# Patient Record
Sex: Female | Born: 1985
Health system: Southern US, Community
[De-identification: ages and names within clinical notes are randomized; demographics above are authoritative.]

## PROBLEM LIST (undated history)

## (undated) DIAGNOSIS — R12 Heartburn: Secondary | ICD-10-CM

## (undated) DIAGNOSIS — G43909 Migraine, unspecified, not intractable, without status migrainosus: Secondary | ICD-10-CM

## (undated) DIAGNOSIS — M549 Dorsalgia, unspecified: Secondary | ICD-10-CM

## (undated) DIAGNOSIS — K760 Fatty (change of) liver, not elsewhere classified: Secondary | ICD-10-CM

## (undated) DIAGNOSIS — T783XXA Angioneurotic edema, initial encounter: Secondary | ICD-10-CM

## (undated) DIAGNOSIS — L509 Urticaria, unspecified: Secondary | ICD-10-CM

## (undated) DIAGNOSIS — O139 Gestational [pregnancy-induced] hypertension without significant proteinuria, unspecified trimester: Secondary | ICD-10-CM

## (undated) DIAGNOSIS — S8290XA Unspecified fracture of unspecified lower leg, initial encounter for closed fracture: Secondary | ICD-10-CM

## (undated) DIAGNOSIS — R112 Nausea with vomiting, unspecified: Secondary | ICD-10-CM

## (undated) DIAGNOSIS — Z9889 Other specified postprocedural states: Secondary | ICD-10-CM

## (undated) HISTORY — DX: Morbid (severe) obesity due to excess calories: E66.01

## (undated) HISTORY — DX: Heartburn: R12

## (undated) HISTORY — DX: Fatty (change of) liver, not elsewhere classified: K76.0

## (undated) HISTORY — DX: Urticaria, unspecified: L50.9

## (undated) HISTORY — DX: Angioneurotic edema, initial encounter: T78.3XXA

## (undated) HISTORY — DX: Dorsalgia, unspecified: M54.9

## (undated) HISTORY — DX: Migraine, unspecified, not intractable, without status migrainosus: G43.909

## (undated) HISTORY — DX: Unspecified fracture of unspecified lower leg, initial encounter for closed fracture: S82.90XA

---
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
## 2006-10-09 ENCOUNTER — Emergency Department: Payer: Self-pay | Admitting: Emergency Medicine

## 2006-10-09 IMAGING — CR DG CHEST 2V
1 series · 2 of 2 positions shown · non-contrast
Comparison: none

REASON FOR EXAM: Cough, fever
COMMENTS:  LMP: 2 weeks

PROCEDURE:     DXR - DXR CHEST PA (OR AP) AND LATERAL  - [DATE]  [DATE]
RESULT:     The lungs are clear.  Cardiovascular structures are
unremarkable.

[Series 1: view not recorded · 0.17mm/px · 2 of 2 slices shown]
[im 1/2]
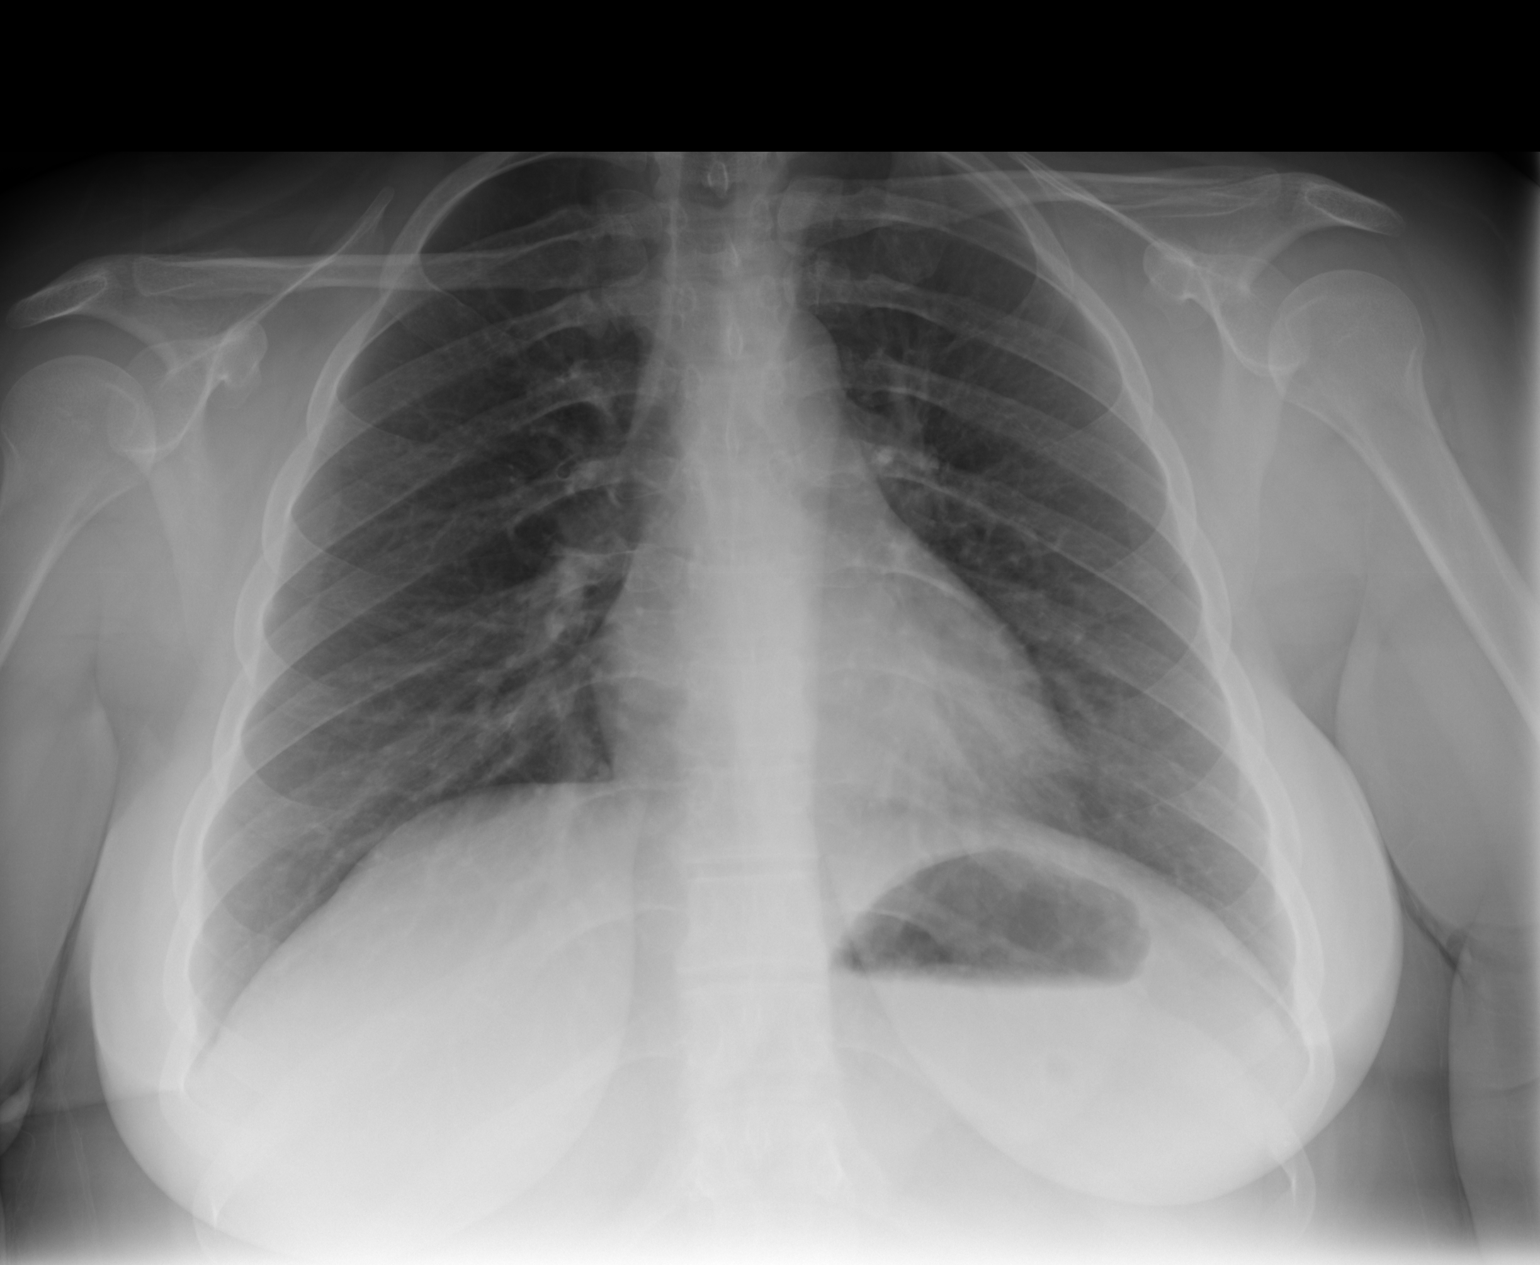
[im 2/2]
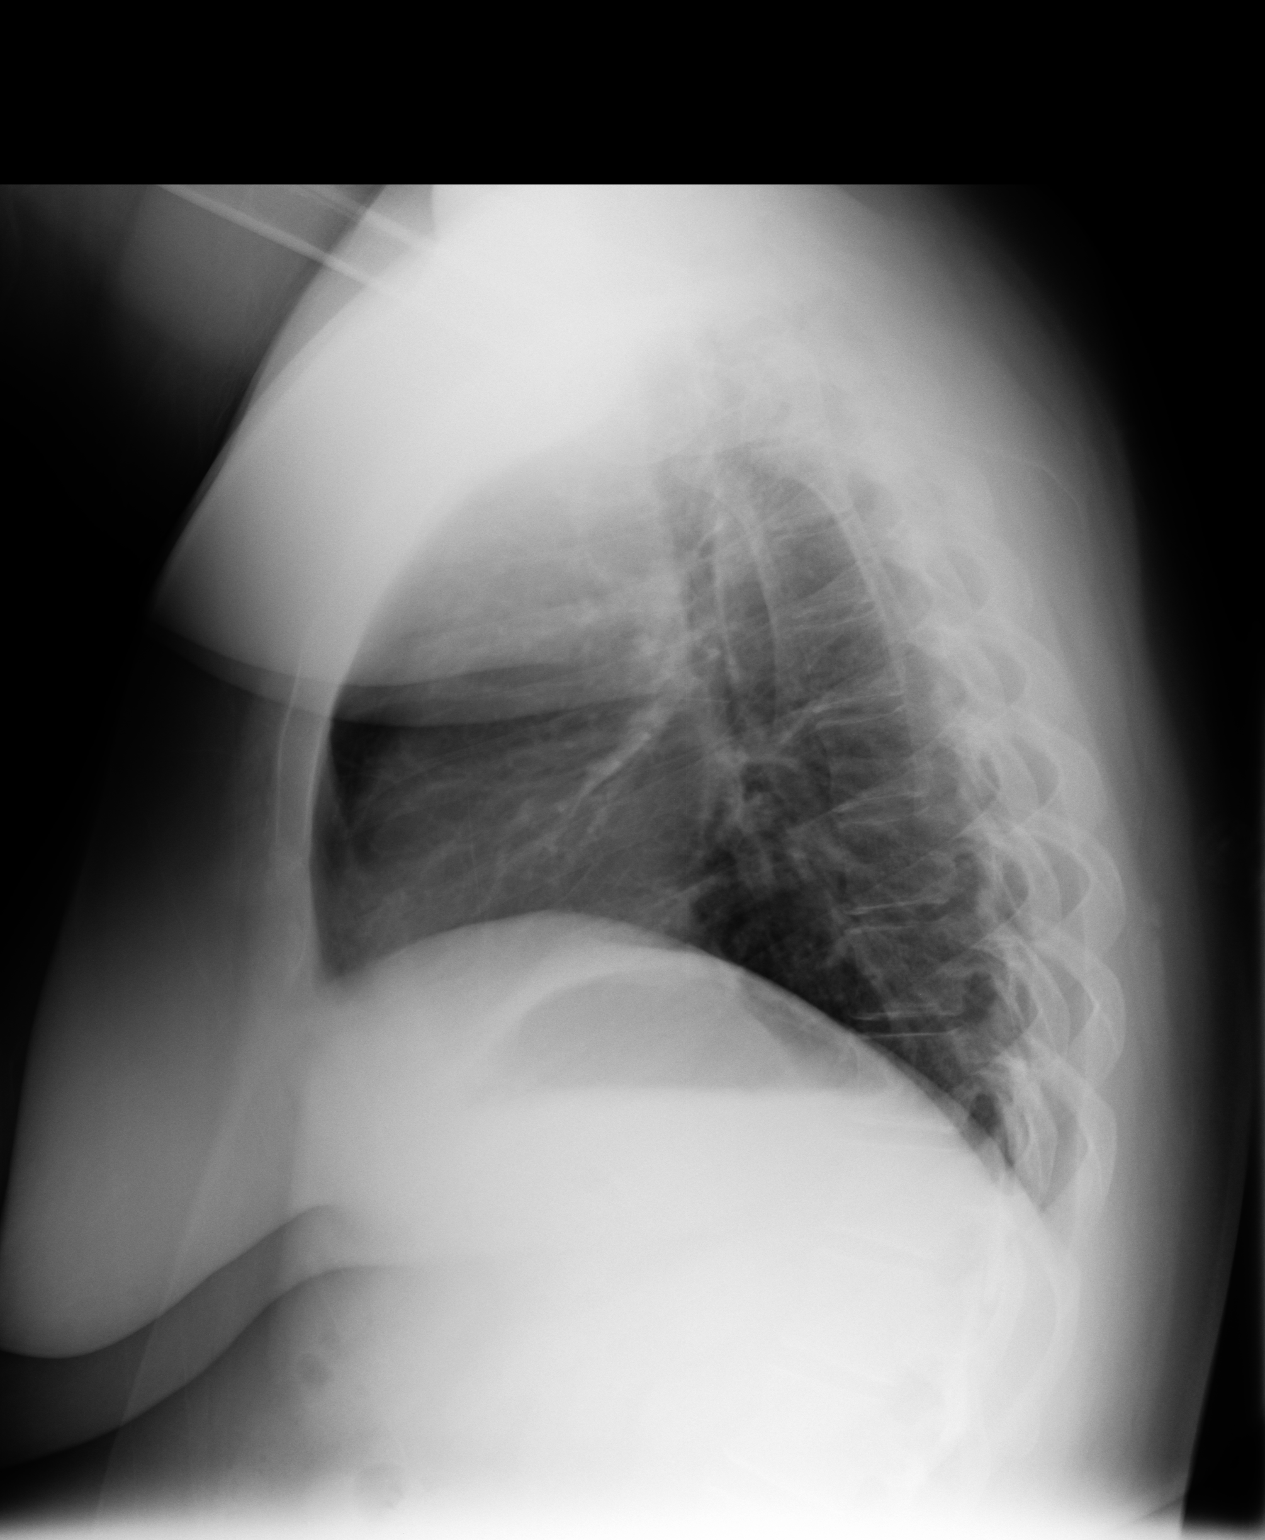

[2 of 2 positions shown; findings below may reference images not displayed]

IMPRESSION: No acute cardiopulmonary disease.

## 2007-09-01 ENCOUNTER — Encounter: Admission: RE | Admit: 2007-09-01 | Discharge: 2007-09-01 | Payer: Self-pay | Admitting: Obstetrics and Gynecology

## 2009-07-19 ENCOUNTER — Emergency Department (HOSPITAL_COMMUNITY): Admission: EM | Admit: 2009-07-19 | Discharge: 2009-07-19 | Payer: Self-pay | Admitting: Family Medicine

## 2010-07-21 HISTORY — PX: CHOLECYSTECTOMY: SHX55

## 2010-12-06 NOTE — Op Note (Signed)
[place]  Patient:    Alisha Wood, Alisha Wood Visit Number: [id number] MRN: [id number]          Service Type: PED Location: PEDS 6151 01 Attending Physician:  Carlos Levering Dictated by:   Hyman Bible Pendse, M.D. Proc. Date: 08/16/01 Admit Date:  08/15/2001   CC:         [facility]   Operative Report  PREOPERATIVE DIAGNOSIS:  Acute appendicitis.  POSTOPERATIVE DIAGNOSIS:  Acute appendicitis.  OPERATION PERFORMED: 1. Exploratory laparotomy. 2. Appendectomy.  SURGEON:  Prabhakar D. Levie Heritage, M.D.  ASSISTANT:  Nurse.  ANESTHESIA:  Nurse.  INDICATIONS FOR PROCEDURE:  The patient is a 25 year old girl who was seen with 24 hours history of progressively worse lower abdominal pains associated with nausea, anorexia, no upper respiratory infection, no dysuria, no diarrhea.  White blood cell count was 10,800; however, CT scan of the abdomen showed findings consistent with acute appendicitis.  Exploration was planned.  OPERATIVE FINDINGS:  Upon opening the right lower quadrant area, there was a moderate amount of serous fluid in the right lower quadrant area.  With difficulty, the appendix was exteriorized which was about 4 inches long with its distal one inch somewhat congested suggestive of early appendicitis and no other pathological changes were seen.  DESCRIPTION OF PROCEDURE:  Under satisfactory general endotracheal anesthesia, patient in supine position, the abdomen was thoroughly prepped and draped in the usual manner.  About a 3-inch long transverse incision was made in the right lower quadrant area.  The patient was quite obese, so there was about a 2 inch thickness of the subcutaneous tissues which was incised.  Muscles were incised in the McBurney fashion.  Peritoneal cavity entered with difficulty. The cecum and the appendix were identified.  The findings were as described above.  The appendix  mesentery was serially clamped, cut and ligated with 2-0 silk.  Appendectomy was done.  The stump was suture ligated with 3-0 silk and 2 Hemoclips were placed.  Stump was not buried in the cecal wall.  Hemostasis was satisfactory.  Cecum returned to the peritoneal cavity.  Irrigation was carried out.  Hemostasis being satisfactory and sponge and needle count being correct, peritoneum closed with 2-0 Vicryl running interlocking sutures. Wound was irrigated, individual muscles with 2-0 Vicryl interrupted sutures, subcutaneous tissues with 2-0 chromic interrupted sutures.  Skin closed with 2-0 Monocryl subcuticular sutures.  0.25% Marcaine with epinephrine was injected locally for postoperative analgesia.  Steri-Strips were applied. Appropriate dressing applied.  Throughout the procedure, the patients vital signs remained stable.  The patient withstood the procedure well and was transferred to the recovery room in satisfactory general condition. Dictated by:   Hyman Bible Pendse, M.D. Attending Physician:  Carlos Levering DD:  08/16/01 TD:  08/16/01 Job: [id number]

## 2011-01-06 ENCOUNTER — Other Ambulatory Visit (INDEPENDENT_AMBULATORY_CARE_PROVIDER_SITE_OTHER): Payer: Self-pay | Admitting: General Surgery

## 2011-01-06 ENCOUNTER — Ambulatory Visit (HOSPITAL_COMMUNITY)
Admission: RE | Admit: 2011-01-06 | Discharge: 2011-01-06 | Disposition: A | Discharge status: Home / Self Care | Payer: 59 | Source: Ambulatory Visit | Attending: General Surgery | Admitting: General Surgery

## 2011-01-06 DIAGNOSIS — K8020 Calculus of gallbladder without cholecystitis without obstruction: Secondary | ICD-10-CM | POA: Insufficient documentation

## 2011-01-06 DIAGNOSIS — R1011 Right upper quadrant pain: Secondary | ICD-10-CM | POA: Insufficient documentation

## 2011-01-06 IMAGING — US US ABDOMEN COMPLETE
1 series · 14 of 25 positions shown · non-contrast
Comparison: [DATE]

CLINICAL DATA: Right upper quadrant pain

COMPLETE ABDOMINAL ULTRASOUND

[Series 1: us abdomen complete · 0.30mm/px · 14 of 70 slices shown]
[im 1/70]
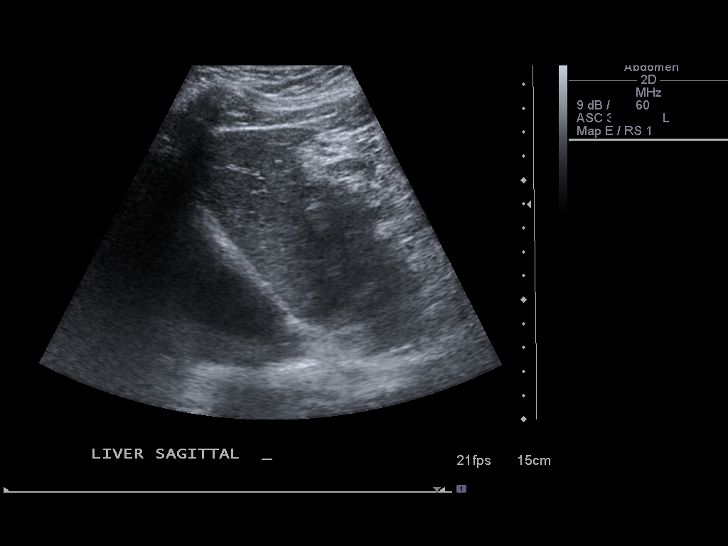
[im 6/70]
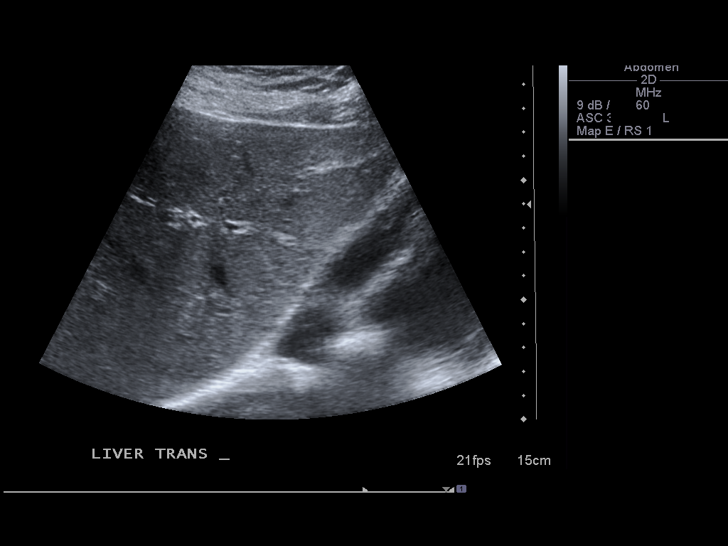
[im 12/70]
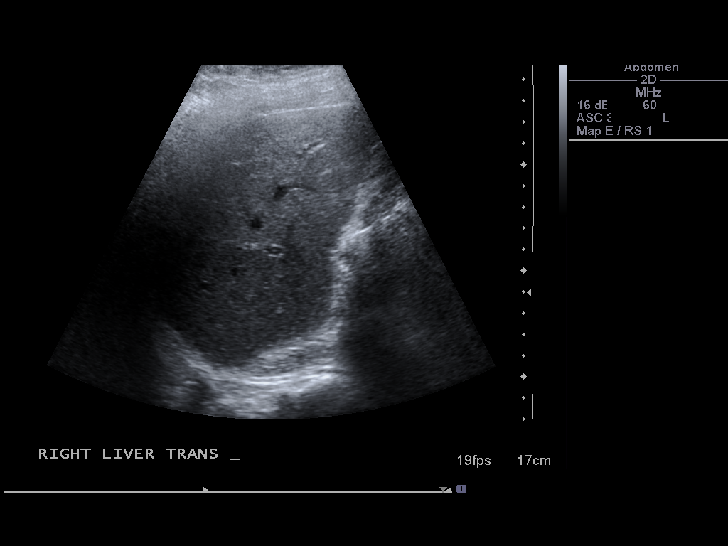
[im 18/70]
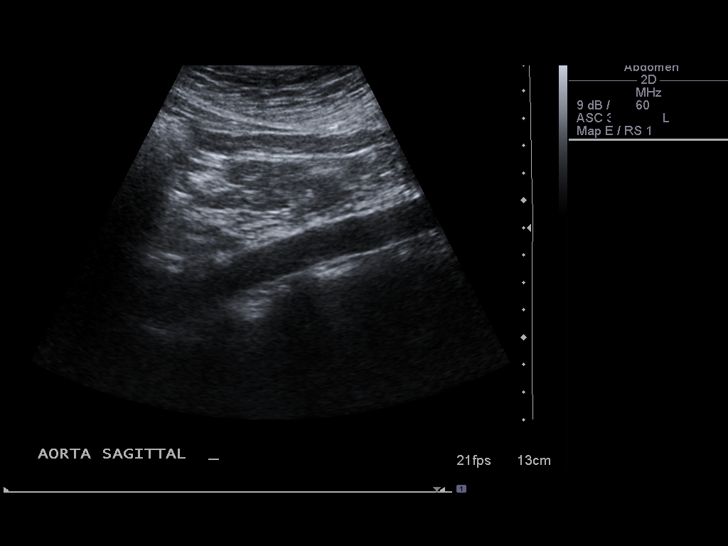
[im 24/70]
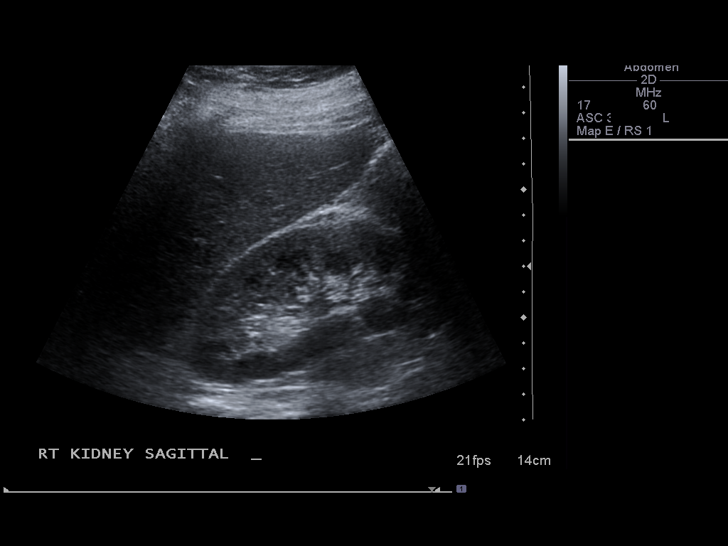
[im 26/70]
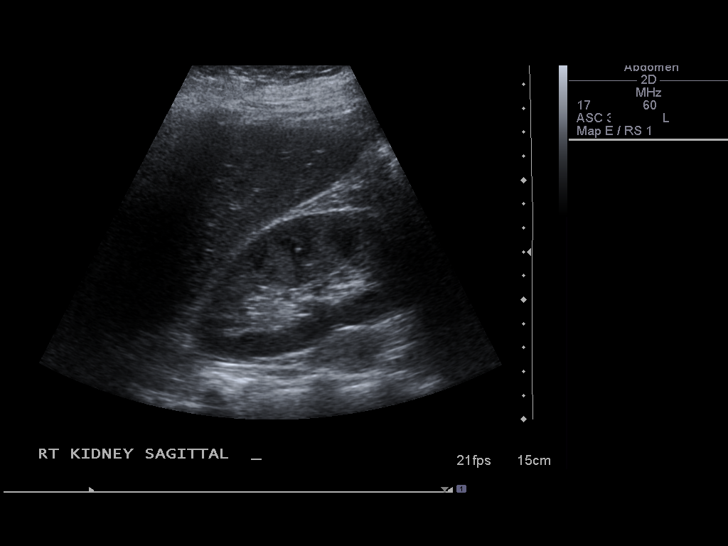
[im 32/70]
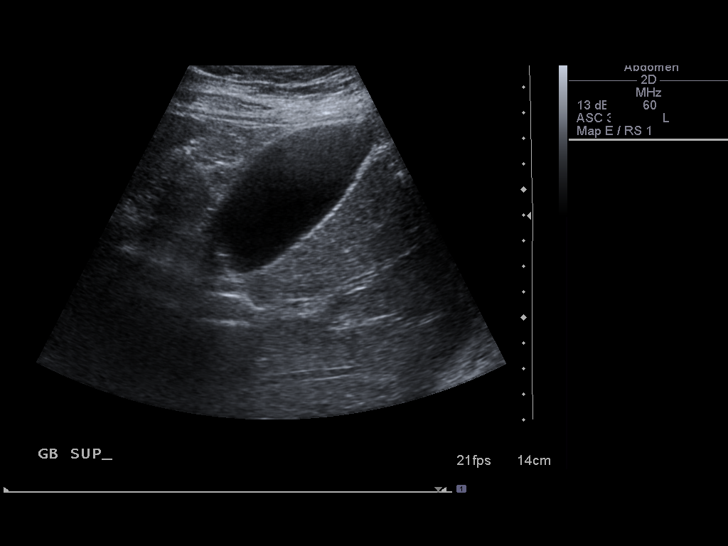
[im 38/70]
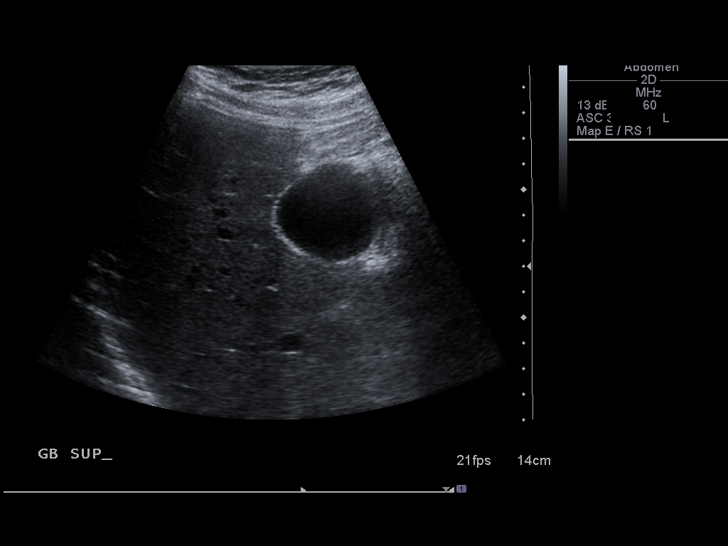
[im 44/70]
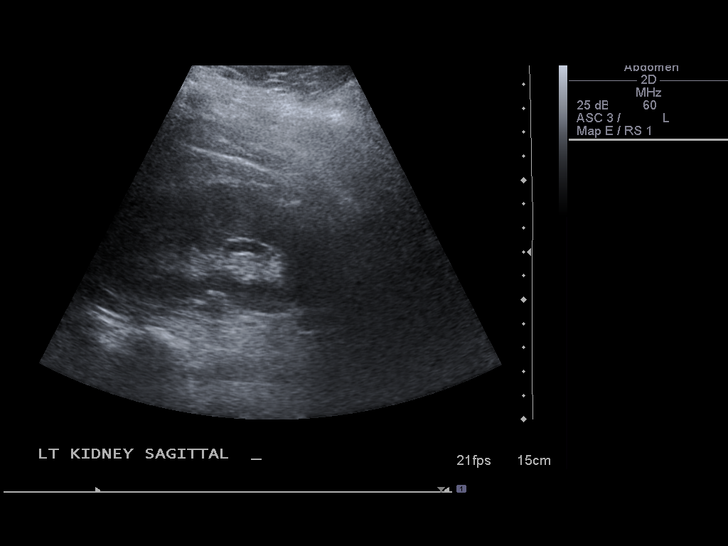
[im 47/70]
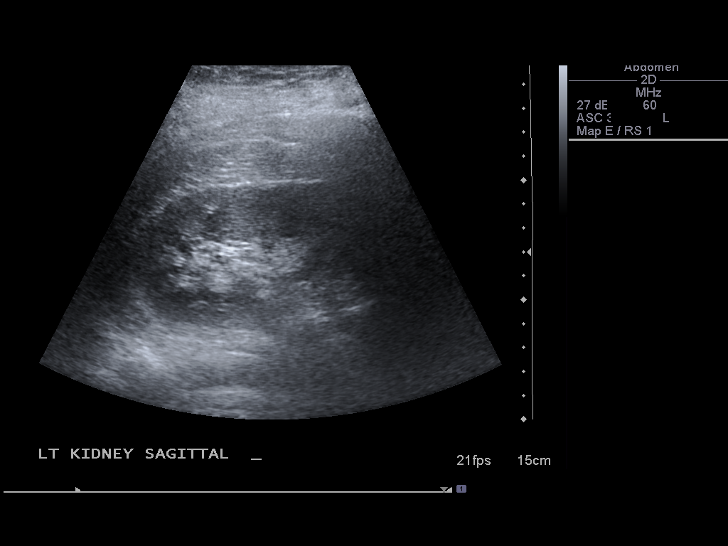
[im 52/70]
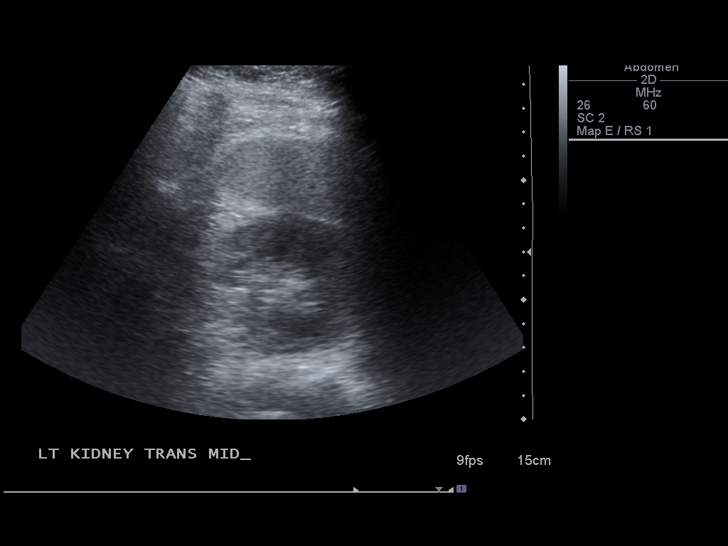
[im 58/70]
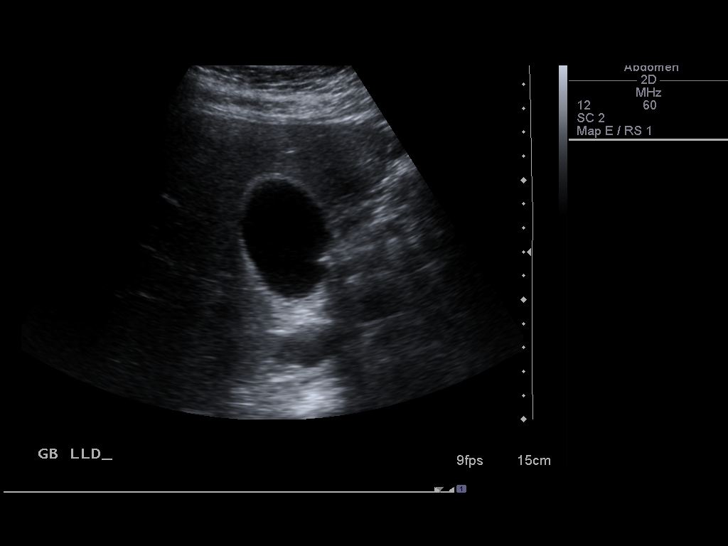
[im 64/70]
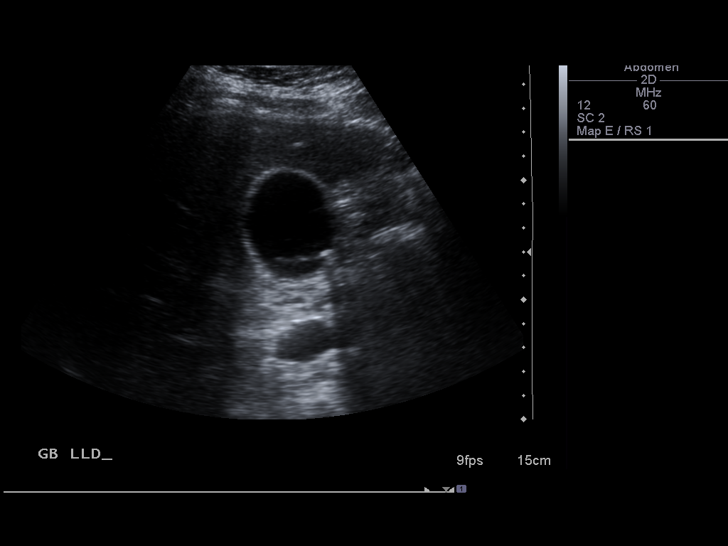
[im 70/70]
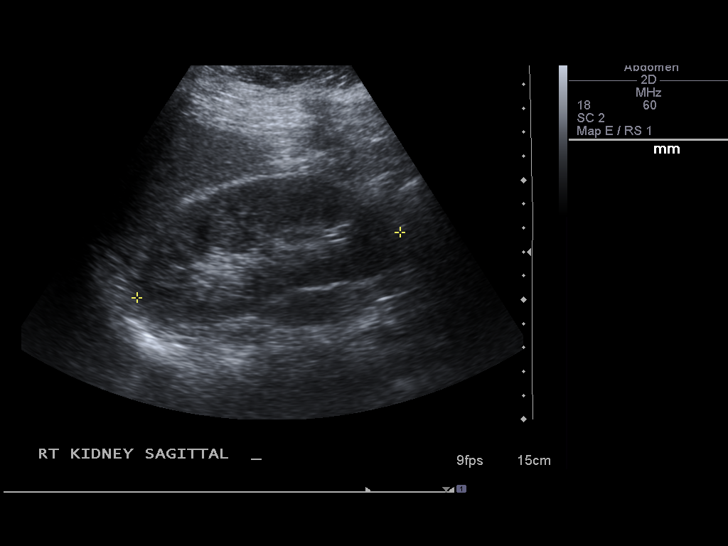

[14 of 25 positions shown; findings below may reference images not displayed]

FINDINGS: Gallbladder:  There are multiple small gallstones.  No gallbladder
wall thickening or edema.  Negative sonographic Murphy's sign.

Common bile duct:  4 mm

Liver:  No focal lesion identified.  Within normal limits in
parenchymal echogenicity.

IVC:  Normal

Pancreas:  Normal

Spleen:  Normal

Right Kidney:  8.3 cm in length.  Normal.

Left Kidney:  12.6 cm in length.  Normal.

Abdominal aorta:  Normal
IMPRESSION: 1.  Cholelithiasis without sonographic evidence for acute
cholecystitis.
2.  Otherwise normal.

## 2011-01-12 ENCOUNTER — Other Ambulatory Visit (INDEPENDENT_AMBULATORY_CARE_PROVIDER_SITE_OTHER): Payer: Self-pay | Admitting: General Surgery

## 2011-01-12 NOTE — Progress Notes (Deleted)
Subjective:    Patient ID: Alisha Wood is a 25 y.o. female.  There were no vitals taken for this visit.   Chief Complaint: HPI {Common ambulatory SmartLinks:19316} Review of Systems  Objective:  Physical Exam  Constitutional: She appears well-nourished.  HENT:  Head: Normocephalic and atraumatic.  Eyes: Pupils are equal, round, and reactive to light.    Lab Review:  {Recent labs:19471::"not applicable"}  Assessment:  Assessment Diagnosis No diagnosis found.   Plan:  Plan ***

## 2011-01-16 ENCOUNTER — Encounter (INDEPENDENT_AMBULATORY_CARE_PROVIDER_SITE_OTHER): Payer: Self-pay | Admitting: General Surgery

## 2011-01-16 ENCOUNTER — Ambulatory Visit (INDEPENDENT_AMBULATORY_CARE_PROVIDER_SITE_OTHER): Payer: 59 | Admitting: General Surgery

## 2011-01-16 VITALS — BP 136/84 | HR 88 | Ht 62.0 in | Wt 202.6 lb

## 2011-01-16 DIAGNOSIS — K8010 Calculus of gallbladder with chronic cholecystitis without obstruction: Secondary | ICD-10-CM | POA: Insufficient documentation

## 2011-01-16 NOTE — Progress Notes (Signed)
Subjective:     Patient ID: Alisha Wood, female   DOB: Aug 01, 1985, 25 y.o.   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  Saw the patient home on normal rounds at the hospital when she the synovitis concerning abdominal pain she had been having in her epigastrium upper back and right upper quadrant. A rolled her to get an ultrasound examination done of the abdomen which demonstrated cholelithiasis but no evidence of acute cholecystitis. She comes in now for evaluation for cholecystectomy  Review of Systems  Constitutional: Negative.   HENT: Negative.   Gastrointestinal: Positive for abdominal pain and diarrhea.       Objective:   Physical Exam  Constitutional: She is oriented to person, place, and time. She appears well-developed and well-nourished.  HENT:  Head: Normocephalic and atraumatic.  Eyes: EOM are normal. Pupils are equal, round, and reactive to light.  Neck: Normal range of motion. Neck supple.  Cardiovascular: Normal rate and regular rhythm.   Murmur (Grade I at LLSB) heard. Pulmonary/Chest: Effort normal and breath sounds normal.  Abdominal: Soft. There is tenderness (Epigastric and RUQ).  Musculoskeletal: Normal range of motion.  Neurological: She is alert and oriented to person, place, and time. She has normal reflexes.  Psychiatric: She has a normal mood and affect. Her behavior is normal. Judgment and thought content normal.       Assessment:    This is a 25 year old with symptomatic cholelithiasis. She does not have acute cholecystitis    Plan:     We will go ahead and schedule the patient for laparoscopic cholecystectomy. The risks and benefits have been explained to the patient and she understands we will proceed to surgery soon as possible she will get the normal preoperative  antibiotics and likely done as an outpatient.

## 2011-01-30 ENCOUNTER — Ambulatory Visit (INDEPENDENT_AMBULATORY_CARE_PROVIDER_SITE_OTHER): Payer: Self-pay | Admitting: General Surgery

## 2011-01-31 ENCOUNTER — Other Ambulatory Visit (HOSPITAL_COMMUNITY): Payer: 59

## 2011-01-31 ENCOUNTER — Encounter (HOSPITAL_COMMUNITY)
Admission: RE | Admit: 2011-01-31 | Discharge: 2011-01-31 | Disposition: A | Discharge status: Home / Self Care | Payer: 59 | Source: Ambulatory Visit | Attending: General Surgery | Admitting: General Surgery

## 2011-01-31 LAB — HCG, SERUM, QUALITATIVE: Preg, Serum: NEGATIVE

## 2011-01-31 LAB — DIFFERENTIAL
Basophils Relative: 0 % (ref 0–1)
Eosinophils Absolute: 0.1 10*3/uL (ref 0.0–0.7)
Eosinophils Relative: 1 % (ref 0–5)
Lymphs Abs: 1.9 10*3/uL (ref 0.7–4.0)
Monocytes Absolute: 0.4 10*3/uL (ref 0.1–1.0)
Monocytes Relative: 6 % (ref 3–12)

## 2011-01-31 LAB — CBC
MCH: 31.7 pg (ref 26.0–34.0)
MCHC: 35.1 g/dL (ref 30.0–36.0)
MCV: 90.4 fL (ref 78.0–100.0)
Platelets: 210 10*3/uL (ref 150–400)
RDW: 12.9 % (ref 11.5–15.5)

## 2011-01-31 LAB — COMPREHENSIVE METABOLIC PANEL
ALT: 17 U/L (ref 0–35)
AST: 14 U/L (ref 0–37)
Alkaline Phosphatase: 46 U/L (ref 39–117)
GFR calc Af Amer: >60 mL/min (ref >60)
Glucose, Bld: 82 mg/dL (ref 70–99)
Potassium: 4.5 mEq/L (ref 3.5–5.1)
Sodium: 139 mEq/L (ref 135–145)
Total Protein: 7.1 g/dL (ref 6.0–8.3)

## 2011-02-03 ENCOUNTER — Ambulatory Visit (HOSPITAL_COMMUNITY)
Admission: RE | Admit: 2011-02-03 | Discharge: 2011-02-03 | Disposition: A | Discharge status: Home / Self Care | Payer: 59 | Source: Ambulatory Visit | Attending: General Surgery | Admitting: General Surgery

## 2011-02-03 ENCOUNTER — Other Ambulatory Visit (INDEPENDENT_AMBULATORY_CARE_PROVIDER_SITE_OTHER): Payer: Self-pay | Admitting: General Surgery

## 2011-02-03 ENCOUNTER — Ambulatory Visit (HOSPITAL_COMMUNITY): Payer: 59

## 2011-02-03 DIAGNOSIS — Z01812 Encounter for preprocedural laboratory examination: Secondary | ICD-10-CM | POA: Insufficient documentation

## 2011-02-03 DIAGNOSIS — K8010 Calculus of gallbladder with chronic cholecystitis without obstruction: Secondary | ICD-10-CM

## 2011-02-03 DIAGNOSIS — M26609 Unspecified temporomandibular joint disorder, unspecified side: Secondary | ICD-10-CM | POA: Insufficient documentation

## 2011-02-03 IMAGING — RF DG CHOLANGIOGRAM OPERATIVE
1 series · 4 of 4 positions shown · non-contrast
Comparison: None

CLINICAL DATA: Cholelithiasis

INTRAOPERATIVE CHOLANGIOGRAM
TECHNIQUE: Cholangiographic images from the C-arm fluoroscopic
device were submitted for interpretation post-operatively.  Please
see the procedural report for the amount of contrast and the
fluoroscopy time utilized.

[Series 1: run · 4 of 113 frames shown]
[frame 17/113]
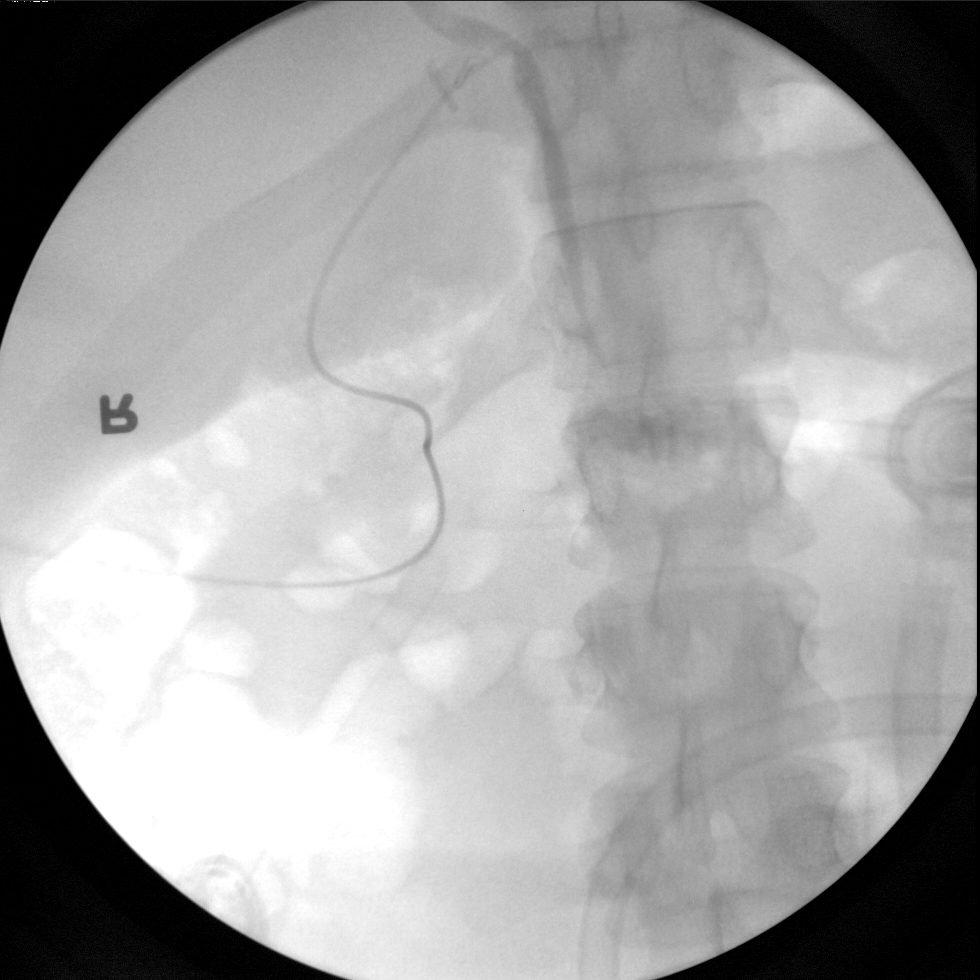
[frame 57/113]
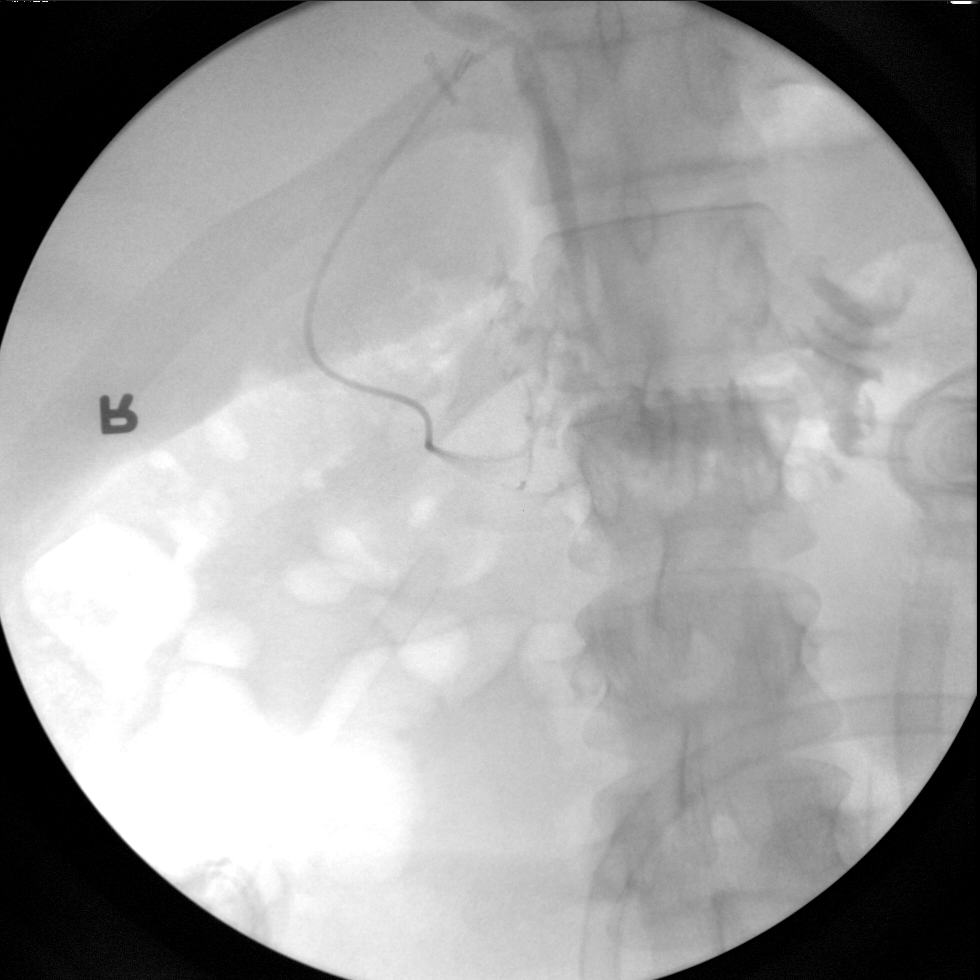
[frame 97/113]
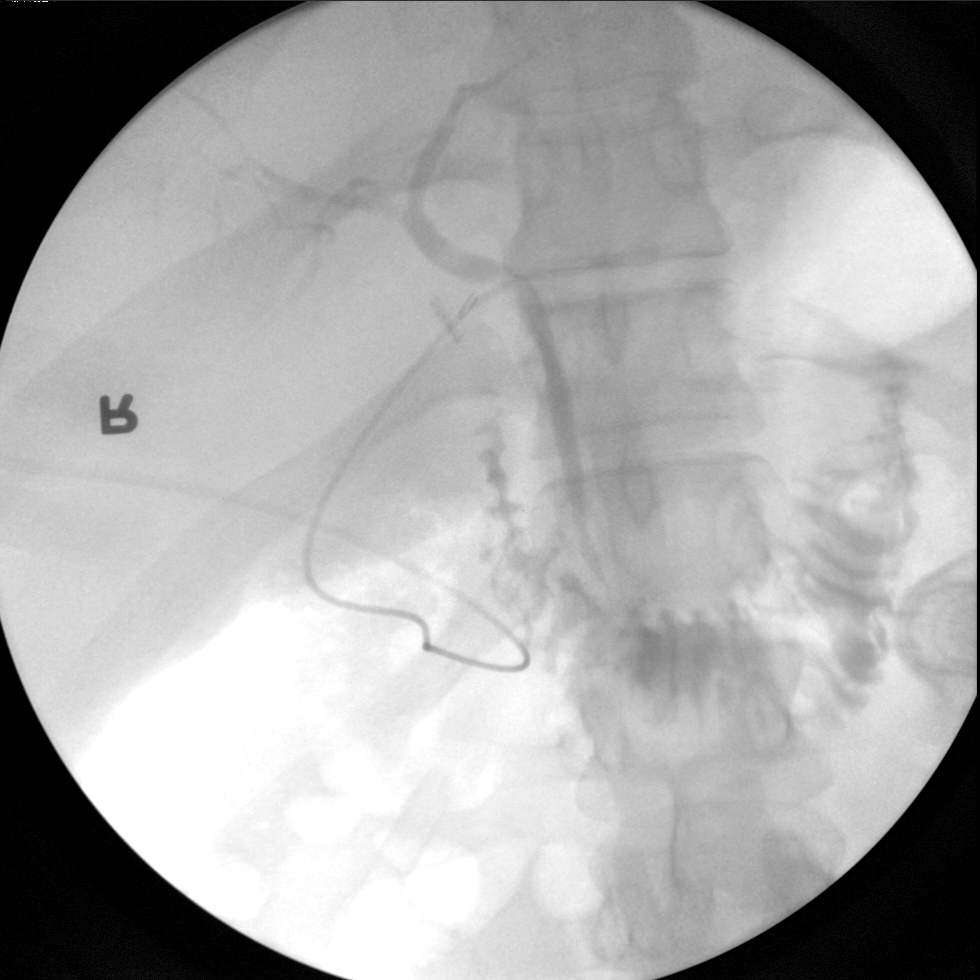
[frame 109/113]
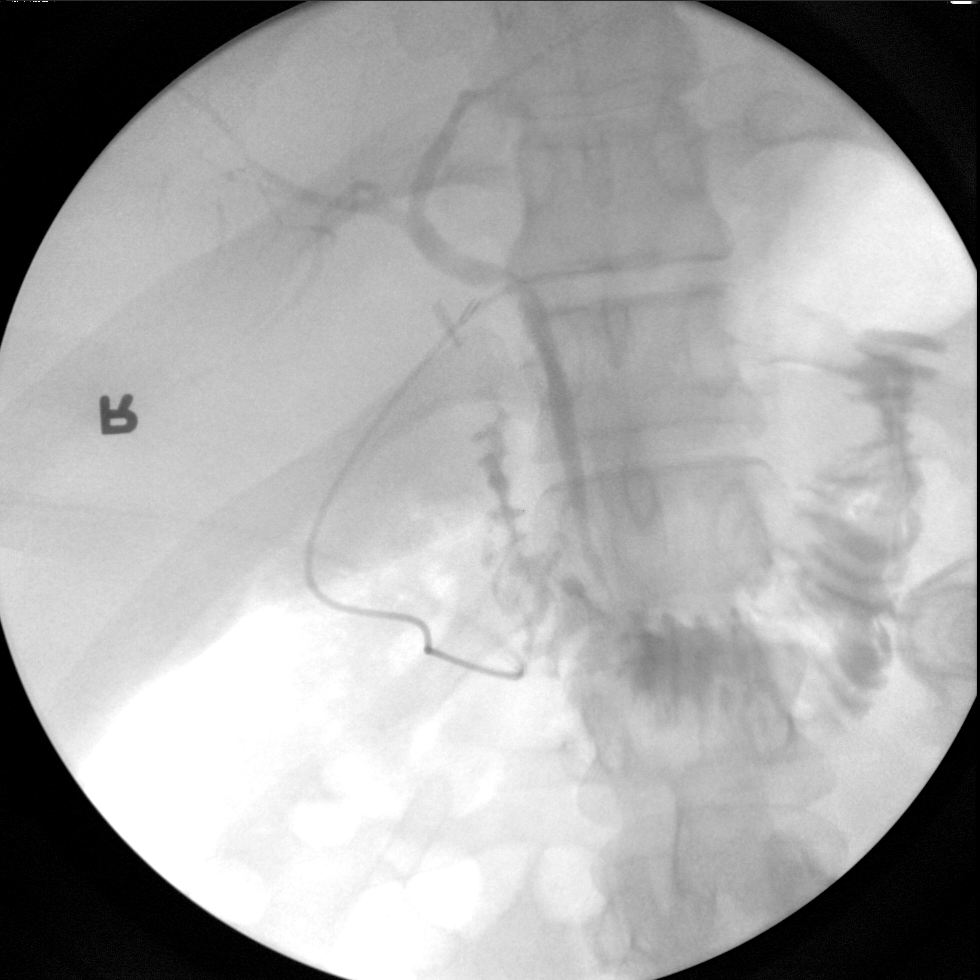

[4 of 4 positions shown; findings below may reference images not displayed]

FINDINGS: No persistent filling defects in the common duct.
Intrahepatic ducts are incompletely visualized, appearing
decompressed centrally. Contrast passes into the duodenum.

IMPRESSION

Negative for retained common duct stone.

## 2011-02-05 ENCOUNTER — Other Ambulatory Visit (INDEPENDENT_AMBULATORY_CARE_PROVIDER_SITE_OTHER): Payer: Self-pay | Admitting: General Surgery

## 2011-02-05 DIAGNOSIS — R110 Nausea: Secondary | ICD-10-CM

## 2011-02-07 NOTE — Op Note (Signed)
NAMEMarland Kitchen  VUNG, KUSH NO.:  [id number]  MEDICAL RECORD NO.:  [id number]  LOCATION:  [facility]                         FACILITY:  [facility]  PHYSICIAN:  Cherylynn Ridges, M.D.    DATE OF BIRTH:  01/26/1986  DATE OF PROCEDURE:  02/03/2011 DATE OF DISCHARGE:  02/03/2011                              OPERATIVE REPORT   PREOPERATIVE DIAGNOSIS:  Symptomatic cholelithiasis.  POSTOPERATIVE DIAGNOSIS:  Symptomatic cholelithiasis with chronic cholecystitis.  PROCEDURE:  Laparoscopic cholecystectomy with cholangiogram.  SURGEON:  Marta Lamas. Lindie Spruce, MD  ASSISTANT:  Magnus Ivan, RNFA  ANESTHESIA:  General endotracheal.  ESTIMATED BLOOD LOSS:  Less than 20 mL.  COMPLICATIONS:  None.  CONDITION:  Stable.  FINDINGS:  Normal intraoperative cholangiogram with evidence of chronic cholecystitis.  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  OPERATION IN DETAIL:  The patient was taken to the operating room and placed on the table in supine position.  After an adequate general endotracheal anesthetic was administered, she was prepped and draped in the usual sterile manner exposing the entire abdomen.  After proper time-out was performed identifying the patient and the procedure to be performed, a supraumbilical midline incision was made using #15 blade and taken down to the midline fascia.  We grabbed the fascia with Kocher clamps and then incised between the clamps using a #15 blade down to the preperitoneal space.  We then bluntly dissected down into the peritoneal cavity using a Kelly clamp.  We grabbed the edges of the fascia with Kocher clamps and passed a pursestring suture around the fascial opening and then passed a Hasson  cannula into the peritoneal cavity.  Once this was done, carbon dioxide gas was insufflated into the peritoneal cavity up to maximal pressure of 50 mmHg.  The patient was placed in reverse Trendelenburg and the left-side was tilted down.  Two right upper quadrant 5-mm cannulas and a subxiphoid 5- mm cannula passed under direct vision.  With all cannulas in place, the dissection was begun.  We retracted the gallbladder towards the anterior abdominal wall and right upper quadrant.  We opened up the peritoneum overlying the triangle of Calot and hepatic duodenal triangle using a second grasper on the infundibulum.  We were able to get a circumferential window around the cystic duct and the cystic artery.  We clipped along the gallbladder side at the cystic duct.  We made cholecystodochotomy using laparoscopic scissors.  It was through that cholecystodochotomy that a cholangiogram was performed using a Cook catheter which had been passed through the anterior abdominal wall.  The cholangiogram showed good flow into the duodenum, no intraductal filling defects, no ductal dilatation, and good proximal filling.  Once cholangiogram was completed, the clip holding the cholangiogram and the catheter in place was removed, removed the clip and distally clipped the cystic duct x2.  We transected the cystic duct, found our cystic artery which was circumferentially dissected around and clipped it proximally and distally and transected it.  Once this was completed, we dissected  out the gallbladder from its bed with minimal difficulty. Once there was entrance into the gallbladder with spillage of bile, we were able to retrieve the gallbladder with minimal difficulty from the umbilical site.  All counts were correct at that point.  We aspirated all fluid and gas from above the liver.  We closed off the supraumbilical fascial site using a pursestring suture which was in place.  Once that was done, we  irrigated with saline solution, aspirated all fluid and gas, and then closed again with the fascia being closed using a pursestring suture in place.  We injected 0.25% Marcaine with epinephrine at all sites.  We closed the supraumbilical skin using running subcuticular stitch of 4-0 Monocryl.  Once that was done, all of the incisions were closed with Dermabond and Steri-Strips and Tegaderm. All needle, sponge counts, and instrument counts were correct.     Cherylynn Ridges, M.D.     JOW/MEDQ  D:  02/03/2011  T:  02/04/2011  Job:  [id number]  Electronically Signed by Jimmye Norman M.D. on 02/07/2011 12:00:48 AM

## 2011-02-11 ENCOUNTER — Ambulatory Visit (INDEPENDENT_AMBULATORY_CARE_PROVIDER_SITE_OTHER): Payer: 59 | Admitting: General Surgery

## 2011-02-11 ENCOUNTER — Encounter (INDEPENDENT_AMBULATORY_CARE_PROVIDER_SITE_OTHER): Payer: Self-pay | Admitting: General Surgery

## 2011-02-11 DIAGNOSIS — Z09 Encounter for follow-up examination after completed treatment for conditions other than malignant neoplasm: Secondary | ICD-10-CM | POA: Insufficient documentation

## 2011-02-11 NOTE — Progress Notes (Signed)
HPI The patient is status post laparoscopic cholecystectomy. Her main complaint has been nausea but no vomiting. Today she feels better she had some surgery.  PE Wounds are healing well. There is still cover were plastic and Steri-Strips. She can remove them at her discretion.  Studiy review There are no studies to review.  Assessment Doing better status post laparoscopic cholecystectomy.  Plan She'll return to work on Monday without any heavy lifting for an additional 2 weeks.

## 2011-02-28 MED ORDER — PROMETHAZINE HCL 12.5 MG PO TABS: 12.5000 mg | ORAL_TABLET | ORAL | Status: AC | PRN

## 2011-02-28 NOTE — Progress Notes (Signed)
Addended by: Wilder Glade on: 02/28/2011 02:49 PM   Modules accepted: Orders

## 2011-06-19 ENCOUNTER — Ambulatory Visit (INDEPENDENT_AMBULATORY_CARE_PROVIDER_SITE_OTHER): Payer: 59 | Admitting: Family Medicine

## 2011-06-19 ENCOUNTER — Encounter: Payer: Self-pay | Admitting: Family Medicine

## 2011-06-19 VITALS — BP 124/80 | HR 72 | Temp 98.8°F | Ht 62.0 in | Wt 202.2 lb

## 2011-06-19 DIAGNOSIS — G43909 Migraine, unspecified, not intractable, without status migrainosus: Secondary | ICD-10-CM

## 2011-06-19 DIAGNOSIS — Z0000 Encounter for general adult medical examination without abnormal findings: Secondary | ICD-10-CM

## 2011-06-19 LAB — BASIC METABOLIC PANEL
Calcium: 9.1 mg/dL (ref 8.4–10.5)
Chloride: 109 mEq/L (ref 96–112)
Creatinine, Ser: 0.6 mg/dL (ref 0.4–1.2)
GFR: 123.38 mL/min (ref >60.00)

## 2011-06-19 LAB — LIPID PANEL
HDL: 47.3 mg/dL (ref >39.00)
LDL Cholesterol: 127 mg/dL — ABNORMAL HIGH (ref 0–99)
Total CHOL/HDL Ratio: 4
Triglycerides: 68 mg/dL (ref 0.0–149.0)
VLDL: 13.6 mg/dL (ref 0.0–40.0)
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
## 2011-06-19 NOTE — Patient Instructions (Addendum)
Bring copy of immunization record. Blood work today (cholesterol and sugar). Good to meet you today, call us with questions. Return in 1-2 years for follow up or as needed.

## 2011-06-19 NOTE — Assessment & Plan Note (Signed)
Reviewed preventative protocols, updated. Pt to bring me copy of immunization record for chart. Currently planning to restart weight watchers after holiday. Has lost 50+ lbs in last year. Discussed healthy diet.

## 2012-08-05 ENCOUNTER — Telehealth: Payer: Self-pay

## 2012-08-05 DIAGNOSIS — IMO0001 Reserved for inherently not codable concepts without codable children: Secondary | ICD-10-CM

## 2012-08-05 NOTE — Telephone Encounter (Signed)
Placed order in chart.  Will take 3-5 days to return. Does she need Hep B and MMR titers?

## 2012-08-05 NOTE — Telephone Encounter (Signed)
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

## 2012-08-05 NOTE — Telephone Encounter (Signed)
Patient notified. She is still trying to get records from previous physician. They are in storage. She will call to schedule lab appt if she is unable to get records. She does not need Hep B or MMR titers.

## 2012-08-30 ENCOUNTER — Ambulatory Visit (INDEPENDENT_AMBULATORY_CARE_PROVIDER_SITE_OTHER): Payer: 59 | Admitting: Family Medicine

## 2012-08-30 ENCOUNTER — Encounter: Payer: Self-pay | Admitting: Family Medicine

## 2012-08-30 VITALS — BP 126/78 | HR 88 | Temp 98.4°F | Wt 246.0 lb

## 2012-08-30 DIAGNOSIS — E669 Obesity, unspecified: Secondary | ICD-10-CM

## 2012-08-30 DIAGNOSIS — L989 Disorder of the skin and subcutaneous tissue, unspecified: Secondary | ICD-10-CM

## 2012-08-30 DIAGNOSIS — D2320 Other benign neoplasm of skin of unspecified ear and external auricular canal: Secondary | ICD-10-CM | POA: Insufficient documentation

## 2012-08-30 HISTORY — DX: Morbid (severe) obesity due to excess calories: E66.01

## 2012-08-30 LAB — BASIC METABOLIC PANEL
Calcium: 9.1 mg/dL (ref 8.4–10.5)
GFR: 120.03 mL/min (ref >60.00)
Potassium: 3.8 mEq/L (ref 3.5–5.1)
Sodium: 136 mEq/L (ref 135–145)

## 2012-08-30 NOTE — Progress Notes (Signed)
  Subjective:    Patient ID: Alisha Wood, female    DOB: 1986-03-23, 27 y.o.   MRN: [id number]  HPI CC: spots on ears  Last night ears itch - noticed small bumps.  above bump are 2 black dots.  Husband took a look at them.  Spots not tender or itchy.  Would like these evaluated.  No recent injury to area or pencil pokes.  Sunscreen use discussed.  Tends to get sunburns. No personal h/o skin cancers. No fmhx skin problems.  Wt Readings from Last 3 Encounters:  08/30/12 246 lb (111.585 kg)  06/19/11 202 lb 4 oz (91.74 kg)  01/16/11 202 lb 9.6 oz (91.899 kg)  Obesity - weight gain noted in last year.  Since last seen here, married.  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  Stressed. +fmhx diabetes.  Past Medical History  Diagnosis Date  . Migraines     common and optic, controlled with alleve  . Leg fracture 1995    right     Review of Systems Per HPI    Objective:   Physical Exam  Nursing note and vitals reviewed. Constitutional: She appears well-developed and well-nourished. No distress.  Skin: Skin is warm and dry. No rash noted.  Small irritated dark spot on left pinna, smaller spot just distal.  No asymmetry.       Assessment & Plan:

## 2012-08-30 NOTE — Assessment & Plan Note (Signed)
Check fasting sugar today.  40lb weight gain noted. Discussed importance of increased activity and watching diet esp in setting of fmhx diabetes (father).

## 2012-08-30 NOTE — Patient Instructions (Signed)
overall it's looking ok.  I think it's a small mole. We will refer you to skin doctor to be sure.

## 2012-08-30 NOTE — Assessment & Plan Note (Signed)
Anticipate benign nevus - discussed this - will refer to derm for second look. Pt agrees with plan.

## 2012-08-31 ENCOUNTER — Encounter: Payer: Self-pay | Admitting: *Deleted

## 2012-09-09 ENCOUNTER — Other Ambulatory Visit: Payer: Self-pay

## 2012-09-12 ENCOUNTER — Encounter: Payer: Self-pay | Admitting: Family Medicine

## 2012-09-27 ENCOUNTER — Encounter: Payer: Self-pay | Admitting: Family Medicine

## 2012-10-06 ENCOUNTER — Other Ambulatory Visit: Payer: Self-pay

## 2012-12-21 ENCOUNTER — Encounter: Payer: Self-pay | Admitting: Family Medicine

## 2012-12-21 ENCOUNTER — Ambulatory Visit (INDEPENDENT_AMBULATORY_CARE_PROVIDER_SITE_OTHER): Payer: 59 | Admitting: Family Medicine

## 2012-12-21 VITALS — BP 124/82 | HR 84 | Temp 98.7°F | Wt 250.5 lb

## 2012-12-21 DIAGNOSIS — L502 Urticaria due to cold and heat: Secondary | ICD-10-CM

## 2012-12-21 NOTE — Assessment & Plan Note (Signed)
Anticipate heat induced urticaria with warmer weather recently - discussed. Recommended trial of antihistamine (H1 and H2). Update if spreading or worsening.

## 2012-12-21 NOTE — Patient Instructions (Addendum)
Take claritin during the day and zantac or pepcid at night. I think this is heat induced urticaria. May continue moisturizer and hydrocortisone cream for itchy spots. Hives Hives are itchy, red, swollen areas of the skin. They can vary in size and location on your body. Hives can come and go for hours or several days (acute hives) or for several weeks (chronic hives). Hives do not spread from person to person (noncontagious). They may get worse with scratching, exercise, and emotional stress. CAUSES   Allergic reaction to food, additives, or drugs.  Infections, including the common cold.  Illness, such as vasculitis, lupus, or thyroid disease.  Exposure to sunlight, heat, or cold.  Exercise.  Stress.  Contact with chemicals. SYMPTOMS   Red or white swollen patches on the skin. The patches may change size, shape, and location quickly and repeatedly.  Itching.  Swelling of the hands, feet, and face. This may occur if hives develop deeper in the skin. DIAGNOSIS  Your caregiver can usually tell what is wrong by performing a physical exam. Skin or blood tests may also be done to determine the cause of your hives. In some cases, the cause cannot be determined. TREATMENT  Mild cases usually get better with medicines such as antihistamines. Severe cases may require an emergency epinephrine injection. If the cause of your hives is known, treatment includes avoiding that trigger.  HOME CARE INSTRUCTIONS   Avoid causes that trigger your hives.  Take antihistamines as directed by your caregiver to reduce the severity of your hives. Non-sedating or low-sedating antihistamines are usually recommended. Do not drive while taking an antihistamine.  Take any other medicines prescribed for itching as directed by your caregiver.  Wear loose-fitting clothing.  Keep all follow-up appointments as directed by your caregiver. SEEK MEDICAL CARE IF:   You have persistent or severe itching that is  not relieved with medicine.  You have painful or swollen joints. SEEK IMMEDIATE MEDICAL CARE IF:   You have a fever.  Your tongue or lips are swollen.  You have trouble breathing or swallowing.  You feel tightness in the throat or chest.  You have abdominal pain. These problems may be the first sign of a life-threatening allergic reaction. Call your local emergency services (911 in [place] MAKE SURE YOU:   Understand these instructions.  Will watch your condition.  Will get help right away if you are not doing well or get worse. Document Released: 07/07/2005 Document Revised: 01/06/2012 Document Reviewed: 09/30/2011 [facility] Patient Information 2014 [place], [place].

## 2012-12-21 NOTE — Progress Notes (Signed)
  Subjective:    Patient ID: Alisha Wood, female    DOB: 1986/06/19, 27 y.o.   MRN: [id number]  HPI CC: L arm rash  Rash that comes and goes, recently worsening over the last month.  Initially pruritic then this resolves.  Never rough spots.  Over the last month waking up every morning with spots - on arms and R thigh.  Come and go.    Spares face and trunk, palms and soles.  H/o hives in HS, improved with zyrtec.  No new lotions, detergents, soaps, shampoos, no recent illness. No environmental allergies. No new meds.  No fevers, nausea, joint pains, oral lesions.  Has tried cetaphil, dove, OTC hydrocortisone cream and zyrtec.  No new foods.  Has had allergy testing 10 yrs ago, no specific triggering allergen found. Has noticed more prevalent around hot humid environment.  Usually clear up within 3 days.  Review of Systems Per HPI    Objective:   Physical Exam  Nursing note and vitals reviewed. Constitutional: She appears well-developed and well-nourished. No distress.  Skin: Skin is warm and dry. Rash noted.  Hives on L>R upper arm, smaller hives on left forearm No rough or scaly skin.  Some pruritis       Assessment & Plan:

## 2013-02-11 LAB — CBC
HGB: 14.2 g/dL
PLATELET COUNT: 272
WBC: 9.7

## 2013-04-27 ENCOUNTER — Ambulatory Visit (INDEPENDENT_AMBULATORY_CARE_PROVIDER_SITE_OTHER): Payer: 59 | Admitting: Family Medicine

## 2013-04-27 ENCOUNTER — Encounter: Payer: Self-pay | Admitting: Family Medicine

## 2013-04-27 VITALS — BP 128/90 | HR 96 | Temp 99.0°F | Ht 62.0 in | Wt 244.8 lb

## 2013-04-27 DIAGNOSIS — H6690 Otitis media, unspecified, unspecified ear: Secondary | ICD-10-CM

## 2013-04-27 DIAGNOSIS — H6693 Otitis media, unspecified, bilateral: Secondary | ICD-10-CM

## 2013-04-27 DIAGNOSIS — R509 Fever, unspecified: Secondary | ICD-10-CM

## 2013-04-27 LAB — POCT INFLUENZA A: Rapid Influenza A Ag: NEGATIVE

## 2013-04-27 MED ORDER — AMOXICILLIN-POT CLAVULANATE 875-125 MG PO TABS: 1.0000 | ORAL_TABLET | Freq: Two times a day (BID) | ORAL | Status: AC

## 2013-04-27 NOTE — Assessment & Plan Note (Addendum)
R>L acute otitis media. Treat with antibiotic. Given muscle aches - check flu swab (negative). Out of work until fever free for 24 hours. Pt agrees with plan.

## 2013-04-27 NOTE — Addendum Note (Signed)
Addended by: Damita Lack on: 04/27/2013 09:50 AM   Modules accepted: Orders

## 2013-04-27 NOTE — Progress Notes (Signed)
  Subjective:    Patient ID: Alisha Wood, female    DOB: 1986/06/21, 27 y.o.   MRN: [id number]  HPI CC: not feeling well  3d h/o sore throat, progressed to nasal and head congestion. Tmax 102 yesterday.  Throat better, but persistent body aches.  Ear pain, nauseated.  Congestion some better.  +nausea.  No vomiting, diarrhea.  No cough.  No rashes.  Has been treating with tylenol cold OTC.    Niece sick with similar sxs. No smokers at home. No asthma hx. No recent travel. 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  No viral illnesses in patients recently. Received flu shot 2 wks ago.  Past Medical History  Diagnosis Date  . Migraines     common and optic, controlled with alleve  . Leg fracture 1995    right  . Obesity      Review of Systems Per HPI    Objective:   Physical Exam  Nursing note and vitals reviewed. Constitutional: She appears well-developed and well-nourished. No distress.  HENT:  Head: Normocephalic and atraumatic.  Right Ear: Ear canal normal. There is swelling and tenderness. Tympanic membrane is injected, erythematous and bulging.  Left Ear: Ear canal normal. There is swelling and tenderness. Tympanic membrane is injected.  Nose: Mucosal edema present. No rhinorrhea. Right sinus exhibits no maxillary sinus tenderness and no frontal sinus tenderness. Left sinus exhibits no maxillary sinus tenderness and no frontal sinus tenderness.  Mouth/Throat: Uvula is midline and mucous membranes are normal. Posterior oropharyngeal erythema present. No oropharyngeal exudate, posterior oropharyngeal edema or tonsillar abscesses.  R TM erythematous, bulging and pus behind. L TM slightly erythematous. Mucosal erythema  Eyes: Conjunctivae and EOM are normal. Pupils are equal, round, and reactive to light. No scleral icterus.  Neck: Normal range of motion. Neck supple.  Cardiovascular: Normal rate, regular rhythm, normal heart sounds and intact distal pulses.   No murmur  heard. Pulmonary/Chest: Effort normal and breath sounds normal. No respiratory distress. She has no wheezes. She has no rales.  Lymphadenopathy:       Head (right side): No submental, no submandibular, no preauricular and no posterior auricular adenopathy present.       Head (left side): No submental, no submandibular, no preauricular and no posterior auricular adenopathy present.    She has no cervical adenopathy.  Skin: Skin is warm and dry. No rash noted.       Assessment & Plan:

## 2013-04-27 NOTE — Patient Instructions (Addendum)
You have bilateral ear infection.  Treat with augmentin twice daily for 7 days.  Take with yogurt. Let us know if not improving as expected. Work note provided today.  Otitis Media, Adult A middle ear infection is an infection in the space behind the eardrum. The medical name for this is "otitis media." It may happen after a common cold. It is caused by a germ that starts growing in that space. You may feel swollen glands in your neck on the side of the ear infection. HOME CARE INSTRUCTIONS   Take your medicine as directed until it is gone, even if you feel better after the first few days.  Only take over-the-counter or prescription medicines for pain, discomfort, or fever as directed by your caregiver.  Occasional use of a nasal decongestant a couple times per day may help with discomfort and help the eustachian tube to drain better. Follow up with your caregiver in 10 to 14 days or as directed, to be certain that the infection has cleared. Not keeping the appointment could result in a chronic or permanent injury, pain, hearing loss and disability. If there is any problem keeping the appointment, you must call back to this facility for assistance. SEEK IMMEDIATE MEDICAL CARE IF:   You are not getting better in 2 to 3 days.  You have pain that is not controlled with medication.  You feel worse instead of better.  You cannot use the medication as directed.  You develop swelling, redness or pain around the ear or stiffness in your neck. MAKE SURE YOU:   Understand these instructions.  Will watch your condition.  Will get help right away if you are not doing well or get worse. Document Released: 04/11/2004 Document Revised: 09/29/2011 Document Reviewed: 02/11/2008 [facility] Patient Information 2014 [place], [place].

## 2013-05-26 ENCOUNTER — Other Ambulatory Visit: Payer: Self-pay

## 2014-03-21 DIAGNOSIS — K760 Fatty (change of) liver, not elsewhere classified: Secondary | ICD-10-CM

## 2014-03-21 HISTORY — DX: Fatty (change of) liver, not elsewhere classified: K76.0

## 2014-03-24 LAB — CBC
ALK PHOS: 87 U/L
ALT: 194 U/L — AB (ref 7–35)
AST: 114 U/L
CREATININE: 0.61
Glucose: 77
Potassium: 4 mmol/L
Sodium: 138 mmol/L (ref 137–147)
Total Bilirubin: 0.4 mg/dL

## 2014-03-24 LAB — HEMOGLOBIN A1C: A1C: 5.2

## 2014-03-24 LAB — TSH: TSH: 1.06

## 2014-03-24 LAB — VITAMIN D 25 HYDROXY (VIT D DEFICIENCY, FRACTURES): VIT D 25 HYDROXY: 22.2

## 2014-03-30 LAB — LIPID PANEL
Cholesterol: 170 mg/dL (ref 0–200)
HDL: 50 mg/dL (ref 35–70)
LDL CALC: 101
Triglycerides: 93

## 2014-04-05 ENCOUNTER — Encounter: Payer: Self-pay | Admitting: Family Medicine

## 2014-04-05 ENCOUNTER — Ambulatory Visit (INDEPENDENT_AMBULATORY_CARE_PROVIDER_SITE_OTHER): Payer: 59 | Admitting: Family Medicine

## 2014-04-05 VITALS — BP 116/72 | HR 96 | Temp 98.6°F | Wt 250.0 lb

## 2014-04-05 DIAGNOSIS — R7402 Elevation of levels of lactic acid dehydrogenase (LDH): Secondary | ICD-10-CM

## 2014-04-05 DIAGNOSIS — E669 Obesity, unspecified: Secondary | ICD-10-CM

## 2014-04-05 DIAGNOSIS — R740 Nonspecific elevation of levels of transaminase and lactic acid dehydrogenase [LDH]: Principal | ICD-10-CM

## 2014-04-05 DIAGNOSIS — R7401 Elevation of levels of liver transaminase levels: Secondary | ICD-10-CM | POA: Insufficient documentation

## 2014-04-05 NOTE — Progress Notes (Signed)
Pre visit review using our clinic review tool, if applicable. No additional management support is needed unless otherwise documented below in the visit note. 

## 2014-04-05 NOTE — Patient Instructions (Addendum)
Make sure prenatal vitamin has at least 1000-2000 IU vit D because your levels were a bit low. Blood work today. We will set you up for abdominal ultrasound to evaluate for fatty liver. We will keep a closer eye on your liver function. Try to get me a copy of your immunization records. Good to see you today.  Fatty Liver Fatty liver is the accumulation of fat in liver cells. It is also called hepatosteatosis or steatohepatitis. It is normal for your liver to contain some fat. If fat is more than 5 to 10% of your liver's weight, you have fatty liver.  There are often no symptoms (problems) for years while damage is still occurring. People often learn about their fatty liver when they have medical tests for other reasons. Fat can damage your liver for years or even decades without causing problems. When it becomes severe, it can cause fatigue, weight loss, weakness, and confusion. This makes you more likely to develop more serious liver problems. The liver is the largest organ in the body. It does a lot of work and often gives no warning signs when it is sick until late in a disease. The liver has many important jobs including:  Breaking down foods.  Storing vitamins, iron, and other minerals.  Making proteins.  Making bile for food digestion.  Breaking down many products including medications, alcohol and some poisons. CAUSES  There are a number of different conditions, medications, and poisons that can cause a fatty liver. Eating too many calories causes fat to build up in the liver. Not processing and breaking fats down normally may also cause this. Certain conditions, such as obesity, diabetes, and high triglycerides also cause this. Most fatty liver patients tend to be middle-aged and over weight.  Some causes of fatty liver are:  Alcohol over consumption.  Malnutrition.  Steroid use.  Valproic acid toxicity.  Obesity.  Cushing's syndrome.  Poisons.  Tetracycline in high  dosages.  Pregnancy.  Diabetes.  Hyperlipidemia.  Rapid weight loss. Some people develop fatty liver even having none of these conditions. SYMPTOMS  Fatty liver most often causes no problems. This is called asymptomatic.  It can be diagnosed with blood tests and also by a liver biopsy.  It is one of the most common causes of minor elevations of liver enzymes on routine blood tests.  Specialized Imaging of the liver using ultrasound, CT (computed tomography) scan, or MRI (magnetic resonance imaging) can suggest a fatty liver but a biopsy is needed to confirm it.  A biopsy involves taking a small sample of liver tissue. This is done by using a needle. It is then looked at under a microscope by a specialist. TREATMENT  It is important to treat the cause. Simple fatty liver without a medical reason may not need treatment.  Weight loss, fat restriction, and exercise in overweight patients produces inconsistent results but is worth trying.  Fatty liver due to alcohol toxicity may not improve even with stopping drinking.  Good control of diabetes may reduce fatty liver.  Lower your triglycerides through diet, medication or both.  Eat a balanced, healthy diet.  Increase your physical activity.  Get regular checkups from a liver specialist.  There are no medical or surgical treatments for a fatty liver or NASH, but improving your diet and increasing your exercise may help prevent or reverse some of the damage. PROGNOSIS  Fatty liver may cause no damage or it can lead to an inflammation of the liver. This  is, called steatohepatitis. When it is linked to alcohol abuse, it is called alcoholic steatohepatitis. It often is not linked to alcohol. It is then called nonalcoholic steatohepatitis, or NASH. Over time the liver may become scarred and hardened. This condition is called cirrhosis. Cirrhosis is serious and may lead to liver failure or cancer. NASH is one of the leading causes of  cirrhosis. About 10-20% of Americans have fatty liver and a smaller 2-5% has NASH. Document Released: 08/22/2005 Document Revised: 09/29/2011 Document Reviewed: 11/16/2013 [facility] Patient Information 2015 [place], [place]. This information is not intended to replace advice given to you by your health care provider. Make sure you discuss any questions you have with your health care provider.

## 2014-04-05 NOTE — Assessment & Plan Note (Signed)
Reviewed presumed dx of fatty liver, cause, and treatment. Already has good lipid and sugar control. rec weight loss through healthy diet and lifestyle changes - already has implemented this. recheck LFTs, GGT and viral hep panel. Check abd [place]. Pt thinks is immunized against Hep A and B - will bring me immunization records. Consider vit E, pending workup.

## 2014-04-05 NOTE — Progress Notes (Signed)
BP 116/72  Pulse 96  Temp(Src) 98.6 F (37 C) (Oral)  Wt 250 lb (113.399 kg)  SpO2 98%  LMP 03/14/2014   CC: abnormal labs at GYN Subjective:    Patient ID: Alisha Wood, female    DOB: October 11, 1985, 28 y.o.   MRN: [id number]  HPI: Alisha Wood is a 28 y.o. female presenting on 04/05/2014 for Abnormal Labs   Alisha Wood presents to discuss abnormal labs obtained at GYN office. I did receive faxed records from [facility] Dr. Garwin Brothers - FLP WNL except for LDL 101, vit D low at 22, ASL/ALT elevated at 114/194, Cr 0.61, glu 77, lytes normal, A1c 5.2%, TSH 1.06, CBC WNL, AMH normal. labwork 03/31/2014. Pap smear with endocervical/squamous metaplasia present.  She has since stated prenatal vitamin - trying to get pregnant.  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 She started herbalife during the month of July. This caused increased headaches (she also went off birth control). She had increased analgesic usage to several times a week (aleve alternating with excedrin). No other herbal supplements. No rec drugs. No alcohol use.   Denies abdominal pain, nausea/vomiting, fevers, yellowing of skin or eyes.  Mother with fatty liver. Father has had elevated LFTs with hepatomegaly in past thought due to diabetes.   Pt has started using myfitness pal and has lost 7 lbs. Walking more regularly at park 2-3 d/wk, 30 min. She has stayed off birth control. OBGYN recommends weight loss ideally to 200lbs prior to pregnancy.  gallbladder removed 2012.  Body mass index is 45.71 kg/(m^2).   Relevant past medical, surgical, family and social history reviewed and updated as indicated.  Allergies and medications reviewed and updated. No current outpatient prescriptions on file prior to visit.   No current facility-administered medications on file prior to visit.    Review of Systems Per HPI unless specifically indicated above    Objective:    BP 116/72  Pulse 96   Temp(Src) 98.6 F (37 C) (Oral)  Wt 250 lb (113.399 kg)  SpO2 98%  LMP 03/14/2014  Physical Exam  Nursing note and vitals reviewed. Constitutional: She appears well-developed and well-nourished. No distress.  Morbidly obese  Cardiovascular: Normal rate, regular rhythm, normal heart sounds and intact distal pulses.   No murmur heard. Pulmonary/Chest: Effort normal and breath sounds normal. No respiratory distress. She has no wheezes. She has no rales.  Abdominal: Soft. Normal appearance and bowel sounds are normal. She exhibits no distension and no mass. There is no hepatosplenomegaly. There is no tenderness. There is no rigidity, no rebound, no guarding, no CVA tenderness and negative Murphy's sign.  Psychiatric:  Tearful with discussion of planned pregnancy and recent abnl labs       Assessment & Plan:   Problem List Items Addressed This Visit   Transaminitis - Primary     Reviewed presumed dx of fatty liver, cause, and treatment. Already has good lipid and sugar control. rec weight loss through healthy diet and lifestyle changes - already has implemented this. recheck LFTs, GGT and viral hep panel. Check abd [place]. Pt thinks is immunized against Hep A and B - will bring me immunization records. Consider vit E, pending workup.    Relevant Orders      Hepatic Function Panel      Gamma GT      Hepatitis panel, acute      US Abdomen Complete   Obesity, unspecified  Continue to encourage weight loss through healthy diet/lifestyle changes.        Follow up plan: Return in about 6 months (around 10/04/2014), or as needed, for follow up visit.

## 2014-04-05 NOTE — Assessment & Plan Note (Signed)
Continue to encourage weight loss through healthy diet/lifestyle changes 

## 2014-04-06 LAB — HEPATITIS PANEL, ACUTE
HCV Ab: NEGATIVE
HEP A IGM: NONREACTIVE
Hep B C IgM: NONREACTIVE
Hepatitis B Surface Ag: NEGATIVE

## 2014-04-06 LAB — HEPATIC FUNCTION PANEL
ALBUMIN: 4.5 g/dL (ref 3.5–5.2)
ALT: 72 U/L — AB (ref 0–35)
AST: 48 U/L — AB (ref 0–37)
Alkaline Phosphatase: 77 U/L (ref 39–117)
Bilirubin, Direct: 0.1 mg/dL (ref 0.0–0.3)
TOTAL PROTEIN: 7.9 g/dL (ref 6.0–8.3)
Total Bilirubin: 1 mg/dL (ref 0.2–1.2)

## 2014-04-06 LAB — GAMMA GT: GGT: 32 U/L (ref 7–51)

## 2014-04-07 ENCOUNTER — Ambulatory Visit (HOSPITAL_COMMUNITY)
Admission: RE | Admit: 2014-04-07 | Discharge: 2014-04-07 | Disposition: A | Discharge status: Home / Self Care | Payer: 59 | Source: Ambulatory Visit | Attending: Family Medicine | Admitting: Family Medicine

## 2014-04-07 DIAGNOSIS — R7402 Elevation of levels of lactic acid dehydrogenase (LDH): Secondary | ICD-10-CM | POA: Diagnosis not present

## 2014-04-07 DIAGNOSIS — R740 Nonspecific elevation of levels of transaminase and lactic acid dehydrogenase [LDH]: Principal | ICD-10-CM

## 2014-04-07 DIAGNOSIS — R7401 Elevation of levels of liver transaminase levels: Secondary | ICD-10-CM | POA: Diagnosis present

## 2014-04-07 IMAGING — US US ABDOMEN COMPLETE
1 series · 13 of 25 positions shown · non-contrast
Comparison: [DATE]

CLINICAL DATA: Elevated liver enzymes

EXAM:
ULTRASOUND ABDOMEN COMPLETE

[Series 1: us abdomen complete · 0.25mm/px · 13 of 53 slices shown]
[im 1/53]
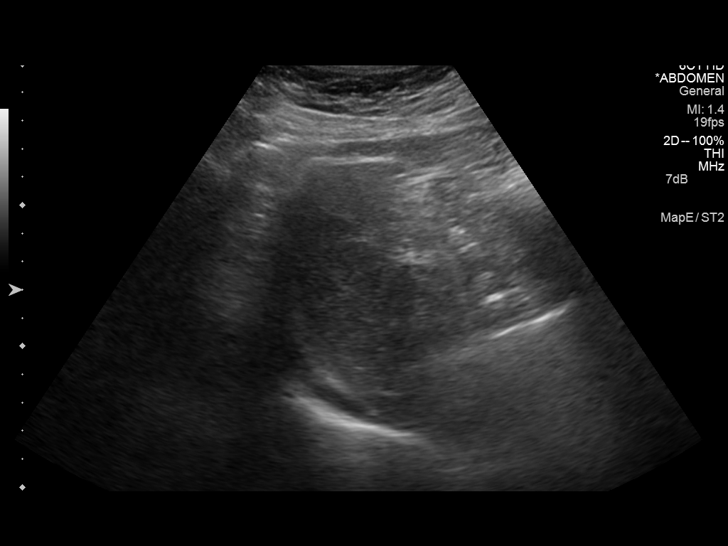
[im 5/53]
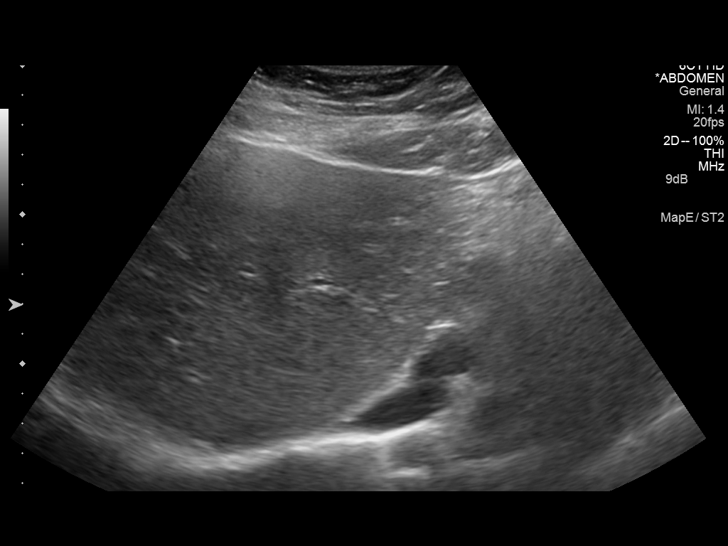
[im 9/53]
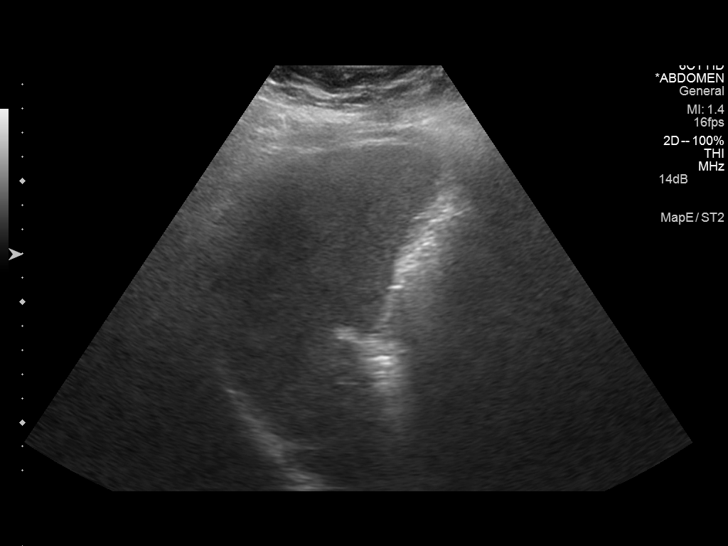
[im 14/53]
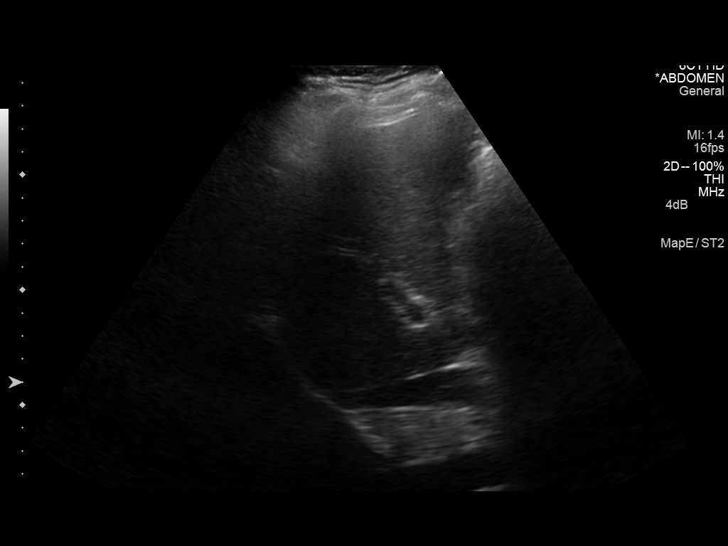
[im 18/53]
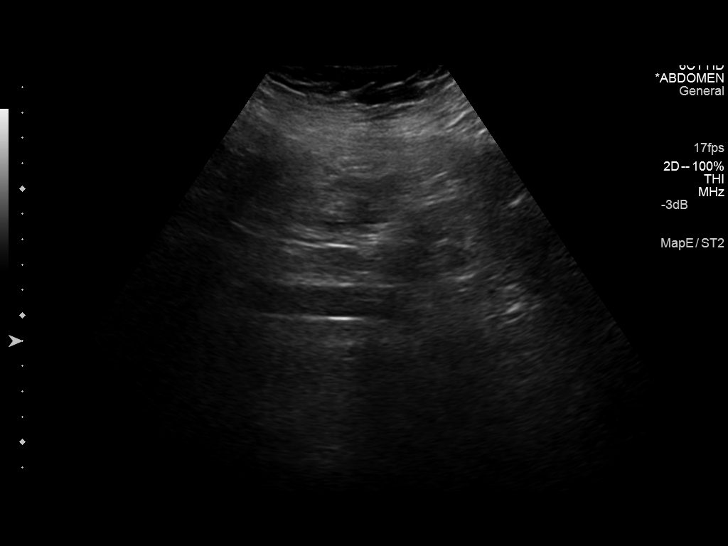
[im 22/53]
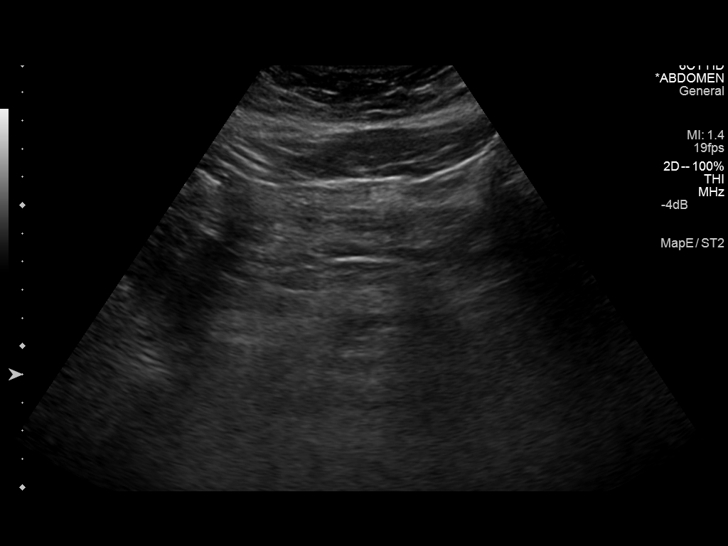
[im 27/53]
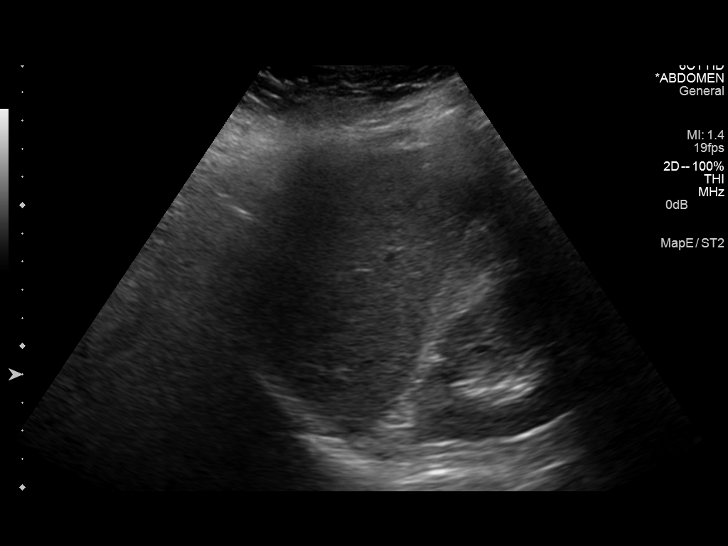
[im 31/53]
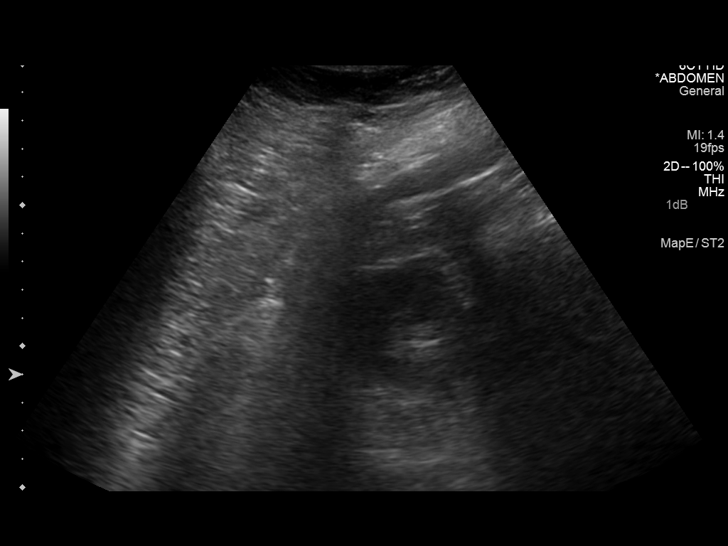
[im 35/53]
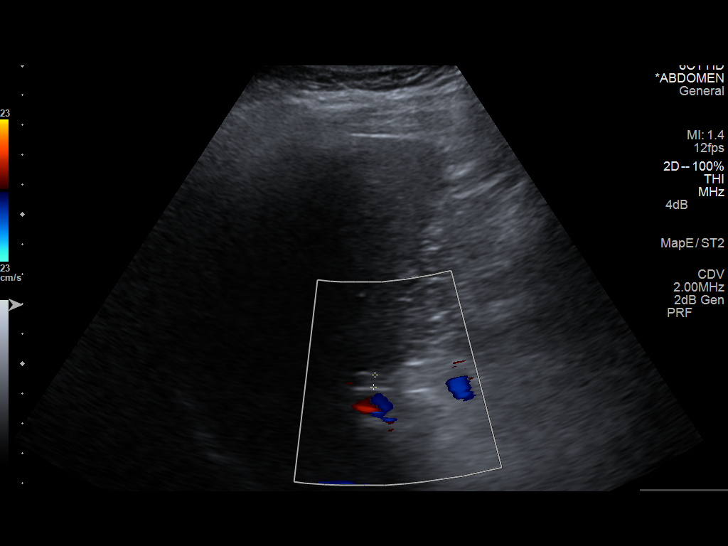
[im 40/53]
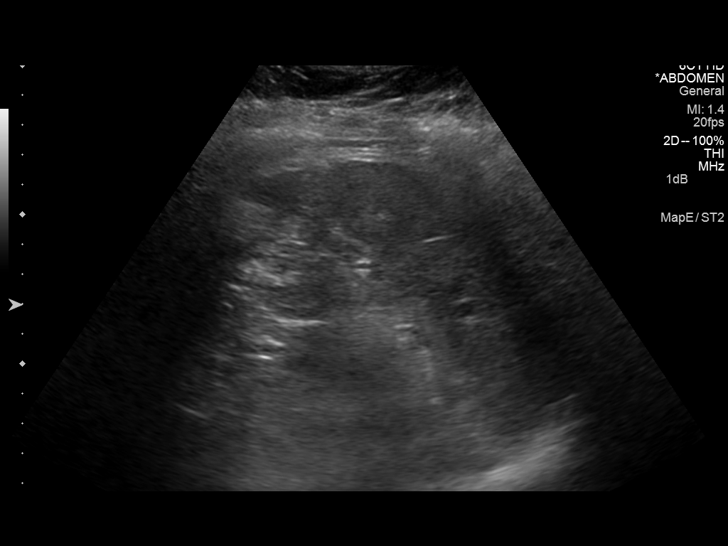
[im 44/53]
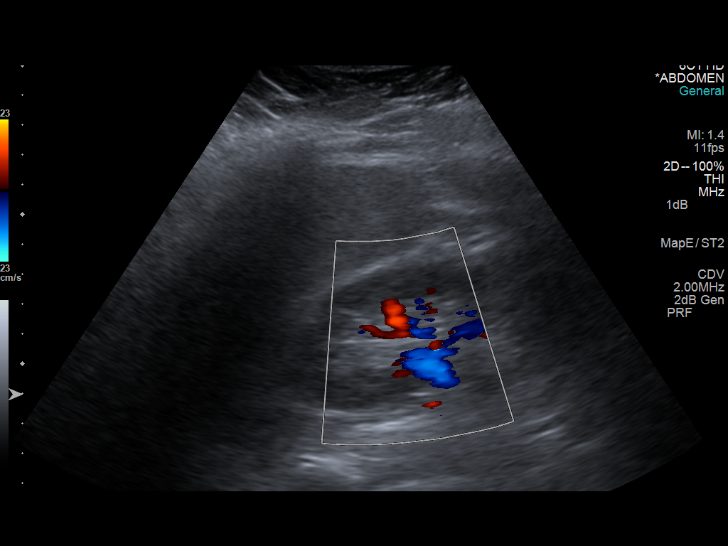
[im 48/53]
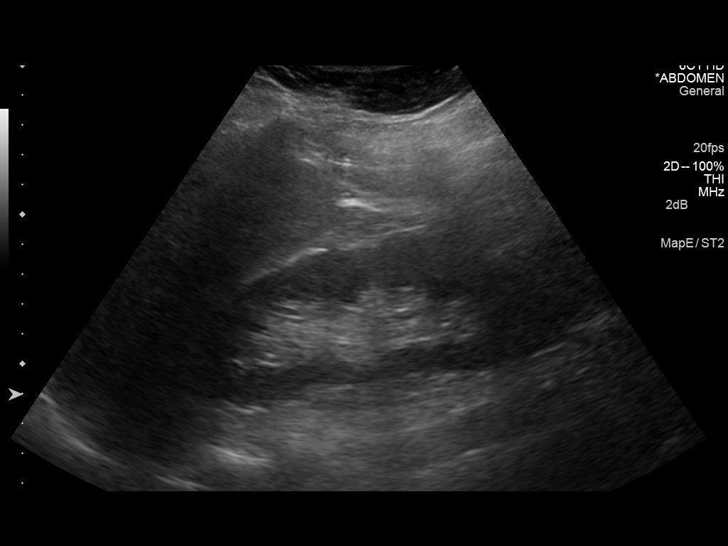
[im 53/53]
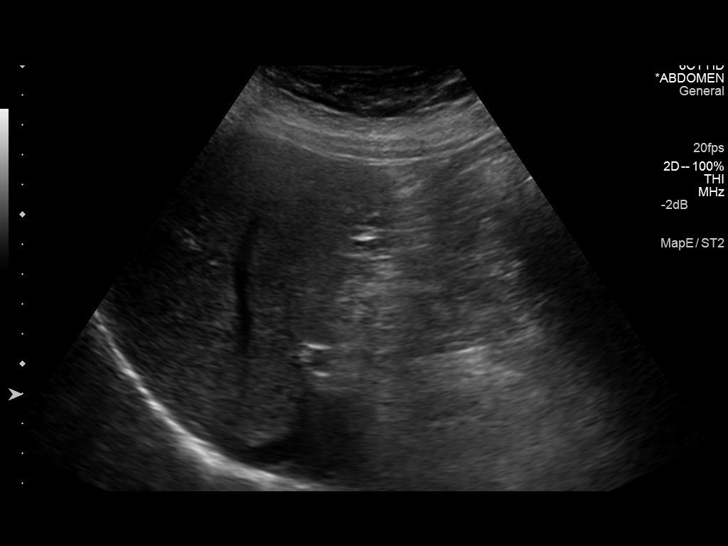

[13 of 25 positions shown; findings below may reference images not displayed]

FINDINGS: Gallbladder:

Surgically absent.

Common bile duct:

Diameter: 4 mm. There is no intrahepatic, common hepatic, or common
bile duct dilatation.

Liver:

No focal lesion identified. Liver echogenicity is mildly increased
overall.

IVC:

No abnormality visualized.

Pancreas:

Visualized portion unremarkable. Portions of the pancreas are
obscured by gas.

Spleen:

Size and appearance within normal limits.

Right Kidney:

Length: 12.5 cm. Echogenicity within normal limits. No mass or
hydronephrosis visualized.

Left Kidney:

Length: 12.6 cm. Echogenicity within normal limits. No mass or
hydronephrosis visualized.

Abdominal aorta:

No aneurysm visualized.

Other findings:

No demonstrable ascites.
IMPRESSION: Liver echogenicity is mildly increased, probably due to a degree of
underlying hepatic steatosis. While no focal liver lesions are
identified, it must be cautioned that the sensitivity of ultrasound
for focal liver lesions is diminished in this circumstance.
Gallbladder absent. Pancreas is partially obscured by gas.
Visualized portions of pancreas appear normal. Study otherwise
unremarkable.

## 2014-04-09 ENCOUNTER — Encounter: Payer: Self-pay | Admitting: Family Medicine

## 2014-04-16 ENCOUNTER — Telehealth: Payer: Self-pay | Admitting: Family Medicine

## 2014-04-16 NOTE — Telephone Encounter (Signed)
I reviewed immunization records. She has received Hep B vaccine. Would suggest Hep A series, and she is also due for Tdap.

## 2014-04-17 NOTE — Telephone Encounter (Signed)
Left detailed msg on VM per HIPAA advising pt as instructed and can call to make a Nurse visit to get immunizations

## 2014-05-11 ENCOUNTER — Encounter: Payer: Self-pay | Admitting: *Deleted

## 2014-08-02 LAB — OB RESULTS CONSOLE ANTIBODY SCREEN: ANTIBODY SCREEN: NEGATIVE

## 2014-08-02 LAB — OB RESULTS CONSOLE ABO/RH: RH TYPE: POSITIVE

## 2014-08-02 LAB — OB RESULTS CONSOLE RPR: RPR: NONREACTIVE

## 2014-08-02 LAB — OB RESULTS CONSOLE GC/CHLAMYDIA
CHLAMYDIA, DNA PROBE: NEGATIVE
Gonorrhea: NEGATIVE

## 2014-08-02 LAB — OB RESULTS CONSOLE HEPATITIS B SURFACE ANTIGEN: Hepatitis B Surface Ag: NEGATIVE

## 2014-08-02 LAB — OB RESULTS CONSOLE HIV ANTIBODY (ROUTINE TESTING): HIV: NONREACTIVE

## 2014-08-02 LAB — OB RESULTS CONSOLE RUBELLA ANTIBODY, IGM: Rubella: IMMUNE

## 2015-01-04 ENCOUNTER — Encounter (HOSPITAL_COMMUNITY): Payer: Self-pay | Admitting: *Deleted

## 2015-01-04 ENCOUNTER — Inpatient Hospital Stay (HOSPITAL_COMMUNITY)
Admission: AD | Admit: 2015-01-04 | Discharge: 2015-01-04 | Disposition: A | Discharge status: Home / Self Care | Payer: 59 | Source: Ambulatory Visit | Attending: Obstetrics and Gynecology | Admitting: Obstetrics and Gynecology

## 2015-01-04 DIAGNOSIS — O163 Unspecified maternal hypertension, third trimester: Secondary | ICD-10-CM | POA: Diagnosis not present

## 2015-01-04 DIAGNOSIS — R030 Elevated blood-pressure reading, without diagnosis of hypertension: Secondary | ICD-10-CM | POA: Diagnosis present

## 2015-01-04 DIAGNOSIS — O1203 Gestational edema, third trimester: Secondary | ICD-10-CM | POA: Insufficient documentation

## 2015-01-04 DIAGNOSIS — Z3A34 34 weeks gestation of pregnancy: Secondary | ICD-10-CM | POA: Diagnosis not present

## 2015-01-04 LAB — COMPREHENSIVE METABOLIC PANEL
ALBUMIN: 2.9 g/dL — AB (ref 3.5–5.0)
ALT: 18 U/L (ref 14–54)
AST: 20 U/L (ref 15–41)
Alkaline Phosphatase: 90 U/L (ref 38–126)
Anion gap: 9 (ref 5–15)
BILIRUBIN TOTAL: 0.4 mg/dL (ref 0.3–1.2)
BUN: 8 mg/dL (ref 6–20)
CHLORIDE: 106 mmol/L (ref 101–111)
CO2: 20 mmol/L — AB (ref 22–32)
CREATININE: 0.38 mg/dL — AB (ref 0.44–1.00)
Calcium: 8.8 mg/dL — ABNORMAL LOW (ref 8.9–10.3)
GFR calc Af Amer: >60 mL/min (ref >60)
Glucose, Bld: 83 mg/dL (ref 65–99)
POTASSIUM: 3.4 mmol/L — AB (ref 3.5–5.1)
SODIUM: 135 mmol/L (ref 135–145)
Total Protein: 6.2 g/dL — ABNORMAL LOW (ref 6.5–8.1)

## 2015-01-04 LAB — CBC
HEMATOCRIT: 33.8 % — AB (ref 36.0–46.0)
Hemoglobin: 11.8 g/dL — ABNORMAL LOW (ref 12.0–15.0)
MCH: 32.6 pg (ref 26.0–34.0)
MCHC: 34.9 g/dL (ref 30.0–36.0)
MCV: 93.4 fL (ref 78.0–100.0)
PLATELETS: 236 10*3/uL (ref 150–400)
RBC: 3.62 MIL/uL — ABNORMAL LOW (ref 3.87–5.11)
RDW: 13.7 % (ref 11.5–15.5)
WBC: 11.8 10*3/uL — AB (ref 4.0–10.5)

## 2015-01-04 LAB — PROTEIN / CREATININE RATIO, URINE
CREATININE, URINE: 51 mg/dL
PROTEIN CREATININE RATIO: 0.14 mg/mg{creat} (ref 0.00–0.15)
Total Protein, Urine: 7 mg/dL

## 2015-01-04 LAB — URIC ACID: Uric Acid, Serum: 2.9 mg/dL (ref 2.3–6.6)

## 2015-01-04 NOTE — Progress Notes (Signed)
BP cuff changed from large narrow cuff to wide large cuff. BP's are now lower. Explained to T. Mel Almond, CNM. Also reported lab results. Emelda Fear will be in to see patient.
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
## 2015-01-04 NOTE — MAU Note (Signed)
Increased swelling today. HA, dizziness. Was seen at MD office and sent to MAU for further evaluation.

## 2015-01-06 ENCOUNTER — Encounter (HOSPITAL_COMMUNITY): Payer: Self-pay | Admitting: *Deleted

## 2015-01-06 ENCOUNTER — Inpatient Hospital Stay (HOSPITAL_COMMUNITY)
Admission: AD | Admit: 2015-01-06 | Discharge: 2015-01-06 | Disposition: A | Discharge status: Home / Self Care | Payer: 59 | Source: Ambulatory Visit | Attending: Obstetrics and Gynecology | Admitting: Obstetrics and Gynecology

## 2015-01-06 DIAGNOSIS — O133 Gestational [pregnancy-induced] hypertension without significant proteinuria, third trimester: Secondary | ICD-10-CM | POA: Insufficient documentation

## 2015-01-06 DIAGNOSIS — Z3A34 34 weeks gestation of pregnancy: Secondary | ICD-10-CM | POA: Diagnosis not present

## 2015-01-06 DIAGNOSIS — R030 Elevated blood-pressure reading, without diagnosis of hypertension: Secondary | ICD-10-CM | POA: Diagnosis present

## 2015-01-06 LAB — CBC
HCT: 35.3 % — ABNORMAL LOW (ref 36.0–46.0)
Hemoglobin: 12.2 g/dL (ref 12.0–15.0)
MCH: 32.3 pg (ref 26.0–34.0)
MCHC: 34.6 g/dL (ref 30.0–36.0)
MCV: 93.4 fL (ref 78.0–100.0)
Platelets: 238 10*3/uL (ref 150–400)
RBC: 3.78 MIL/uL — ABNORMAL LOW (ref 3.87–5.11)
RDW: 13.8 % (ref 11.5–15.5)
WBC: 9.9 10*3/uL (ref 4.0–10.5)

## 2015-01-06 LAB — URINALYSIS, ROUTINE W REFLEX MICROSCOPIC
Bilirubin Urine: NEGATIVE
Glucose, UA: NEGATIVE mg/dL
Hgb urine dipstick: NEGATIVE
KETONES UR: 15 mg/dL — AB
Leukocytes, UA: NEGATIVE
NITRITE: NEGATIVE
PH: 6 (ref 5.0–8.0)
PROTEIN: NEGATIVE mg/dL
Specific Gravity, Urine: 1.025 (ref 1.005–1.030)
Urobilinogen, UA: 0.2 mg/dL (ref 0.0–1.0)

## 2015-01-06 LAB — LACTATE DEHYDROGENASE: LDH: 121 U/L (ref 98–192)

## 2015-01-06 LAB — COMPREHENSIVE METABOLIC PANEL
ALBUMIN: 2.8 g/dL — AB (ref 3.5–5.0)
ALK PHOS: 86 U/L (ref 38–126)
ALT: 19 U/L (ref 14–54)
ANION GAP: 6 (ref 5–15)
AST: 20 U/L (ref 15–41)
BILIRUBIN TOTAL: 0.6 mg/dL (ref 0.3–1.2)
BUN: 9 mg/dL (ref 6–20)
CO2: 22 mmol/L (ref 22–32)
CREATININE: 0.39 mg/dL — AB (ref 0.44–1.00)
Calcium: 8.6 mg/dL — ABNORMAL LOW (ref 8.9–10.3)
Chloride: 109 mmol/L (ref 101–111)
Glucose, Bld: 97 mg/dL (ref 65–99)
Potassium: 3.7 mmol/L (ref 3.5–5.1)
Sodium: 137 mmol/L (ref 135–145)
Total Protein: 5.9 g/dL — ABNORMAL LOW (ref 6.5–8.1)

## 2015-01-06 LAB — PROTEIN / CREATININE RATIO, URINE
CREATININE, URINE: 103 mg/dL
Protein Creatinine Ratio: 0.12 mg/mg{Cre} (ref 0.00–0.15)
Total Protein, Urine: 12 mg/dL

## 2015-01-06 LAB — URIC ACID: Uric Acid, Serum: 3.7 mg/dL (ref 2.3–6.6)

## 2015-01-06 NOTE — MAU Note (Signed)
C/o swollen feet and legs and ? Hypertension; sent by OB for further eval;

## 2015-01-06 NOTE — MAU Note (Signed)
Urine in lab 

## 2015-01-06 NOTE — Discharge Instructions (Signed)
Pre-eclampsia warning signs Preeclampsia and Eclampsia Preeclampsia is a serious condition that develops only during pregnancy. It is also called toxemia of pregnancy. This condition causes high blood pressure along with other symptoms, such as swelling and headaches. These may develop as the condition gets worse. Preeclampsia may occur 20 weeks or later into your pregnancy.  Diagnosing and treating preeclampsia early is very important. If not treated early, it can cause serious problems for you and your baby. One problem it can lead to is eclampsia, which is a condition that causes muscle jerking or shaking (convulsions) in the mother. Delivering your baby is the best treatment for preeclampsia or eclampsia.  RISK FACTORS The cause of preeclampsia is not known. You may be more likely to develop preeclampsia if you have certain risk factors. These include:   Being pregnant for the first time.  Having preeclampsia in a past pregnancy.  Having a family history of preeclampsia.  Having high blood pressure.  Being pregnant with twins or triplets.  Being 74 or older.  Being African American.  Having kidney disease or diabetes.  Having medical conditions such as lupus or blood diseases.  Being very overweight (obese). SIGNS AND SYMPTOMS  The earliest signs of preeclampsia are:  High blood pressure.  Increased protein in your urine. Your health care provider will check for this at every prenatal visit. Other symptoms that can develop include:   Severe headaches.  Sudden weight gain.  Swelling of your hands, face, legs, and feet.  Feeling sick to your stomach (nauseous) and throwing up (vomiting).  Vision problems (blurred or double vision).  Numbness in your face, arms, legs, and feet.  Dizziness.  Slurred speech.  Sensitivity to bright lights.  Abdominal pain. DIAGNOSIS  There are no screening tests for preeclampsia. Your health care provider will ask you about  symptoms and check for signs of preeclampsia during your prenatal visits. You may also have tests, including:  Urine testing.  Blood testing.  Checking your baby's heart rate.  Checking the health of your baby and your placenta using images created with sound waves (ultrasound). TREATMENT  You can work out the best treatment approach together with your health care provider. It is very important to keep all prenatal appointments. If you have an increased risk of preeclampsia, you may need more frequent prenatal exams.  Your health care provider may prescribe bed rest.  You may have to eat as little salt as possible.  You may need to take medicine to lower your blood pressure if the condition does not respond to more conservative measures.  You may need to stay in the hospital if your condition is severe. There, treatment will focus on controlling your blood pressure and fluid retention. You may also need to take medicine to prevent seizures.  If the condition gets worse, your baby may need to be delivered early to protect you and the baby. You may have your labor started with medicine (be induced), or you may have a cesarean delivery.  Preeclampsia usually goes away after the baby is born. HOME CARE INSTRUCTIONS   Only take over-the-counter or prescription medicines as directed by your health care provider.  Lie on your left side while resting. This keeps pressure off your baby.  Elevate your feet while resting.  Get regular exercise. Ask your health care provider what type of exercise is safe for you.  Avoid caffeine and alcohol.  Do not smoke.  Drink 6-8 glasses of water every day.  Eat  a balanced diet that is low in salt. Do not add salt to your food.  Avoid stressful situations as much as possible.  Get plenty of rest and sleep.  Keep all prenatal appointments and tests as scheduled. SEEK MEDICAL CARE IF:  You are gaining more weight than expected.  You have any  headaches, abdominal pain, or nausea.  You are bruising more than usual.  You feel dizzy or light-headed. SEEK IMMEDIATE MEDICAL CARE IF:   You develop sudden or severe swelling anywhere in your body. This usually happens in the legs.  You gain 5 lb (2.3 kg) or more in a week.  You have a severe headache, dizziness, problems with your vision, or confusion.  You have severe abdominal pain.  You have lasting nausea or vomiting.  You have a seizure.  You have trouble moving any part of your body.  You develop numbness in your body.  You have trouble speaking.  You have any abnormal bleeding.  You develop a stiff neck.  You pass out. MAKE SURE YOU:   Understand these instructions.  Will watch your condition.  Will get help right away if you are not doing well or get worse. Document Released: 07/04/2000 Document Revised: 07/12/2013 Document Reviewed: 04/29/2013 [facility] Patient Information 2015 [employer], [place]. This information is not intended to replace advice given to you by your health care provider. Make sure you discuss any questions you have with your health care provider.
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
## 2015-01-09 LAB — OB RESULTS CONSOLE GBS: STREP GROUP B AG: POSITIVE

## 2015-01-17 ENCOUNTER — Encounter (HOSPITAL_COMMUNITY): Payer: Self-pay | Admitting: *Deleted

## 2015-01-17 ENCOUNTER — Inpatient Hospital Stay (HOSPITAL_COMMUNITY)
Admission: AD | Admit: 2015-01-17 | Discharge: 2015-01-17 | Disposition: A | Discharge status: Home / Self Care | Payer: 59 | Source: Ambulatory Visit | Attending: Obstetrics | Admitting: Obstetrics

## 2015-01-17 DIAGNOSIS — K219 Gastro-esophageal reflux disease without esophagitis: Secondary | ICD-10-CM | POA: Insufficient documentation

## 2015-01-17 DIAGNOSIS — Z3689 Encounter for other specified antenatal screening: Secondary | ICD-10-CM

## 2015-01-17 DIAGNOSIS — O99213 Obesity complicating pregnancy, third trimester: Secondary | ICD-10-CM | POA: Insufficient documentation

## 2015-01-17 DIAGNOSIS — O99613 Diseases of the digestive system complicating pregnancy, third trimester: Secondary | ICD-10-CM | POA: Insufficient documentation

## 2015-01-17 DIAGNOSIS — Z3A35 35 weeks gestation of pregnancy: Secondary | ICD-10-CM | POA: Diagnosis not present

## 2015-01-17 DIAGNOSIS — R1013 Epigastric pain: Secondary | ICD-10-CM | POA: Insufficient documentation

## 2015-01-17 LAB — CBC
HCT: 35.1 % — ABNORMAL LOW (ref 36.0–46.0)
Hemoglobin: 12.2 g/dL (ref 12.0–15.0)
MCH: 32.4 pg (ref 26.0–34.0)
MCHC: 34.8 g/dL (ref 30.0–36.0)
MCV: 93.4 fL (ref 78.0–100.0)
PLATELETS: 233 10*3/uL (ref 150–400)
RBC: 3.76 MIL/uL — AB (ref 3.87–5.11)
RDW: 13.7 % (ref 11.5–15.5)
WBC: 10 10*3/uL (ref 4.0–10.5)

## 2015-01-17 LAB — COMPREHENSIVE METABOLIC PANEL
ALT: 15 U/L (ref 14–54)
AST: 20 U/L (ref 15–41)
Albumin: 2.8 g/dL — ABNORMAL LOW (ref 3.5–5.0)
Alkaline Phosphatase: 98 U/L (ref 38–126)
Anion gap: 7 (ref 5–15)
BUN: 7 mg/dL (ref 6–20)
CALCIUM: 8.8 mg/dL — AB (ref 8.9–10.3)
CHLORIDE: 108 mmol/L (ref 101–111)
CO2: 21 mmol/L — ABNORMAL LOW (ref 22–32)
Creatinine, Ser: 0.47 mg/dL (ref 0.44–1.00)
GFR calc Af Amer: >60 mL/min (ref >60)
GFR calc non Af Amer: >60 mL/min (ref >60)
GLUCOSE: 111 mg/dL — AB (ref 65–99)
Potassium: 3.6 mmol/L (ref 3.5–5.1)
Sodium: 136 mmol/L (ref 135–145)
Total Bilirubin: 0.3 mg/dL (ref 0.3–1.2)
Total Protein: 6.3 g/dL — ABNORMAL LOW (ref 6.5–8.1)

## 2015-01-17 LAB — AMYLASE: AMYLASE: 64 U/L (ref 28–100)

## 2015-01-17 LAB — URIC ACID: Uric Acid, Serum: 3.4 mg/dL (ref 2.3–6.6)

## 2015-01-17 LAB — LIPASE, BLOOD: LIPASE: 14 U/L — AB (ref 22–51)

## 2015-01-17 MED ORDER — GI COCKTAIL ~~LOC~~
30.0000 mL | Freq: Once | ORAL | Status: AC
  Administered 2015-01-17: 30 mL via ORAL
  Filled 2015-01-17: qty 30

## 2015-01-17 NOTE — MAU Note (Signed)
Urine in lab 

## 2015-01-17 NOTE — Discharge Instructions (Signed)
Food Choices for Gastroesophageal Reflux Disease When you have gastroesophageal reflux disease (GERD), the foods you eat and your eating habits are very important. Choosing the right foods can help ease the discomfort of GERD. WHAT GENERAL GUIDELINES DO I NEED TO FOLLOW?  Choose fruits, vegetables, whole grains, low-fat dairy products, and low-fat meat, fish, and poultry.  Limit fats such as oils, salad dressings, butter, nuts, and avocado.  Keep a food diary to identify foods that cause symptoms.  Avoid foods that cause reflux. These may be different for different people.  Eat frequent small meals instead of three large meals each day.  Eat your meals slowly, in a relaxed setting.  Limit fried foods.  Cook foods using methods other than frying.  Avoid drinking alcohol.  Avoid drinking large amounts of liquids with your meals.  Avoid bending over or lying down until 2-3 hours after eating. WHAT FOODS ARE NOT RECOMMENDED? The following are some foods and drinks that may worsen your symptoms: Vegetables Tomatoes. Tomato juice. Tomato and spaghetti sauce. Chili peppers. Onion and garlic. Horseradish. Fruits Oranges, grapefruit, and lemon (fruit and juice). Meats High-fat meats, fish, and poultry. This includes hot dogs, ribs, ham, sausage, salami, and bacon. Dairy Whole milk and chocolate milk. Sour cream. Cream. Butter. Ice cream. Cream cheese.  Beverages Coffee and tea, with or without caffeine. Carbonated beverages or energy drinks. Condiments Hot sauce. Barbecue sauce.  Sweets/Desserts Chocolate and cocoa. Donuts. Peppermint and spearmint. Fats and Oils High-fat foods, including French fries and potato chips. Other Vinegar. Strong spices, such as black pepper, white pepper, red pepper, cayenne, curry powder, cloves, ginger, and chili powder. The items listed above may not be a complete list of foods and beverages to avoid. Contact your dietitian for more  information. Document Released: 07/07/2005 Document Revised: 07/12/2013 Document Reviewed: 05/11/2013 [employer] Patient Information 2015 [employer]. This information is not intended to replace advice given to you by your health care provider. Make sure you discuss any questions you have with your health care provider. Gastroesophageal Reflux Disease, Adult Gastroesophageal reflux disease (GERD) happens when acid from your stomach flows up into the esophagus. When acid comes in contact with the esophagus, the acid causes soreness (inflammation) in the esophagus. Over time, GERD may create small holes (ulcers) in the lining of the esophagus. CAUSES   Increased body weight. This puts pressure on the stomach, making acid rise from the stomach into the esophagus.  Smoking. This increases acid production in the stomach.  Drinking alcohol. This causes decreased pressure in the lower esophageal sphincter (valve or ring of muscle between the esophagus and stomach), allowing acid from the stomach into the esophagus.  Late evening meals and a full stomach. This increases pressure and acid production in the stomach.  A malformed lower esophageal sphincter. Sometimes, no cause is found. SYMPTOMS   Burning pain in the lower part of the mid-chest behind the breastbone and in the mid-stomach area. This may occur twice a week or more often.  Trouble swallowing.  Sore throat.  Dry cough.  Asthma-like symptoms including chest tightness, shortness of breath, or wheezing. DIAGNOSIS  Your caregiver may be able to diagnose GERD based on your symptoms. In some cases, X-rays and other tests may be done to check for complications or to check the condition of your stomach and esophagus. TREATMENT  Your caregiver may recommend over-the-counter or prescription medicines to help decrease acid production. Ask your caregiver before starting or adding any new medicines.  HOME CARE   INSTRUCTIONS   Change the  factors that you can control. Ask your caregiver for guidance concerning weight loss, quitting smoking, and alcohol consumption.  Avoid foods and drinks that make your symptoms worse, such as:  Caffeine or alcoholic drinks.  Chocolate.  Peppermint or mint flavorings.  Garlic and onions.  Spicy foods.  Citrus fruits, such as oranges, lemons, or limes.  Tomato-based foods such as sauce, chili, salsa, and pizza.  Fried and fatty foods.  Avoid lying down for the 3 hours prior to your bedtime or prior to taking a nap.  Eat small, frequent meals instead of large meals.  Wear loose-fitting clothing. Do not wear anything tight around your waist that causes pressure on your stomach.  Raise the head of your bed 6 to 8 inches with wood blocks to help you sleep. Extra pillows will not help.  Only take over-the-counter or prescription medicines for pain, discomfort, or fever as directed by your caregiver.  Do not take aspirin, ibuprofen, or other nonsteroidal anti-inflammatory drugs (NSAIDs). SEEK IMMEDIATE MEDICAL CARE IF:   You have pain in your arms, neck, jaw, teeth, or back.  Your pain increases or changes in intensity or duration.  You develop nausea, vomiting, or sweating (diaphoresis).  You develop shortness of breath, or you faint.  Your vomit is green, yellow, black, or looks like coffee grounds or blood.  Your stool is red, bloody, or black. These symptoms could be signs of other problems, such as heart disease, gastric bleeding, or esophageal bleeding. MAKE SURE YOU:   Understand these instructions.  Will watch your condition.  Will get help right away if you are not doing well or get worse. Document Released: 04/16/2005 Document Revised: 09/29/2011 Document Reviewed: 01/24/2011 [employer] Patient Information 2015 [employer]. This information is not intended to replace advice given to you by your health care provider. Make sure you discuss any questions you have  with your health care provider.  
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
## 2015-01-17 NOTE — MAU Note (Signed)
Patient sent from office at [redacted] weeks gestation for abdominal pain and a failed NST. Fetus active. Denies bleeding or discharge.

## 2015-01-25 ENCOUNTER — Encounter (HOSPITAL_COMMUNITY): Payer: Self-pay | Admitting: *Deleted

## 2015-01-25 ENCOUNTER — Inpatient Hospital Stay (HOSPITAL_COMMUNITY)
Admission: AD | Admit: 2015-01-25 | Discharge: 2015-01-25 | Disposition: A | Discharge status: Home / Self Care | Payer: 59 | Source: Ambulatory Visit | Attending: Obstetrics & Gynecology | Admitting: Obstetrics & Gynecology

## 2015-01-25 DIAGNOSIS — Z3A37 37 weeks gestation of pregnancy: Secondary | ICD-10-CM | POA: Insufficient documentation

## 2015-01-25 DIAGNOSIS — O133 Gestational [pregnancy-induced] hypertension without significant proteinuria, third trimester: Secondary | ICD-10-CM | POA: Insufficient documentation

## 2015-01-25 DIAGNOSIS — R030 Elevated blood-pressure reading, without diagnosis of hypertension: Secondary | ICD-10-CM | POA: Diagnosis present

## 2015-01-25 LAB — COMPREHENSIVE METABOLIC PANEL
ALT: 17 U/L (ref 14–54)
AST: 21 U/L (ref 15–41)
Albumin: 3 g/dL — ABNORMAL LOW (ref 3.5–5.0)
Alkaline Phosphatase: 100 U/L (ref 38–126)
Anion gap: 7 (ref 5–15)
BUN: 11 mg/dL (ref 6–20)
CO2: 19 mmol/L — ABNORMAL LOW (ref 22–32)
Calcium: 8.5 mg/dL — ABNORMAL LOW (ref 8.9–10.3)
Chloride: 104 mmol/L (ref 101–111)
Creatinine, Ser: 0.53 mg/dL (ref 0.44–1.00)
GFR calc Af Amer: >60 mL/min (ref >60)
GFR calc non Af Amer: >60 mL/min (ref >60)
Glucose, Bld: 108 mg/dL — ABNORMAL HIGH (ref 65–99)
Potassium: 3.8 mmol/L (ref 3.5–5.1)
Sodium: 130 mmol/L — ABNORMAL LOW (ref 135–145)
Total Bilirubin: 0.5 mg/dL (ref 0.3–1.2)
Total Protein: 6.5 g/dL (ref 6.5–8.1)

## 2015-01-25 LAB — CBC
HCT: 36.6 % (ref 36.0–46.0)
Hemoglobin: 12.6 g/dL (ref 12.0–15.0)
MCH: 32.1 pg (ref 26.0–34.0)
MCHC: 34.4 g/dL (ref 30.0–36.0)
MCV: 93.1 fL (ref 78.0–100.0)
Platelets: 228 10*3/uL (ref 150–400)
RBC: 3.93 MIL/uL (ref 3.87–5.11)
RDW: 13.8 % (ref 11.5–15.5)
WBC: 10.9 10*3/uL — ABNORMAL HIGH (ref 4.0–10.5)

## 2015-01-25 LAB — URIC ACID: Uric Acid, Serum: 4.2 mg/dL (ref 2.3–6.6)

## 2015-01-25 MED ORDER — LABETALOL HCL 100 MG PO TABS
100.0000 mg | ORAL_TABLET | Freq: Two times a day (BID) | ORAL | Status: DC
  Administered 2015-01-25: 100 mg via ORAL
  Filled 2015-01-25: qty 1

## 2015-01-25 MED ORDER — LABETALOL HCL 100 MG PO TABS: 100.0000 mg | ORAL_TABLET | Freq: Two times a day (BID) | ORAL | Status: DC

## 2015-01-25 NOTE — Plan of Care (Signed)
Pt. Urine sent to lab
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
## 2015-01-25 NOTE — MAU Note (Addendum)
Sent over from office for [place].  BP elevation continues, past 3 wks.  Increased swelling. 5# wt gain in past wk.  Denies HA, visual changes or epigastric pain. Pt is on bedrest, not on meds

## 2015-02-05 ENCOUNTER — Inpatient Hospital Stay (HOSPITAL_COMMUNITY): Payer: 59 | Admitting: Anesthesiology

## 2015-02-05 ENCOUNTER — Inpatient Hospital Stay (HOSPITAL_COMMUNITY)
Admission: AD | Admit: 2015-02-05 | Discharge: 2015-02-08 | DRG: 765 | Disposition: A | Discharge status: Home / Self Care | Payer: 59 | Source: Ambulatory Visit | Attending: Obstetrics and Gynecology | Admitting: Obstetrics and Gynecology

## 2015-02-05 ENCOUNTER — Encounter (HOSPITAL_COMMUNITY): Payer: Self-pay

## 2015-02-05 DIAGNOSIS — Z833 Family history of diabetes mellitus: Secondary | ICD-10-CM | POA: Diagnosis not present

## 2015-02-05 DIAGNOSIS — Z6841 Body Mass Index (BMI) 40.0 and over, adult: Secondary | ICD-10-CM

## 2015-02-05 DIAGNOSIS — Z8249 Family history of ischemic heart disease and other diseases of the circulatory system: Secondary | ICD-10-CM | POA: Diagnosis not present

## 2015-02-05 DIAGNOSIS — Z8759 Personal history of other complications of pregnancy, childbirth and the puerperium: Secondary | ICD-10-CM | POA: Diagnosis present

## 2015-02-05 DIAGNOSIS — Z3A38 38 weeks gestation of pregnancy: Secondary | ICD-10-CM | POA: Diagnosis present

## 2015-02-05 DIAGNOSIS — O99824 Streptococcus B carrier state complicating childbirth: Secondary | ICD-10-CM | POA: Diagnosis present

## 2015-02-05 DIAGNOSIS — O99214 Obesity complicating childbirth: Secondary | ICD-10-CM | POA: Diagnosis present

## 2015-02-05 DIAGNOSIS — O133 Gestational [pregnancy-induced] hypertension without significant proteinuria, third trimester: Secondary | ICD-10-CM | POA: Diagnosis present

## 2015-02-05 DIAGNOSIS — O139 Gestational [pregnancy-induced] hypertension without significant proteinuria, unspecified trimester: Secondary | ICD-10-CM | POA: Diagnosis present

## 2015-02-05 DIAGNOSIS — O10919 Unspecified pre-existing hypertension complicating pregnancy, unspecified trimester: Secondary | ICD-10-CM | POA: Diagnosis present

## 2015-02-05 DIAGNOSIS — D62 Acute posthemorrhagic anemia: Secondary | ICD-10-CM | POA: Diagnosis not present

## 2015-02-05 DIAGNOSIS — O9081 Anemia of the puerperium: Secondary | ICD-10-CM | POA: Diagnosis not present

## 2015-02-05 HISTORY — DX: Gestational (pregnancy-induced) hypertension without significant proteinuria, unspecified trimester: O13.9

## 2015-02-05 LAB — COMPREHENSIVE METABOLIC PANEL
ALT: 18 U/L (ref 14–54)
AST: 23 U/L (ref 15–41)
Albumin: 2.7 g/dL — ABNORMAL LOW (ref 3.5–5.0)
Alkaline Phosphatase: 115 U/L (ref 38–126)
Anion gap: 4 — ABNORMAL LOW (ref 5–15)
BILIRUBIN TOTAL: 0.5 mg/dL (ref 0.3–1.2)
BUN: 9 mg/dL (ref 6–20)
CHLORIDE: 111 mmol/L (ref 101–111)
CO2: 21 mmol/L — ABNORMAL LOW (ref 22–32)
Calcium: 8.6 mg/dL — ABNORMAL LOW (ref 8.9–10.3)
Creatinine, Ser: 0.52 mg/dL (ref 0.44–1.00)
GFR calc non Af Amer: >60 mL/min (ref >60)
Glucose, Bld: 96 mg/dL (ref 65–99)
Potassium: 3.9 mmol/L (ref 3.5–5.1)
SODIUM: 136 mmol/L (ref 135–145)
TOTAL PROTEIN: 6 g/dL — AB (ref 6.5–8.1)

## 2015-02-05 LAB — URIC ACID: Uric Acid, Serum: 5.1 mg/dL (ref 2.3–6.6)

## 2015-02-05 LAB — CBC
HCT: 35.7 % — ABNORMAL LOW (ref 36.0–46.0)
HEMATOCRIT: 36.7 % (ref 36.0–46.0)
HEMOGLOBIN: 12.5 g/dL (ref 12.0–15.0)
Hemoglobin: 12.1 g/dL (ref 12.0–15.0)
MCH: 31.6 pg (ref 26.0–34.0)
MCH: 32.1 pg (ref 26.0–34.0)
MCHC: 33.9 g/dL (ref 30.0–36.0)
MCHC: 34.1 g/dL (ref 30.0–36.0)
MCV: 93.2 fL (ref 78.0–100.0)
MCV: 94.3 fL (ref 78.0–100.0)
PLATELETS: 210 10*3/uL (ref 150–400)
Platelets: 183 10*3/uL (ref 150–400)
RBC: 3.83 MIL/uL — AB (ref 3.87–5.11)
RBC: 3.89 MIL/uL (ref 3.87–5.11)
RDW: 13.8 % (ref 11.5–15.5)
RDW: 13.9 % (ref 11.5–15.5)
WBC: 9.3 10*3/uL (ref 4.0–10.5)
WBC: 12.5 10*3/uL — ABNORMAL HIGH (ref 4.0–10.5)

## 2015-02-05 LAB — LACTATE DEHYDROGENASE: LDH: 125 U/L (ref 98–192)

## 2015-02-05 LAB — TYPE AND SCREEN
ABO/RH(D): A POS
ANTIBODY SCREEN: NEGATIVE

## 2015-02-05 LAB — ABO/RH: ABO/RH(D): A POS

## 2015-02-05 LAB — POCT FERN TEST: POCT FERN TEST: POSITIVE

## 2015-02-05 MED ORDER — DIPHENHYDRAMINE HCL 50 MG/ML IJ SOLN: 12.5000 mg | INTRAMUSCULAR | Status: DC | PRN

## 2015-02-05 MED ORDER — FENTANYL 2.5 MCG/ML BUPIVACAINE 1/10 % EPIDURAL INFUSION (WH - ANES)
12.0000 mL/h | INTRAMUSCULAR | Status: DC | PRN
  Administered 2015-02-05 – 2015-02-06 (×3): 12 mL/h via EPIDURAL
  Filled 2015-02-05 (×2): qty 125

## 2015-02-05 MED ORDER — ACETAMINOPHEN 325 MG PO TABS: 650.0000 mg | ORAL_TABLET | ORAL | Status: DC | PRN

## 2015-02-05 MED ORDER — PENICILLIN G POTASSIUM 5000000 UNITS IJ SOLR
5.0000 10*6.[IU] | Freq: Once | INTRAMUSCULAR | Status: AC
  Administered 2015-02-05: 5 10*6.[IU] via INTRAVENOUS
  Filled 2015-02-05: qty 5

## 2015-02-05 MED ORDER — PHENYLEPHRINE 40 MCG/ML (10ML) SYRINGE FOR IV PUSH (FOR BLOOD PRESSURE SUPPORT)
80.0000 ug | PREFILLED_SYRINGE | INTRAVENOUS | Status: DC | PRN
  Filled 2015-02-05: qty 20

## 2015-02-05 MED ORDER — LIDOCAINE HCL (PF) 1 % IJ SOLN
INTRAMUSCULAR | Status: DC | PRN
  Administered 2015-02-05 (×2): 4 mL via EPIDURAL

## 2015-02-05 MED ORDER — OXYTOCIN 40 UNITS IN LACTATED RINGERS INFUSION - SIMPLE MED: 62.5000 mL/h | INTRAVENOUS | Status: DC

## 2015-02-05 MED ORDER — LACTATED RINGERS IV SOLN: 500.0000 mL | INTRAVENOUS | Status: DC | PRN

## 2015-02-05 MED ORDER — LIDOCAINE HCL (PF) 1 % IJ SOLN: 30.0000 mL | INTRAMUSCULAR | Status: DC | PRN

## 2015-02-05 MED ORDER — PENICILLIN G POTASSIUM 5000000 UNITS IJ SOLR
2.5000 10*6.[IU] | INTRAVENOUS | Status: DC
  Administered 2015-02-05 – 2015-02-06 (×4): 2.5 10*6.[IU] via INTRAVENOUS
  Filled 2015-02-05 (×8): qty 2.5

## 2015-02-05 MED ORDER — FENTANYL 2.5 MCG/ML BUPIVACAINE 1/10 % EPIDURAL INFUSION (WH - ANES): 14.0000 mL/h | INTRAMUSCULAR | Status: DC | PRN

## 2015-02-05 MED ORDER — OXYCODONE-ACETAMINOPHEN 5-325 MG PO TABS: 1.0000 | ORAL_TABLET | ORAL | Status: DC | PRN

## 2015-02-05 MED ORDER — CITRIC ACID-SODIUM CITRATE 334-500 MG/5ML PO SOLN
30.0000 mL | ORAL | Status: DC | PRN
  Administered 2015-02-05 – 2015-02-06 (×2): 30 mL via ORAL
  Filled 2015-02-05 (×2): qty 15

## 2015-02-05 MED ORDER — OXYTOCIN BOLUS FROM INFUSION: 500.0000 mL | INTRAVENOUS | Status: DC

## 2015-02-05 MED ORDER — PHENYLEPHRINE 40 MCG/ML (10ML) SYRINGE FOR IV PUSH (FOR BLOOD PRESSURE SUPPORT): 80.0000 ug | PREFILLED_SYRINGE | INTRAVENOUS | Status: DC | PRN

## 2015-02-05 MED ORDER — LABETALOL HCL 5 MG/ML IV SOLN
INTRAVENOUS | Status: AC
  Administered 2015-02-05: 20 mg via INTRAVENOUS
  Filled 2015-02-05: qty 4

## 2015-02-05 MED ORDER — FLEET ENEMA 7-19 GM/118ML RE ENEM: 1.0000 | ENEMA | RECTAL | Status: DC | PRN

## 2015-02-05 MED ORDER — LABETALOL HCL 100 MG PO TABS
100.0000 mg | ORAL_TABLET | Freq: Two times a day (BID) | ORAL | Status: DC
  Administered 2015-02-05 (×2): 100 mg via ORAL
  Filled 2015-02-05 (×3): qty 1

## 2015-02-05 MED ORDER — LABETALOL HCL 5 MG/ML IV SOLN
20.0000 mg | Freq: Once | INTRAVENOUS | Status: AC
Start: 2015-02-05 — End: 2015-02-05
  Administered 2015-02-05: 20 mg via INTRAVENOUS

## 2015-02-05 MED ORDER — BUTORPHANOL TARTRATE 2 MG/ML IJ SOLN
2.0000 mg | INTRAMUSCULAR | Status: DC | PRN
  Administered 2015-02-05 (×2): 2 mg via INTRAVENOUS
  Filled 2015-02-05 (×2): qty 2

## 2015-02-05 MED ORDER — OXYTOCIN 40 UNITS IN LACTATED RINGERS INFUSION - SIMPLE MED
1.0000 m[IU]/min | INTRAVENOUS | Status: DC
  Administered 2015-02-05: 2 m[IU]/min via INTRAVENOUS
  Administered 2015-02-05: 6 m[IU]/min via INTRAVENOUS
  Filled 2015-02-05: qty 1000

## 2015-02-05 MED ORDER — LACTATED RINGERS IV SOLN
INTRAVENOUS | Status: DC
  Administered 2015-02-05: 06:00:00 via INTRAVENOUS
  Administered 2015-02-05: 125 mL/h via INTRAVENOUS

## 2015-02-05 MED ORDER — TERBUTALINE SULFATE 1 MG/ML IJ SOLN: 0.2500 mg | Freq: Once | INTRAMUSCULAR | Status: AC | PRN

## 2015-02-05 MED ORDER — OXYCODONE-ACETAMINOPHEN 5-325 MG PO TABS: 2.0000 | ORAL_TABLET | ORAL | Status: DC | PRN

## 2015-02-05 MED ORDER — ONDANSETRON HCL 4 MG/2ML IJ SOLN
4.0000 mg | Freq: Four times a day (QID) | INTRAMUSCULAR | Status: DC | PRN
Start: 2015-02-05 — End: 2015-02-06
  Administered 2015-02-05 (×2): 4 mg via INTRAVENOUS
  Filled 2015-02-05 (×2): qty 2

## 2015-02-05 MED ORDER — EPHEDRINE 5 MG/ML INJ: 10.0000 mg | INTRAVENOUS | Status: DC | PRN
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
## 2015-02-05 NOTE — Progress Notes (Signed)
S: painful ctx S/p  Stadol IV x 2  O: pitocin IV PCN BP 147/98 VE cervix 2cm/100% thin with balloon/ presentation above  Tracing: baseline 135 (+) accel Ctx ? freq 2-3 mins  IMP: gest HTN on labetalol SROM light mec  (+) GBS  On IV PCN Term P) cont present mgmt. Await balloon out.

## 2015-02-05 NOTE — Anesthesia Preprocedure Evaluation (Addendum)
Anesthesia Evaluation  Patient identified by MRN, date of birth, ID band Patient awake    Reviewed: Allergy & Precautions, Patient's Chart, lab work & pertinent test results, reviewed documented beta blocker date and time   Airway Mallampati: III  TM Distance: >3 FB Neck ROM: Full    Dental no notable dental hx. (+) Teeth Intact   Pulmonary neg pulmonary ROS,  breath sounds clear to auscultation  Pulmonary exam normal       Cardiovascular hypertension, Pt. on medications and Pt. on home beta blockers Normal cardiovascular examRhythm:Regular Rate:Normal     Neuro/Psych  Headaches, negative psych ROS   GI/Hepatic GERD-  Medicated and Controlled,Non alcoholic fatty liver   Endo/Other  Morbid obesity  Renal/GU negative Renal ROS     Musculoskeletal negative musculoskeletal ROS (+)   Abdominal (+) + obese,   Peds  Hematology   Anesthesia Other Findings   Reproductive/Obstetrics (+) Pregnancy PIH                            Anesthesia Physical Anesthesia Plan  ASA: III and emergent  Anesthesia Plan: Epidural   Post-op Pain Management:    Induction: Intravenous  Airway Management Planned: Natural Airway  Additional Equipment:   Intra-op Plan:   Post-operative Plan:   Informed Consent: I have reviewed the patients History and Physical, chart, labs and discussed the procedure including the risks, benefits and alternatives for the proposed anesthesia with the patient or authorized representative who has indicated his/her understanding and acceptance.     Plan Discussed with: Anesthesiologist, CRNA and Surgeon  Anesthesia Plan Comments: (Patient for C/Section for arrest of descent. Will use epidural for C/section. )       Anesthesia Quick Evaluation

## 2015-02-05 NOTE — Plan of Care (Signed)
Problem: Consults Goal: Birthing Suites Patient Information Press F2 to bring up selections list  Outcome: Completed/Met Date Met:  02/05/15  Pt 37-[redacted] weeks EGA     

## 2015-02-05 NOTE — MAU Note (Signed)
Pt reports ROM at 0253, denies contractions

## 2015-02-05 NOTE — Progress Notes (Signed)
S: breathing with ctx  O:  Pitocin 12 Filed Vitals:   02/05/15 1626 02/05/15 1642 02/05/15 1707 02/05/15 1812  BP: 161/97  158/92 151/94  Pulse: 83  75 66  Temp:  97.2 F (36.2 C)    TempSrc:  Axillary    Resp:      Height:      Weight:      SpO2:      VE: balloon in vagina 4/70/-3 asynclitic but applied IUPC, ISE placed  CBC Latest Ref Rng 02/05/2015 02/05/2015 01/25/2015  WBC 4.0 - 10.5 K/uL 12.5(H) 9.3 10.9(H)  Hemoglobin 12.0 - 15.0 g/dL 12.5 12.1 12.6  Hematocrit 36.0 - 46.0 % 36.7 35.7(L) 36.6  Platelets 150 - 400 K/uL 183 210 228   Tracing: baseline 135-140 (+) accel to 150 Ctx q 2 mins  IMP: Gest HTN on labetalol SROM (+) GBS on IV PCN Morbid obesity Term  P) cont IV pitocin. Epidural. Left exaggerated sims position

## 2015-02-05 NOTE — Anesthesia Procedure Notes (Signed)
Epidural Patient location during procedure: OB Start time: 02/05/2015 7:46 PM  Staffing Anesthesiologist: Josephine Igo Performed by: anesthesiologist   Preanesthetic Checklist Completed: patient identified, site marked, surgical consent, pre-op evaluation, timeout performed, IV checked, risks and benefits discussed and monitors and equipment checked  Epidural Patient position: sitting Prep: site prepped and draped and DuraPrep Patient monitoring: continuous pulse ox and blood pressure Approach: midline Location: L3-L4 Injection technique: LOR air  Needle:  Needle type: Tuohy  Needle gauge: 17 G Needle length: 9 cm and 9 Needle insertion depth: 5 cm cm Catheter type: closed end flexible Catheter size: 19 Gauge Catheter at skin depth: 10 cm Test dose: negative and Other  Assessment Events: blood not aspirated, injection not painful, no injection resistance, negative IV test and no paresthesia  Additional Notes Patient identified. Risks and benefits discussed including failed block, incomplete  Pain control, post dural puncture headache, nerve damage, paralysis, blood pressure Changes, nausea, vomiting, reactions to medications-both toxic and allergic and post Partum back pain. All questions were answered. Patient expressed understanding and wished to proceed. Sterile technique was used throughout procedure. Epidural site was Dressed with sterile barrier dressing. No paresthesias, signs of intravascular injection Or signs of intrathecal spread were encountered.  Patient was more comfortable after the epidural was dosed. Please see RN's note for documentation of vital signs and FHR which are stable.

## 2015-02-06 ENCOUNTER — Encounter (HOSPITAL_COMMUNITY): Payer: Self-pay

## 2015-02-06 ENCOUNTER — Encounter (HOSPITAL_COMMUNITY)
Admission: AD | Disposition: A | Discharge status: Home / Self Care | Payer: Self-pay | Source: Ambulatory Visit | Attending: Obstetrics and Gynecology

## 2015-02-06 LAB — RPR: RPR: NONREACTIVE

## 2015-02-06 SURGERY — Surgical Case
Anesthesia: Epidural | Site: Abdomen

## 2015-02-06 MED ORDER — DEXTROSE 5 % IV SOLN
3.0000 g | INTRAVENOUS | Status: DC | PRN
  Administered 2015-02-06: 3 g via INTRAVENOUS

## 2015-02-06 MED ORDER — ONDANSETRON HCL 4 MG/2ML IJ SOLN
INTRAMUSCULAR | Status: DC | PRN
  Administered 2015-02-06: 4 mg via INTRAVENOUS

## 2015-02-06 MED ORDER — NALBUPHINE HCL 10 MG/ML IJ SOLN: 5.0000 mg | INTRAMUSCULAR | Status: DC | PRN

## 2015-02-06 MED ORDER — FERROUS SULFATE 325 (65 FE) MG PO TABS
325.0000 mg | ORAL_TABLET | Freq: Two times a day (BID) | ORAL | Status: DC
  Administered 2015-02-06 – 2015-02-08 (×4): 325 mg via ORAL
  Filled 2015-02-06 (×4): qty 1

## 2015-02-06 MED ORDER — NALOXONE HCL 0.4 MG/ML IJ SOLN: 0.4000 mg | INTRAMUSCULAR | Status: DC | PRN

## 2015-02-06 MED ORDER — KETOROLAC TROMETHAMINE 30 MG/ML IJ SOLN: 30.0000 mg | Freq: Four times a day (QID) | INTRAMUSCULAR | Status: DC | PRN

## 2015-02-06 MED ORDER — SODIUM CHLORIDE 0.9 % IJ SOLN: 3.0000 mL | INTRAMUSCULAR | Status: DC | PRN

## 2015-02-06 MED ORDER — LANOLIN HYDROUS EX OINT: 1.0000 "application " | TOPICAL_OINTMENT | CUTANEOUS | Status: DC | PRN

## 2015-02-06 MED ORDER — SODIUM BICARBONATE 8.4 % IV SOLN
INTRAVENOUS | Status: DC | PRN
  Administered 2015-02-06: 5 mL via EPIDURAL
  Administered 2015-02-06: 3 mL via EPIDURAL
  Administered 2015-02-06: 5 mL via EPIDURAL

## 2015-02-06 MED ORDER — SCOPOLAMINE 1 MG/3DAYS TD PT72
1.0000 | MEDICATED_PATCH | Freq: Once | TRANSDERMAL | Status: DC
  Filled 2015-02-06: qty 1

## 2015-02-06 MED ORDER — FLEET ENEMA 7-19 GM/118ML RE ENEM: 1.0000 | ENEMA | Freq: Every day | RECTAL | Status: DC | PRN

## 2015-02-06 MED ORDER — MORPHINE SULFATE (PF) 0.5 MG/ML IJ SOLN
INTRAMUSCULAR | Status: DC | PRN
  Administered 2015-02-06: 1 mg via INTRAVENOUS
  Administered 2015-02-06: 4 mg via EPIDURAL

## 2015-02-06 MED ORDER — OXYTOCIN 40 UNITS IN LACTATED RINGERS INFUSION - SIMPLE MED: 62.5000 mL/h | INTRAVENOUS | Status: DC

## 2015-02-06 MED ORDER — LABETALOL HCL 100 MG PO TABS
100.0000 mg | ORAL_TABLET | Freq: Two times a day (BID) | ORAL | Status: DC
  Administered 2015-02-07 (×2): 100 mg via ORAL
  Filled 2015-02-06 (×4): qty 1

## 2015-02-06 MED ORDER — SIMETHICONE 80 MG PO CHEW: 80.0000 mg | CHEWABLE_TABLET | ORAL | Status: DC | PRN

## 2015-02-06 MED ORDER — OXYCODONE-ACETAMINOPHEN 5-325 MG PO TABS: 2.0000 | ORAL_TABLET | ORAL | Status: DC | PRN

## 2015-02-06 MED ORDER — ZOLPIDEM TARTRATE 5 MG PO TABS: 5.0000 mg | ORAL_TABLET | Freq: Every evening | ORAL | Status: DC | PRN

## 2015-02-06 MED ORDER — METOCLOPRAMIDE HCL 5 MG/ML IJ SOLN
INTRAMUSCULAR | Status: AC
  Filled 2015-02-06: qty 2

## 2015-02-06 MED ORDER — MORPHINE SULFATE 0.5 MG/ML IJ SOLN
INTRAMUSCULAR | Status: AC
  Filled 2015-02-06: qty 10

## 2015-02-06 MED ORDER — IBUPROFEN 600 MG PO TABS
600.0000 mg | ORAL_TABLET | Freq: Four times a day (QID) | ORAL | Status: DC
  Administered 2015-02-06 – 2015-02-08 (×6): 600 mg via ORAL
  Filled 2015-02-06 (×8): qty 1

## 2015-02-06 MED ORDER — SODIUM CHLORIDE 0.9 % IJ SOLN
3.0000 mL | Freq: Two times a day (BID) | INTRAMUSCULAR | Status: DC
  Administered 2015-02-07: 3 mL via INTRAVENOUS

## 2015-02-06 MED ORDER — SCOPOLAMINE 1 MG/3DAYS TD PT72
MEDICATED_PATCH | TRANSDERMAL | Status: DC | PRN
  Administered 2015-02-06: 1 via TRANSDERMAL

## 2015-02-06 MED ORDER — PRENATAL MULTIVITAMIN CH
1.0000 | ORAL_TABLET | Freq: Every day | ORAL | Status: DC
  Administered 2015-02-07 – 2015-02-08 (×2): 1 via ORAL
  Filled 2015-02-06 (×2): qty 1

## 2015-02-06 MED ORDER — MENTHOL 3 MG MT LOZG: 1.0000 | LOZENGE | OROMUCOSAL | Status: DC | PRN

## 2015-02-06 MED ORDER — NALOXONE HCL 1 MG/ML IJ SOLN
1.0000 ug/kg/h | INTRAVENOUS | Status: DC | PRN
  Filled 2015-02-06: qty 2

## 2015-02-06 MED ORDER — DIBUCAINE 1 % RE OINT: 1.0000 "application " | TOPICAL_OINTMENT | RECTAL | Status: DC | PRN

## 2015-02-06 MED ORDER — DIPHENHYDRAMINE HCL 50 MG/ML IJ SOLN: 12.5000 mg | INTRAMUSCULAR | Status: DC | PRN

## 2015-02-06 MED ORDER — LACTATED RINGERS IV SOLN
INTRAVENOUS | Status: DC
  Administered 2015-02-06: 125 mL/h via INTRAVENOUS

## 2015-02-06 MED ORDER — NALBUPHINE HCL 10 MG/ML IJ SOLN: 5.0000 mg | Freq: Once | INTRAMUSCULAR | Status: AC | PRN

## 2015-02-06 MED ORDER — METOCLOPRAMIDE HCL 5 MG/ML IJ SOLN
INTRAMUSCULAR | Status: DC | PRN
  Administered 2015-02-06: 10 mg via INTRAVENOUS

## 2015-02-06 MED ORDER — OXYCODONE-ACETAMINOPHEN 5-325 MG PO TABS: 1.0000 | ORAL_TABLET | ORAL | Status: DC | PRN

## 2015-02-06 MED ORDER — FENTANYL CITRATE (PF) 100 MCG/2ML IJ SOLN: 25.0000 ug | INTRAMUSCULAR | Status: DC | PRN

## 2015-02-06 MED ORDER — OXYTOCIN 10 UNIT/ML IJ SOLN
INTRAMUSCULAR | Status: AC
  Filled 2015-02-06: qty 4

## 2015-02-06 MED ORDER — DIPHENHYDRAMINE HCL 25 MG PO CAPS: 25.0000 mg | ORAL_CAPSULE | ORAL | Status: DC | PRN

## 2015-02-06 MED ORDER — ONDANSETRON HCL 4 MG/2ML IJ SOLN
INTRAMUSCULAR | Status: AC
  Filled 2015-02-06: qty 2

## 2015-02-06 MED ORDER — MEPERIDINE HCL 25 MG/ML IJ SOLN: 6.2500 mg | INTRAMUSCULAR | Status: DC | PRN

## 2015-02-06 MED ORDER — LACTATED RINGERS IV BOLUS (SEPSIS): 500.0000 mL | Freq: Once | INTRAVENOUS | Status: DC

## 2015-02-06 MED ORDER — WITCH HAZEL-GLYCERIN EX PADS: 1.0000 "application " | MEDICATED_PAD | CUTANEOUS | Status: DC | PRN

## 2015-02-06 MED ORDER — ACETAMINOPHEN 325 MG PO TABS: 650.0000 mg | ORAL_TABLET | ORAL | Status: DC | PRN

## 2015-02-06 MED ORDER — BUPIVACAINE HCL (PF) 0.25 % IJ SOLN
INTRAMUSCULAR | Status: DC | PRN
  Administered 2015-02-06: 7 mL

## 2015-02-06 MED ORDER — BUPIVACAINE HCL (PF) 0.25 % IJ SOLN
INTRAMUSCULAR | Status: AC
  Filled 2015-02-06: qty 30

## 2015-02-06 MED ORDER — LACTATED RINGERS IV SOLN
INTRAVENOUS | Status: DC | PRN
  Administered 2015-02-06: 05:00:00 via INTRAVENOUS

## 2015-02-06 MED ORDER — SCOPOLAMINE 1 MG/3DAYS TD PT72
MEDICATED_PATCH | TRANSDERMAL | Status: AC
  Filled 2015-02-06: qty 1

## 2015-02-06 MED ORDER — SIMETHICONE 80 MG PO CHEW
80.0000 mg | CHEWABLE_TABLET | Freq: Three times a day (TID) | ORAL | Status: DC
  Administered 2015-02-06 – 2015-02-08 (×6): 80 mg via ORAL
  Filled 2015-02-06 (×6): qty 1

## 2015-02-06 MED ORDER — KETOROLAC TROMETHAMINE 30 MG/ML IJ SOLN
30.0000 mg | Freq: Four times a day (QID) | INTRAMUSCULAR | Status: DC | PRN
  Administered 2015-02-06: 30 mg via INTRAVENOUS

## 2015-02-06 MED ORDER — SIMETHICONE 80 MG PO CHEW
80.0000 mg | CHEWABLE_TABLET | ORAL | Status: DC
  Administered 2015-02-06 – 2015-02-07 (×2): 80 mg via ORAL
  Filled 2015-02-06 (×2): qty 1

## 2015-02-06 MED ORDER — BISACODYL 10 MG RE SUPP: 10.0000 mg | Freq: Every day | RECTAL | Status: DC | PRN

## 2015-02-06 MED ORDER — IBUPROFEN 600 MG PO TABS
600.0000 mg | ORAL_TABLET | Freq: Four times a day (QID) | ORAL | Status: DC | PRN
  Administered 2015-02-07: 600 mg via ORAL

## 2015-02-06 MED ORDER — ONDANSETRON HCL 4 MG/2ML IJ SOLN: 4.0000 mg | Freq: Three times a day (TID) | INTRAMUSCULAR | Status: DC | PRN

## 2015-02-06 MED ORDER — SENNOSIDES-DOCUSATE SODIUM 8.6-50 MG PO TABS
2.0000 | ORAL_TABLET | ORAL | Status: DC
  Administered 2015-02-06 – 2015-02-07 (×2): 2 via ORAL
  Filled 2015-02-06 (×2): qty 2

## 2015-02-06 MED ORDER — OXYTOCIN 10 UNIT/ML IJ SOLN
40.0000 [IU] | INTRAVENOUS | Status: DC | PRN
  Administered 2015-02-06: 40 [IU] via INTRAVENOUS

## 2015-02-06 MED ORDER — KETOROLAC TROMETHAMINE 30 MG/ML IJ SOLN
INTRAMUSCULAR | Status: AC
  Administered 2015-02-06: 30 mg via INTRAVENOUS
  Filled 2015-02-06: qty 1

## 2015-02-06 MED ORDER — SODIUM CHLORIDE 0.9 % IV SOLN: 250.0000 mL | INTRAVENOUS | Status: DC

## 2015-02-06 MED ORDER — DIPHENHYDRAMINE HCL 25 MG PO CAPS: 25.0000 mg | ORAL_CAPSULE | Freq: Four times a day (QID) | ORAL | Status: DC | PRN

## 2015-02-06 MED ORDER — DEXTROSE 5 % IV SOLN
INTRAVENOUS | Status: AC
  Filled 2015-02-06: qty 3000

## 2015-02-06 SURGICAL SUPPLY — 42 items
BARRIER ADHS 3X4 INTERCEED (GAUZE/BANDAGES/DRESSINGS) ×2 IMPLANT
BENZOIN TINCTURE PRP APPL 2/3 (GAUZE/BANDAGES/DRESSINGS) IMPLANT
CLAMP CORD UMBIL (MISCELLANEOUS) IMPLANT
CLOTH BEACON ORANGE TIMEOUT ST (SAFETY) ×2 IMPLANT
CONTAINER PREFILL 10% NBF 15ML (MISCELLANEOUS) IMPLANT
DRAPE C SECTION CLR SCREEN (DRAPES) ×2 IMPLANT
DRAPE SHEET LG 3/4 BI-LAMINATE (DRAPES) IMPLANT
DRESSING DISP NPWT PICO 4X12 (MISCELLANEOUS) ×2 IMPLANT
DRSG OPSITE POSTOP 4X10 (GAUZE/BANDAGES/DRESSINGS) ×2 IMPLANT
DURAPREP 26ML APPLICATOR (WOUND CARE) ×2 IMPLANT
ELECT REM PT RETURN 9FT ADLT (ELECTROSURGICAL) ×2
ELECTRODE REM PT RTRN 9FT ADLT (ELECTROSURGICAL) ×1 IMPLANT
EXTRACTOR VACUUM M CUP 4 TUBE (SUCTIONS) IMPLANT
GLOVE BIO SURGEON STRL SZ7 (GLOVE) ×4 IMPLANT
GLOVE BIOGEL M STRL SZ7.5 (GLOVE) ×4 IMPLANT
GLOVE BIOGEL PI IND STRL 7.0 (GLOVE) ×1 IMPLANT
GLOVE BIOGEL PI INDICATOR 7.0 (GLOVE) ×1
GLOVE ECLIPSE 6.5 STRL STRAW (GLOVE) ×2 IMPLANT
GOWN STRL REUS W/TWL LRG LVL3 (GOWN DISPOSABLE) ×4 IMPLANT
KIT ABG SYR 3ML LUER SLIP (SYRINGE) IMPLANT
NEEDLE HYPO 22GX1.5 SAFETY (NEEDLE) ×2 IMPLANT
NEEDLE HYPO 25X5/8 SAFETYGLIDE (NEEDLE) IMPLANT
NS IRRIG 1000ML POUR BTL (IV SOLUTION) ×2 IMPLANT
PACK C SECTION WH (CUSTOM PROCEDURE TRAY) ×2 IMPLANT
PAD OB MATERNITY 4.3X12.25 (PERSONAL CARE ITEMS) ×2 IMPLANT
RTRCTR C-SECT PINK 25CM LRG (MISCELLANEOUS) ×2 IMPLANT
STAPLER VISISTAT 35W (STAPLE) ×2 IMPLANT
STRIP CLOSURE SKIN 1/2X4 (GAUZE/BANDAGES/DRESSINGS) IMPLANT
SUT CHROMIC GUT AB #0 18 (SUTURE) IMPLANT
SUT MNCRL 0 VIOLET CTX 36 (SUTURE) ×3 IMPLANT
SUT MON AB 4-0 PS1 27 (SUTURE) IMPLANT
SUT MONOCRYL 0 CTX 36 (SUTURE) ×3
SUT PLAIN 2 0 (SUTURE)
SUT PLAIN 2 0 XLH (SUTURE) IMPLANT
SUT PLAIN ABS 2-0 CT1 27XMFL (SUTURE) IMPLANT
SUT VIC AB 0 CT1 36 (SUTURE) ×4 IMPLANT
SUT VIC AB 2-0 CT1 27 (SUTURE) ×1
SUT VIC AB 2-0 CT1 TAPERPNT 27 (SUTURE) ×1 IMPLANT
SUT VIC AB 4-0 PS2 27 (SUTURE) IMPLANT
SYR CONTROL 10ML LL (SYRINGE) ×2 IMPLANT
TOWEL OR 17X24 6PK STRL BLUE (TOWEL DISPOSABLE) ×2 IMPLANT
TRAY FOLEY CATH SILVER 14FR (SET/KITS/TRAYS/PACK) IMPLANT

## 2015-02-06 NOTE — Progress Notes (Signed)
At 1430 Patients urine output was 100 for 6 hours. Dr. Harvie Bridge notified via pager according to on-call schedule.   At 1500 after no return call. Dr. Harvie Bridge paged a second time. No return call  At [place] office notified and spoke with Caryl Pina who said she would give Dr. Ronita Hipps the message to call the nurse on [facility].  No return call.  Dr. Kennith Maes cell phone called at 1545. No response. Report given to on-coming nurse. Patient has Lactated Ringer's running at 125/hr.

## 2015-02-06 NOTE — Progress Notes (Signed)
[place] notified of pt's blood pressures.  Order given to hold this dose

## 2015-02-06 NOTE — Lactation Note (Signed)
This note was copied from the chart of [place]. Lactation Consultation Note  [name] RN and [facility] attempted latching with teacup hold and baby would latch but would not sustain. Mother sleepy. Mother's nipples are flat and have short shafts after prepumping w/ hand pump. Was able to hand express drops of colostrum. Introduced #20NS.  Sucks and a few swallows observed.  Baby breastfeed for approx 20 min off and on right breast. Then FOB burped baby and had mother apply NS on left breast.  Baby latched with assistance. Encouraged pre pumping prior to latching.  Discussed that tomorrow we will start her pumping w/ DEBP to stimulate her milk supply. Suggest lots of STS and call if further assistance is needed.   Patient Name: Alisha Wood [id number] Date: 02/06/2015 Reason for consult: Follow-up assessment   Maternal Data    Feeding Feeding Type: Breast Fed Length of feed: 5 min  LATCH Score/Interventions Latch: Repeated attempts needed to sustain latch, nipple held in mouth throughout feeding, stimulation needed to elicit sucking reflex. Intervention(s): Skin to skin Intervention(s): Breast massage;Breast compression;Assist with latch;Adjust position  Audible Swallowing: A few with stimulation Intervention(s): Hand expression  Type of Nipple: Flat Intervention(s): Hand pump  Comfort (Breast/Nipple): Soft / non-tender     Hold (Positioning): Assistance needed to correctly position infant at breast and maintain latch.  LATCH Score: 6  Lactation Tools Discussed/Used Tools: Nipple [name];Pump Nipple shield size: 20 Breast pump type: Manual   Consult Status Consult Status: Follow-up Date: 02/07/15 Follow-up type: In-patient    [name] [name] 02/06/2015, 10:06 PM

## 2015-02-06 NOTE — Anesthesia Postprocedure Evaluation (Signed)
  Anesthesia Post-op Note  Patient: Alisha Wood  Procedure(s) Performed: Procedure(s) (LRB): CESAREAN SECTION (N/A)  Patient Location: PACU  Anesthesia Type: Epidural  Level of Consciousness: awake and alert   Airway and Oxygen Therapy: Patient Spontanous Breathing  Post-op Pain: mild  Post-op Assessment: Post-op Vital signs reviewed, Patient's Cardiovascular Status Stable, Respiratory Function Stable, Patent Airway and No signs of Nausea or vomiting  Last Vitals:  Filed Vitals:   02/06/15 0800  BP:   Pulse: 72  Temp: 36.3 C  Resp: 16    Post-op Vital Signs: stable   Complications: No apparent anesthesia complications

## 2015-02-06 NOTE — Brief Op Note (Signed)
02/05/2015 - 02/06/2015  7:00 AM  PATIENT:  Lenox Ahr  29 y.o. female  PRE-OPERATIVE DIAGNOSIS:  arrest of dilation, morbid obesity, PIH  POST-OPERATIVE DIAGNOSIS:  arrest of dilation, morbid obesity, PIH  PROCEDURE:  Primary Cesarean section, Buddy Duty hysterotomy  SURGEON:  Surgeon(s) and Role:    * Servando Salina, MD - Primary  PHYSICIAN ASSISTANT:   ASSISTANTS: Laury Deep, CNM   ANESTHESIA:   epidural Findings: live female LOT nuchal cord x 1, nl tubes and ovaries  EBL:  Total I/O In: 1600 [I.V.:1600] Out: 2050 [Urine:1350; Blood:700]  BLOOD ADMINISTERED:none  DRAINS: none   LOCAL MEDICATIONS USED:  MARCAINE     SPECIMEN:  Source of Specimen:  placenta  DISPOSITION OF SPECIMEN:  N/A  COUNTS:  YES  TOURNIQUET:  * No tourniquets in log *  DICTATION: .Other Dictation: Dictation Number [id number]  PLAN OF CARE: Admit to inpatient   PATIENT DISPOSITION:  PACU - hemodynamically stable.   Delay start of Pharmacological VTE agent (>24hrs) due to surgical blood loss or risk of bleeding: no

## 2015-02-06 NOTE — Progress Notes (Signed)
Dr. Maudry Diego notified via pager a second time. Lactated Ringers hung at a rate of 125/hr. Rozanna Box, RN

## 2015-02-06 NOTE — Transfer of Care (Signed)
Immediate Anesthesia Transfer of Care Note  Patient: Alisha Wood  Procedure(s) Performed: Procedure(s): CESAREAN SECTION (N/A)  Patient Location: PACU  Anesthesia Type:Epidural  Level of Consciousness: awake, alert  and oriented  Airway & Oxygen Therapy: Patient Spontanous Breathing  Post-op Assessment: Report given to RN and Post -op Vital signs reviewed and stable  Post vital signs: Reviewed and stable  Last Vitals:  Filed Vitals:   02/06/15 0627  BP:   Pulse: 80  Temp: 36.9 C  Resp: 16    Complications: No apparent anesthesia complications

## 2015-02-06 NOTE — Anesthesia Postprocedure Evaluation (Signed)
  Anesthesia Post-op Note  Patient: Alisha Wood  Procedure(s) Performed: Procedure(s): CESAREAN SECTION (N/A)  Patient Location: Mother/Baby  Anesthesia Type:Epidural  Level of Consciousness: awake and oriented  Airway and Oxygen Therapy: Patient Spontanous Breathing  Post-op Pain: mild  Post-op Assessment: Post-op Vital signs reviewed and Patient's Cardiovascular Status Stable LLE Motor Response: Purposeful movement LLE Sensation: Tingling RLE Motor Response: Purposeful movement RLE Sensation: Tingling      Post-op Vital Signs: Reviewed and stable  Last Vitals:  Filed Vitals:   02/06/15 1145  BP: 106/49  Pulse: 94  Temp:   Resp: 18    Complications: No apparent anesthesia complications

## 2015-02-06 NOTE — Lactation Note (Signed)
This note was copied from the chart of [place]. Lactation Consultation Note  Patient Name: Alisha Wood [id number] Date: 02/06/2015 Reason for consult: Initial assessment;Difficult latch Mom very sleepy today and having difficulty with latch. Mom's nipples are flat but compressible. LC was able to get baby to latch using breast compression but baby has difficulty sustaining depth/latch. With assist baby nursed off/on for 35 minutes at this visit and was satisfied.  Several drops of colostrum with hand expression. Demonstrated to Mom how to use hand pump to pre-pump to help with latch. Mom may be candidate for nipple shield but at this time Mom is so sleepy and breast tissue very soft, LC did not feel the nipple shield was appropriate at this time. Baby is tongue thrusting and does appear to have thick, short upper lip (labial) frenulum. Posterior frenulum palpated with finger sweep. Encouraged Mom to call for assist with feedings till she is more awake and can help with latch. Basic teaching reviewed, encouraged to BF with feeding ques. Lactation brochure left for review.   Maternal Data    Feeding Feeding Type: Breast Fed Length of feed: 35 min  LATCH Score/Interventions Latch: Repeated attempts needed to sustain latch, nipple held in mouth throughout feeding, stimulation needed to elicit sucking reflex. Intervention(s): Adjust position;Assist with latch;Breast massage;Breast compression  Audible Swallowing: A few with stimulation  Type of Nipple: Flat Intervention(s): Hand pump  Comfort (Breast/Nipple): Soft / non-tender     Hold (Positioning): Full assist, staff holds infant at breast Intervention(s): Breastfeeding basics reviewed;Support Pillows;Position options;Skin to skin  LATCH Score: 5  Lactation Tools Discussed/Used Tools: Pump Breast pump type: Manual   Consult Status Consult Status: Follow-up Date: 02/07/15 Follow-up type: In-patient    Katrine Coho 02/06/2015, 2:07 PM

## 2015-02-06 NOTE — Progress Notes (Signed)
S: comfortable Epidural Pitocin 24 miu O:  Filed Vitals:   02/06/15 0332 02/06/15 0350 02/06/15 0402 02/06/15 0435  BP: 134/78  132/79 142/101  Pulse: 72  71 78  Temp:  98.7 F (37.1 C)    TempSrc:  Oral    Resp:   18 18  Height:      Weight:      SpO2:      VE per RN unchanged ( 4cm/-3) Tracing cat 1 Ctx q 2 mins >250 MV units  IMP: arrest of dilation despite adequate labor for several hours Gest HTN GBS cx (+) on IV PCN P) recommend primary C/S. Pt agrees. Risk reviewed including infection, bleeding, poss blood transfusion and its risk, injury to surrounding organ structures. All ? Answered. OR notified

## 2015-02-06 NOTE — Anesthesia Postprocedure Evaluation (Signed)
  Anesthesia Post-op Note  Patient: Alisha Wood  Procedure(s) Performed: Procedure(s): CESAREAN SECTION (N/A)  Patient Location: PACU  Anesthesia Type:Epidural  Level of Consciousness: awake, alert  and oriented  Airway and Oxygen Therapy: Patient Spontanous Breathing  Post-op Pain: none  Post-op Assessment: Post-op Vital signs reviewed, Patient's Cardiovascular Status Stable, Respiratory Function Stable, Patent Airway, No signs of Nausea or vomiting, Pain level controlled, No headache, No backache, Spinal receding and Patient able to bend at knees LLE Motor Response: Purposeful movement LLE Sensation: Tingling RLE Motor Response: Purposeful movement RLE Sensation: Tingling      Post-op Vital Signs: Reviewed and stable  Complications: No apparent anesthesia complications

## 2015-02-06 NOTE — Progress Notes (Deleted)
Dr. Garwin Brothers notified via pager [phone] per on call schedule. Patient has a urine output total 100 for 6 hours. Urine is dark amber. Pt completed Pitocin infusion and found to have no IV fluid orders. Rozanna Box, RN

## 2015-02-06 NOTE — Addendum Note (Signed)
Addendum  created 02/06/15 1451 by Vernice Jefferson, CRNA   Modules edited: Notes Section   Notes Section:  File: [id number]

## 2015-02-06 NOTE — Op Note (Signed)
Alisha Wood, Alisha Wood             ACCOUNT NO.:  [id number]  MEDICAL RECORD NO.:  [id number]  LOCATION:  9326                          FACILITY:  [facility]  PHYSICIAN:  Servando Salina, M.D.DATE OF BIRTH:  [birth year]  DATE OF PROCEDURE:  02/06/2015 DATE OF DISCHARGE:                              OPERATIVE REPORT   PREOPERATIVE DIAGNOSES: 1. Arrest of dilatation. 2. Term gestation. 3. Gestational hypertension.  POSTOPERATIVE DIAGNOSES: 1. Arrest of dilatation. 2. Term gestation. 3. Morbid obesity. 4. Gestational hypertension.  PROCEDURE: 1. Primary cesarean section, Kerr hysterotomy.  ANESTHESIA:  Epidural.  SURGEON:  Servando Salina, M.D.  ASSISTANT:  Laury Deep, CNM.  DESCRIPTION OF PROCEDURE:  Under adequate epidural anesthesia, the patient was placed in a supine position with a left lateral tilt.  A Traxi retractor was placed in the upper abdomen to facilitate the large pannus of the patient.  The patient was then sterilely prepped and draped in usual fashion.  An indwelling Foley catheter already was in place on arrival to the operating room. A 0.25% Marcaine was injected along the planned Pfannenstiel skin incision site.  Pfannenstiel skin incision was then made, carried down to the rectus fascia.  The rectus fascia was opened transversely.  The rectus fascia then bluntly and sharply dissected off the rectus muscles in the superior and inferior fashion.  The rectus muscle was split in midline.  The parietal peritoneum was entered bluntly and extended.  A self-retaining Alexis retractor was then placed.  The vesicouterine peritoneum was opened transversely.  The bladder was then bluntly dissected off the lower uterine segment, displaced inferiorly.  A curvilinear low transverse uterine incision was then made and extended with bandage scissors.  The findings were that of a live female in the left occiput transverse position with a nuchal cord, which was reduced.   Baby was bulb suctioned in the abdomen.  The cord was clamped, cut.  The baby was transferred to the awaiting pediatricians, who assigned Apgars of 9 and 9 at 1 and 5 minutes respectively.  The placenta was manually removed. Uterine cavity was cleaned of debris.  In the process of the patient coughing during the surgery, her uterus had spontaneous exteriorized with the placenta still in place.  The uterus was then placed back in the abdomen.  The placenta removed.  The uterine cavity was cleaned of debris.  Uterine incision had no extension, was closed in 2 layers with 0 Monocryl running locked stitch first layer, imbricated with 0 Monocryl stitch and second layer of bleeding of the left angle resulted a figure-of-eight suture being placed, with good hemostasis.  Normal tubes and ovaries had been noted bilaterally.  The abdomen was irrigated and suctioned.  Interceed was placed over the lower uterine segment.  The retractor was then removed. The parietal peritoneum was then closed with 2-0 Vicryl.  The rectus fascia was closed with 0 Vicryl x2.  The subcutaneous area was irrigated, small bleeders cauterized.  The subcutaneous area was closed in 2 layers using 2-0 plain figure-of-eight sutures.  The skin was then approximated with Ethicon staples.  The PICO vacuum sealer was then placed over the incision.  SPECIMENS:  Placenta not sent to Pathology.  ESTIMATED BLOOD LOSS:  700 mL.  INTRAOPERATIVE FLUID:  1600 mL.  URINE OUTPUT:  1350 mL.  COUNTS:  Sponge and instrument counts x2 was correct.  COMPLICATION:  None.  DISPOSITION:  The patient tolerated the procedure well.  The baby was placed on skin to skin and the patient was subsequently transferred to recovery room in stable condition.     Servando Salina, M.D.     [place]/MEDQ  D:  02/06/2015  T:  02/06/2015  Job:  [id number]

## 2015-02-07 ENCOUNTER — Encounter (HOSPITAL_COMMUNITY): Payer: Self-pay | Admitting: Obstetrics and Gynecology

## 2015-02-07 LAB — CBC
HEMATOCRIT: 29.6 % — AB (ref 36.0–46.0)
Hemoglobin: 9.9 g/dL — ABNORMAL LOW (ref 12.0–15.0)
MCH: 31.9 pg (ref 26.0–34.0)
MCHC: 33.4 g/dL (ref 30.0–36.0)
MCV: 95.5 fL (ref 78.0–100.0)
PLATELETS: 197 10*3/uL (ref 150–400)
RBC: 3.1 MIL/uL — AB (ref 3.87–5.11)
RDW: 14.5 % (ref 11.5–15.5)
WBC: 9.7 10*3/uL (ref 4.0–10.5)

## 2015-02-07 LAB — CCBB MATERNAL DONOR DRAW

## 2015-02-07 LAB — BIRTH TISSUE RECOVERY COLLECTION (PLACENTA DONATION)

## 2015-02-07 MED ORDER — PANTOPRAZOLE SODIUM 40 MG PO TBEC
40.0000 mg | DELAYED_RELEASE_TABLET | Freq: Every day | ORAL | Status: DC
  Administered 2015-02-08: 40 mg via ORAL
  Filled 2015-02-07: qty 1

## 2015-02-07 MED ORDER — MAGNESIUM OXIDE 400 (241.3 MG) MG PO TABS
200.0000 mg | ORAL_TABLET | Freq: Every day | ORAL | Status: DC
  Filled 2015-02-07 (×3): qty 0.5

## 2015-02-07 NOTE — Progress Notes (Signed)
Patient ID: Alisha Wood, female   DOB: [birth year], 29 y.o.   MRN: [id number] Subjective: S/P Primary Cesarean Delivery d/t Arrest of Dilatation  POD# 1 Information for the patient's newborn:  Graciella, Arment [[id number]]  female  / circ planning  Reports feeling a little sore Feeding: breast and bottle Patient reports tolerating PO.  Breast symptoms: trouble latching - will work with lactation consultant Pain controlled with ibuprofen (OTC) and narcotic analgesics including Percocet Denies HA/SOB/C/P/N/V/dizziness. Flatus present - belching. No BM. She reports vaginal bleeding as normal, without clots.  She is ambulating, urinating without difficult.     Objective:   VS:  Filed Vitals:   02/06/15 2213 02/06/15 2351 02/07/15 0440 02/07/15 1100  BP: 122/71 113/61 111/59 138/54  Pulse: 79 84 69 73  Temp: 98.5 F (36.9 C) 98.4 F (36.9 C) 98.4 F (36.9 C)   TempSrc: Oral Oral Oral   Resp: 18 18 18    Height:      Weight:      SpO2: 95% 96% 98%      Intake/Output Summary (Last 24 hours) at 02/07/15 1430 Last data filed at 02/07/15 1100  Gross per 24 hour  Intake 4385.83 ml  Output   2550 ml  Net 1835.83 ml        Recent Labs  02/05/15 1827 02/07/15 0650  WBC 12.5* 9.7  HGB 12.5 9.9*  HCT 36.7 29.6*  PLT 183 197     Blood type: A POS (07/18 7829)  Rubella: Immune (01/13 0000)     Physical Exam:  General: alert, cooperative and morbidly obese CV: Regular rate and rhythm, S1S2 present or without murmur or extra heart sounds Resp: clear Abdomen: soft, nontender, normal bowel sounds Incision: Occlusive Vacuum Dressing - C/D/I / skin approximated with staples per OP note Uterine Fundus: firm, 1 FB below umbilicus, nontender Lochia: minimal Ext: extremities normal, atraumatic, or no cyanosis, edema 2+ and Homans sign is negative, no sign of DVT   Assessment/Plan: 29 y.o.   POD# 1.  s/p Cesarean Delivery.  Indications: arrest of dilatation                 Principal Problem:   Postpartum care following cesarean delivery (7/19) Active Problems:  Gestational Hypertension - stable  Doing well, stable.               Regular diet as tolerated Continue Labetalol 100 mg BID, while increased BPs Ambulate Routine post-op care  Graceann Congress, MSN, CNM 02/07/2015, 2:30 PM

## 2015-02-07 NOTE — Lactation Note (Signed)
This note was copied from the chart of [place]. Lactation Consultation Note  Baby is not BF well even with the NS so he was recently syringe fed formula.  Mom's intention is to pump and bottle feed once at home.  After discussing her goals with her she decided that she will pump and bottle feed while she is here.  Mom reports lack of breast changes with pregnancy.  Her breasts have 3 finger breadths between them. She was set up with a double electric breast pump and instructed to pump every 2-3 hours.  SHe was also advised to add hand expression 4-5 times in 24 hours.  Follow-up as needed.  Patient Name: Alisha Wood Today's Date: 02/07/2015     Maternal Data    Feeding Length of feed: 10 min  LATCH Score/Interventions Latch: Repeated attempts needed to sustain latch, nipple held in mouth throughout feeding, stimulation needed to elicit sucking reflex. Intervention(s): Adjust position;Assist with latch (bottom lip curled, not a wide latch)  Audible Swallowing: A few with stimulation Intervention(s): Skin to skin;Hand expression  Type of Nipple: Flat Intervention(s):  (nipple shield)  Comfort (Breast/Nipple): Soft / non-tender     Hold (Positioning): No assistance needed to correctly position infant at breast.  LATCH Score: 7  Lactation Tools Discussed/Used Tools: Nipple Shields Nipple shield size: 20   Consult Status      Van Clines 02/07/2015, 12:47 PM

## 2015-02-08 DIAGNOSIS — Z8759 Personal history of other complications of pregnancy, childbirth and the puerperium: Secondary | ICD-10-CM | POA: Diagnosis present

## 2015-02-08 DIAGNOSIS — O10919 Unspecified pre-existing hypertension complicating pregnancy, unspecified trimester: Secondary | ICD-10-CM | POA: Diagnosis present

## 2015-02-08 DIAGNOSIS — O139 Gestational [pregnancy-induced] hypertension without significant proteinuria, unspecified trimester: Secondary | ICD-10-CM | POA: Diagnosis present

## 2015-02-08 DIAGNOSIS — D62 Acute posthemorrhagic anemia: Secondary | ICD-10-CM | POA: Diagnosis not present

## 2015-02-08 MED ORDER — FERROUS SULFATE 325 (65 FE) MG PO TABS: 325.0000 mg | ORAL_TABLET | Freq: Every day | ORAL | Status: DC

## 2015-02-08 MED ORDER — OXYCODONE-ACETAMINOPHEN 5-325 MG PO TABS: 1.0000 | ORAL_TABLET | ORAL | Status: DC | PRN

## 2015-02-08 MED ORDER — LABETALOL HCL 100 MG PO TABS
100.0000 mg | ORAL_TABLET | Freq: Two times a day (BID) | ORAL | Status: DC
  Filled 2015-02-08 (×3): qty 1

## 2015-02-08 MED ORDER — IBUPROFEN 600 MG PO TABS: 600.0000 mg | ORAL_TABLET | Freq: Four times a day (QID) | ORAL | Status: DC | PRN

## 2015-02-08 MED ORDER — MAGNESIUM OXIDE 400 (241.3 MG) MG PO TABS: 200.0000 mg | ORAL_TABLET | Freq: Every day | ORAL | Status: DC
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
## 2015-02-08 NOTE — Lactation Note (Signed)
This note was copied from the chart of [place]. Lactation Consultation Note  Patient Name: Alisha Wood Date: 02/08/2015 Reason for consult: Follow-up assessment  Baby 6 hours old. Mom states that she has decided to pump and bottle-feed baby. Baby currently in CN for circumcision. Mom states that she has a DEBP. Enc mom to take pumping kit with her for a second set. Discussed pumping schedule and how to pump and return to work. Parents have been giving formula and will continue with formula until mom's milk comes in. Mom states that she is giving baby EBM as she collects it, but is only getting drops right now. Enc mom to offer lots of STS and hand express after pumping. Also enc pumping while FOB feeding baby with bottle. Mom aware of [place] phone line assistance after D/C. Referred mom to Baby and Me booklet for EBM storage guidelines.   Maternal Data    Feeding Feeding Type: Breast Milk with Formula added Nipple Type: Slow - flow  LATCH Score/Interventions                      Lactation Tools Discussed/Used     Consult Status Consult Status: Complete    Alisha Wood, Alisha Wood 02/08/2015, 11:33 AM

## 2015-02-08 NOTE — Discharge Instructions (Signed)
Iron-Rich Diet An iron-rich diet contains foods that are good sources of iron. Iron is an important mineral that helps your body produce hemoglobin. Hemoglobin is a protein in red blood cells that carries oxygen to the body's tissues. Sometimes, the iron level in your blood can be low. This may be caused by:  A lack of iron in your diet.  Blood loss.  Times of growth, such as during pregnancy or during a child's growth and development. Low levels of iron can cause a decrease in the number of red blood cells. This can result in iron deficiency anemia. Iron deficiency anemia symptoms include:  Tiredness.  Weakness.  Irritability.  Increased chance of infection. Here are some recommendations for daily iron intake:  Males older than 30 years of age need 8 mg of iron per day.  Women ages 19 to 50 need 18 mg of iron per day.  Pregnant women need 27 mg of iron per day, and women who are over 19 years of age and breastfeeding need 9 mg of iron per day.  Women over the age of 50 need 8 mg of iron per day. SOURCES OF IRON There are 2 types of iron that are found in food: heme iron and nonheme iron. Heme iron is absorbed by the body better than nonheme iron. Heme iron is found in meat, poultry, and fish. Nonheme iron is found in grains, beans, and vegetables. Heme Iron Sources Food / Iron (mg)  Chicken liver, 3 oz (85 g)/ 10 mg  Beef liver, 3 oz (85 g)/ 5.5 mg  Oysters, 3 oz (85 g)/ 8 mg  Beef, 3 oz (85 g)/ 2 to 3 mg  Shrimp, 3 oz (85 g)/ 2.8 mg  Turkey, 3 oz (85 g)/ 2 mg  Chicken, 3 oz (85 g) / 1 mg  Fish (tuna, halibut), 3 oz (85 g)/ 1 mg  Pork, 3 oz (85 g)/ 0.9 mg Nonheme Iron Sources Food / Iron (mg)  Ready-to-eat breakfast cereal, iron-fortified / 3.9 to 7 mg  Tofu,  cup / 3.4 mg  Kidney beans,  cup / 2.6 mg  Baked potato with skin / 2.7 mg  Asparagus,  cup / 2.2 mg  Avocado / 2 mg  Dried peaches,  cup / 1.6 mg  Raisins,  cup / 1.5 mg  Soy milk, 1 cup  / 1.5 mg  Whole-wheat bread, 1 slice / 1.2 mg  Spinach, 1 cup / 0.8 mg  Broccoli,  cup / 0.6 mg IRON ABSORPTION Certain foods can decrease the body's absorption of iron. Try to avoid these foods and beverages while eating meals with iron-containing foods:  Coffee.  Tea.  Fiber.  Soy. Foods containing vitamin C can help increase the amount of iron your body absorbs from iron sources, especially from nonheme sources. Eat foods with vitamin C along with iron-containing foods to increase your iron absorption. Foods that are high in vitamin C include many fruits and vegetables. Some good sources are:  Fresh orange juice.  Oranges.  Strawberries.  Mangoes.  Grapefruit.  Red bell peppers.  Green bell peppers.  Broccoli.  Potatoes with skin.  Tomato juice. Document Released: 02/18/2005 Document Revised: 09/29/2011 Document Reviewed: 12/26/2010 ExitCare Patient Information 2015 [employer]. This information is not intended to replace advice given to you by your health care provider. Make sure you discuss any questions you have with your health care provider.  

## 2015-02-08 NOTE — Progress Notes (Signed)
POD # 2  Subjective: Pt reports feeling well, desires early discharge/ Pain controlled with Motrin and Percocet Tolerating po/Voiding without problems/ No n/v/ Flatus present, +BM No HA, visual disturbances, or epigastric pain Activity: ad lib Bleeding is light Newborn info:  Information for the patient's newborn:  Jalila, Goodnough [[id number]]  female  / Circumcision: planning/ Feeding: breast and bottle   Objective: VS: VS:  Filed Vitals:   02/07/15 1100 02/07/15 1921 02/07/15 2131 02/08/15 0529  BP: 138/54 125/57 138/59 125/63  Pulse: 73 70 87 75  Temp:  98.4 F (36.9 C) 99.2 F (37.3 C) 98.2 F (36.8 C)  TempSrc:  Oral Oral Oral  Resp:  18 18 18   Height:      Weight:      SpO2:   98%     I&O: Intake/Output      07/20 0701 - 07/21 0700 07/21 0701 - 07/22 0700   P.O.     I.V. (mL/kg)     Total Intake(mL/kg)     Urine (mL/kg/hr) 800 (0.3)    Total Output 800     Net -800            LABS:  Recent Labs  02/05/15 1827 02/07/15 0650  WBC 12.5* 9.7  HGB 12.5 9.9*  PLT 183 197   Blood type: --/--/A POS, A POS (07/18 9628) Rubella: Immune (01/13 0000)           Physical Exam:  General: alert, cooperative and no distress CV: Regular rate and rhythm Resp: CTA bilaterally Abdomen: soft, nontender, normal bowel sounds Incision: Covered with wound vac and Tegaderm; no drainage, edema, bruising, or erythema; skin staples (per Op note) Uterine Fundus: firm, below umbilicus, nontender Lochia: minimal Ext: edema 2+ BLE and Homans sign is negative, no sign of DVT    Assessment: POD # 2/ G1P1001/ S/P C/Section d/t arrest of dilation  Gestational HTN, delivered-stable not on meds ABL anemia Doing well and stable for discharge home  Plan: Discharge home RX's: Ibuprofen 600mg  po Q 6 hrs prn pain #30 Refill x 1 Percocet 5/325 1 - 2 tabs po every 4 hrs prn pain #30 Refill x 0 Niferex 150mg  po QD #30 Refill x 1 Mag Oxide 200 mg po daily #30, refill x1 Follow up  in 5 days for wound vac d/c, staple d/c and BP check at [facility] Follow up in 6 wks for postpartum check at [facility] booklet given    Signed: Julianne Handler, Delane Ginger, MSN, CNM 02/08/2015, 10:32 AM

## 2015-02-10 ENCOUNTER — Inpatient Hospital Stay (HOSPITAL_COMMUNITY): Payer: 59

## 2015-02-10 ENCOUNTER — Observation Stay (HOSPITAL_BASED_OUTPATIENT_CLINIC_OR_DEPARTMENT_OTHER): Payer: 59

## 2015-02-10 ENCOUNTER — Inpatient Hospital Stay (HOSPITAL_COMMUNITY)
Admission: AD | Admit: 2015-02-10 | Discharge: 2015-02-12 | DRG: 189 | Disposition: A | Discharge status: Home / Self Care | Payer: 59 | Source: Ambulatory Visit | Attending: Obstetrics and Gynecology | Admitting: Obstetrics and Gynecology

## 2015-02-10 ENCOUNTER — Encounter (HOSPITAL_COMMUNITY): Payer: Self-pay

## 2015-02-10 DIAGNOSIS — E669 Obesity, unspecified: Secondary | ICD-10-CM | POA: Diagnosis present

## 2015-02-10 DIAGNOSIS — J811 Chronic pulmonary edema: Principal | ICD-10-CM | POA: Diagnosis present

## 2015-02-10 DIAGNOSIS — Z8249 Family history of ischemic heart disease and other diseases of the circulatory system: Secondary | ICD-10-CM

## 2015-02-10 DIAGNOSIS — I509 Heart failure, unspecified: Secondary | ICD-10-CM | POA: Diagnosis not present

## 2015-02-10 DIAGNOSIS — Z6841 Body Mass Index (BMI) 40.0 and over, adult: Secondary | ICD-10-CM

## 2015-02-10 DIAGNOSIS — Z801 Family history of malignant neoplasm of trachea, bronchus and lung: Secondary | ICD-10-CM

## 2015-02-10 DIAGNOSIS — Z833 Family history of diabetes mellitus: Secondary | ICD-10-CM

## 2015-02-10 DIAGNOSIS — Z3A00 Weeks of gestation of pregnancy not specified: Secondary | ICD-10-CM | POA: Diagnosis present

## 2015-02-10 DIAGNOSIS — G43909 Migraine, unspecified, not intractable, without status migrainosus: Secondary | ICD-10-CM | POA: Diagnosis present

## 2015-02-10 DIAGNOSIS — O3421 Maternal care for scar from previous cesarean delivery: Secondary | ICD-10-CM | POA: Diagnosis present

## 2015-02-10 DIAGNOSIS — R0602 Shortness of breath: Secondary | ICD-10-CM

## 2015-02-10 DIAGNOSIS — O139 Gestational [pregnancy-induced] hypertension without significant proteinuria, unspecified trimester: Secondary | ICD-10-CM | POA: Diagnosis present

## 2015-02-10 DIAGNOSIS — J90 Pleural effusion, not elsewhere classified: Secondary | ICD-10-CM | POA: Diagnosis present

## 2015-02-10 DIAGNOSIS — O99214 Obesity complicating childbirth: Secondary | ICD-10-CM | POA: Diagnosis present

## 2015-02-10 LAB — CBC
HCT: 29.5 % — ABNORMAL LOW (ref 36.0–46.0)
Hemoglobin: 9.7 g/dL — ABNORMAL LOW (ref 12.0–15.0)
MCH: 32.2 pg (ref 26.0–34.0)
MCHC: 32.9 g/dL (ref 30.0–36.0)
MCV: 98 fL (ref 78.0–100.0)
Platelets: 237 10*3/uL (ref 150–400)
RBC: 3.01 MIL/uL — ABNORMAL LOW (ref 3.87–5.11)
RDW: 13.8 % (ref 11.5–15.5)
WBC: 10.9 10*3/uL — ABNORMAL HIGH (ref 4.0–10.5)

## 2015-02-10 LAB — COMPREHENSIVE METABOLIC PANEL
ALT: 44 U/L (ref 14–54)
AST: 48 U/L — ABNORMAL HIGH (ref 15–41)
Albumin: 2.7 g/dL — ABNORMAL LOW (ref 3.5–5.0)
Alkaline Phosphatase: 86 U/L (ref 38–126)
Anion gap: 5 (ref 5–15)
BUN: 20 mg/dL (ref 6–20)
CO2: 21 mmol/L — ABNORMAL LOW (ref 22–32)
Calcium: 8.3 mg/dL — ABNORMAL LOW (ref 8.9–10.3)
Chloride: 112 mmol/L — ABNORMAL HIGH (ref 101–111)
Creatinine, Ser: 0.73 mg/dL (ref 0.44–1.00)
GFR calc Af Amer: >60 mL/min (ref >60)
GFR calc non Af Amer: >60 mL/min (ref >60)
Glucose, Bld: 94 mg/dL (ref 65–99)
Potassium: 4 mmol/L (ref 3.5–5.1)
Sodium: 138 mmol/L (ref 135–145)
Total Bilirubin: 0.5 mg/dL (ref 0.3–1.2)
Total Protein: 5.4 g/dL — ABNORMAL LOW (ref 6.5–8.1)

## 2015-02-10 LAB — BRAIN NATRIURETIC PEPTIDE: B Natriuretic Peptide: 431.8 pg/mL — ABNORMAL HIGH (ref 0.0–100.0)

## 2015-02-10 LAB — CLOSTRIDIUM DIFFICILE BY PCR: Toxigenic C. Difficile by PCR: NEGATIVE

## 2015-02-10 LAB — URIC ACID: Uric Acid, Serum: 6 mg/dL (ref 2.3–6.6)

## 2015-02-10 IMAGING — CR DG CHEST 2V
2 series · 2 of 2 positions shown · non-contrast
Comparison: [DATE]

CLINICAL DATA: Shortness of Breath

EXAM:
CHEST - 2 VIEW

[chest pa]
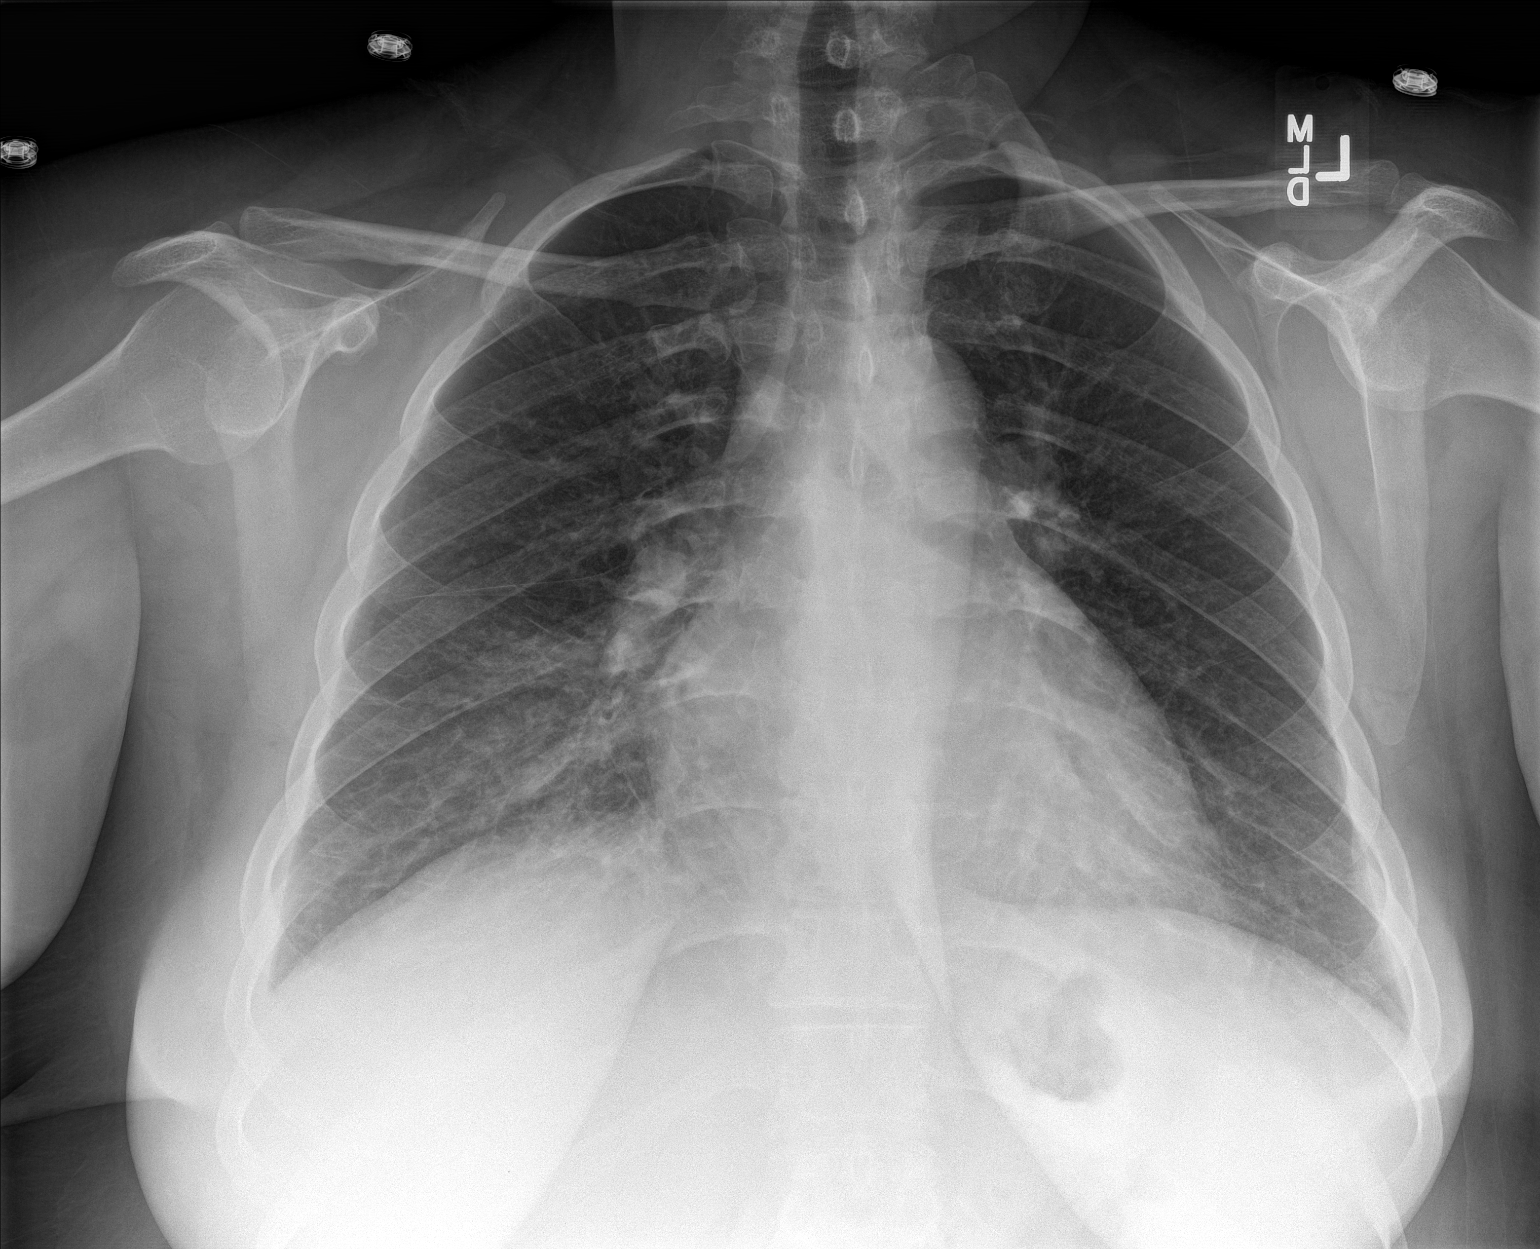

[chest lat]
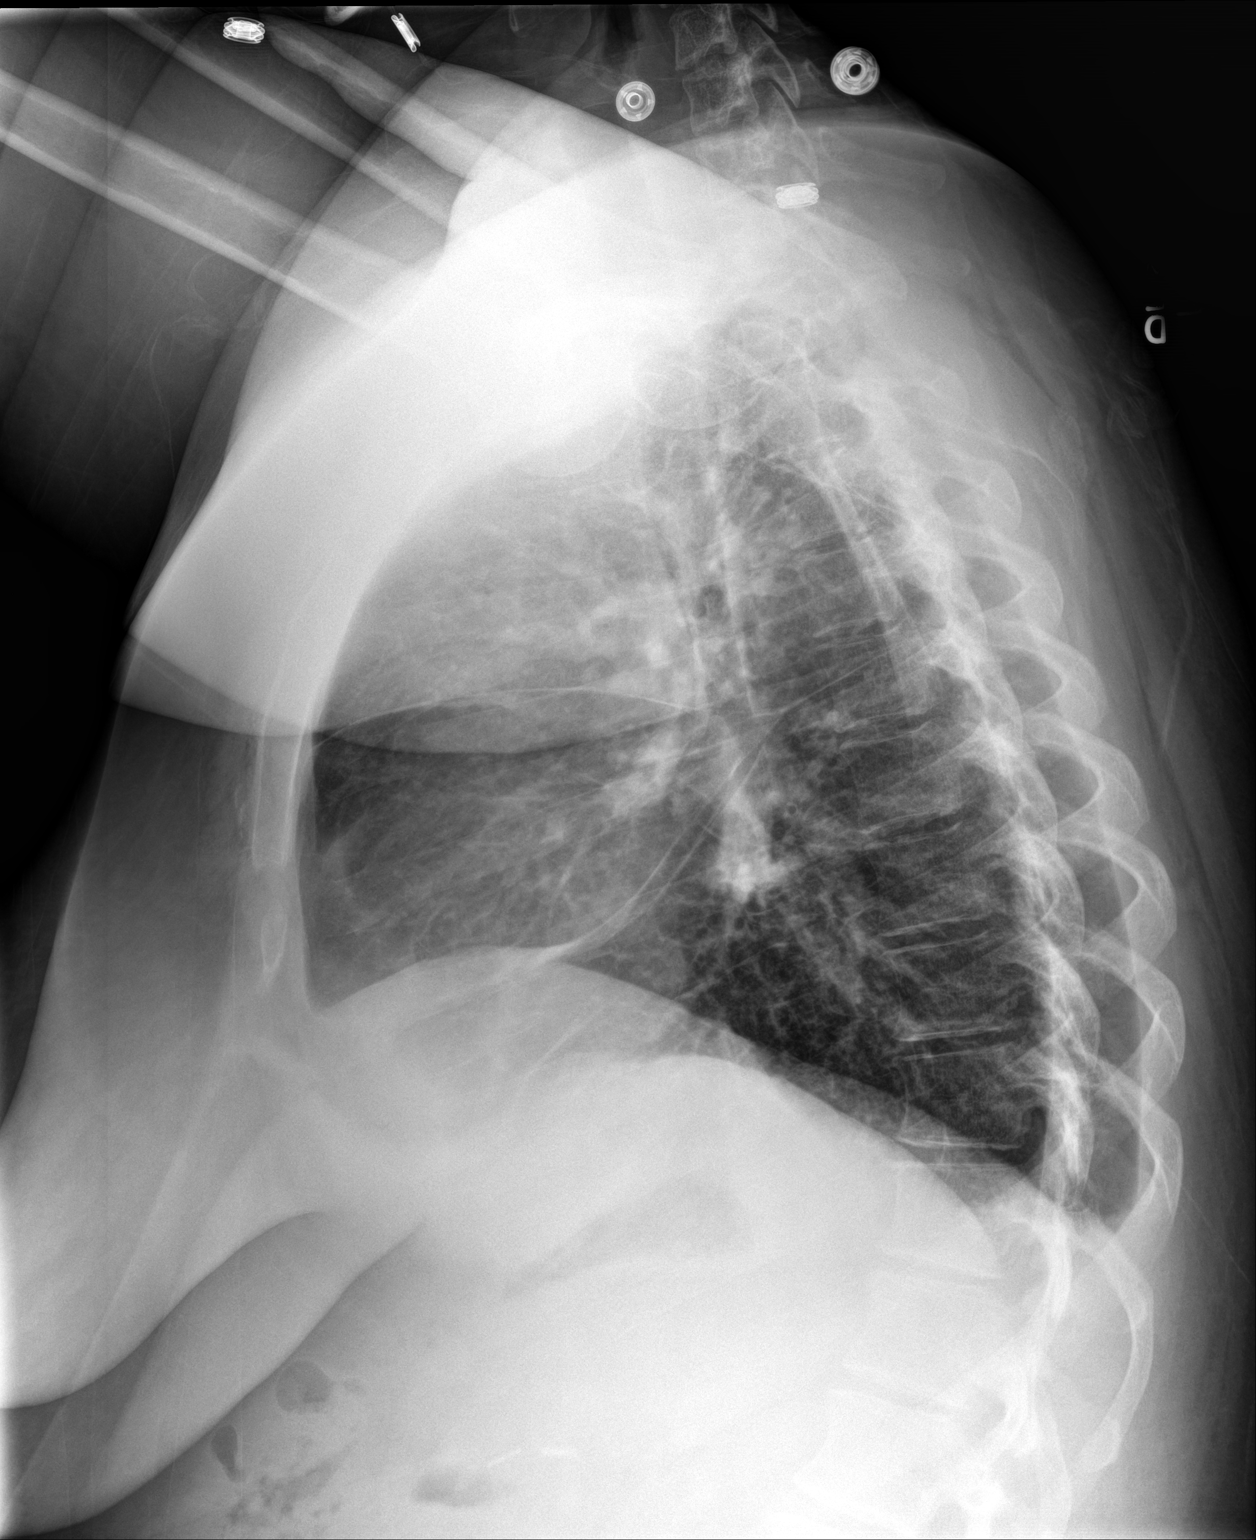

[2 of 2 positions shown; findings below may reference images not displayed]

FINDINGS: Cardiac shadow is within normal limits. The lungs are well aerated
bilaterally with increased density in the right lung base medially
consistent with right middle lobe infiltrate. No other focal
abnormality is seen.
IMPRESSION: Right middle lobe infiltrate.

## 2015-02-10 IMAGING — CT CT ANGIO CHEST
1 series · 19 of 34 positions shown · IV contrast (OMNIPAQUE)
Comparison: Chest x-ray earlier today.

CLINICAL DATA: Shortness of breath. Postpartum and status post
Cesarean section on [DATE].

EXAM:
CT ANGIOGRAPHY CHEST WITH CONTRAST
TECHNIQUE: Multidetector CT imaging of the chest was performed using the
standard protocol during bolus administration of intravenous
contrast. Multiplanar CT image reconstructions and MIPs were
obtained to evaluate the vascular anatomy.
CONTRAST:  100mL OMNIPAQUE IOHEXOL 350 MG/ML SOLN

[Series 6: pe chest · axial · 0.84mm/px · z∈[-376,-136]mm · 19 of 130 slices shown]
[im 5/130  lung]
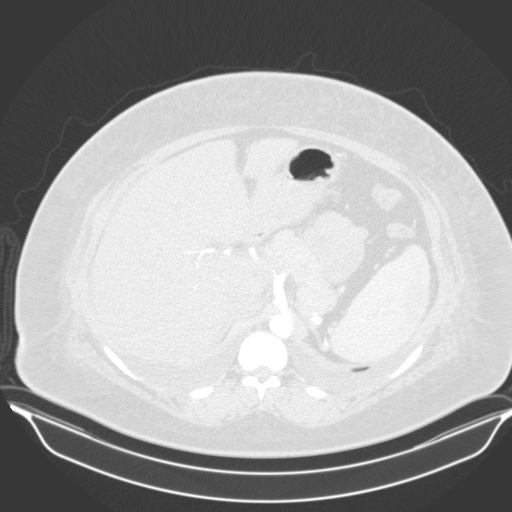
[im 15/130  mediastinal]
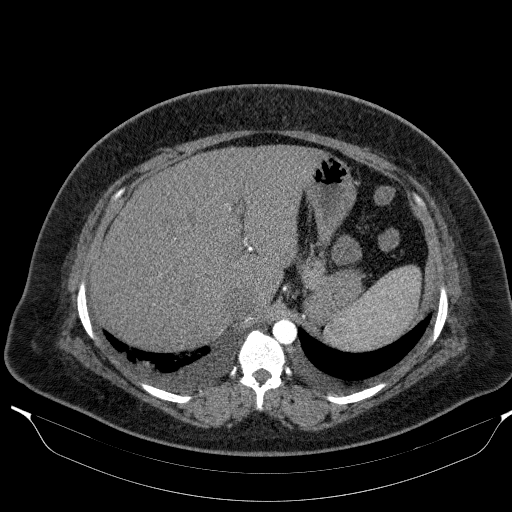
[im 24/130  lung]
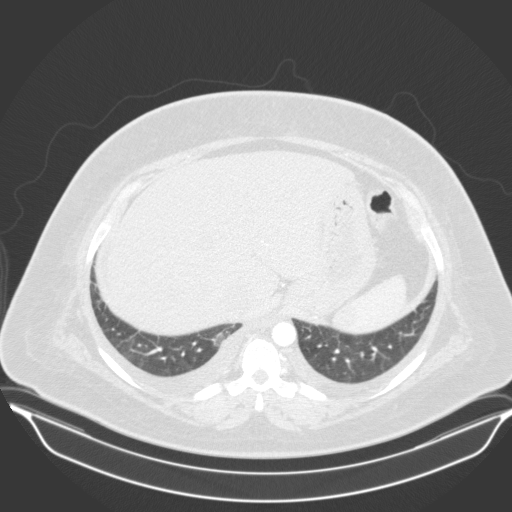
[im 26/130  mediastinal]
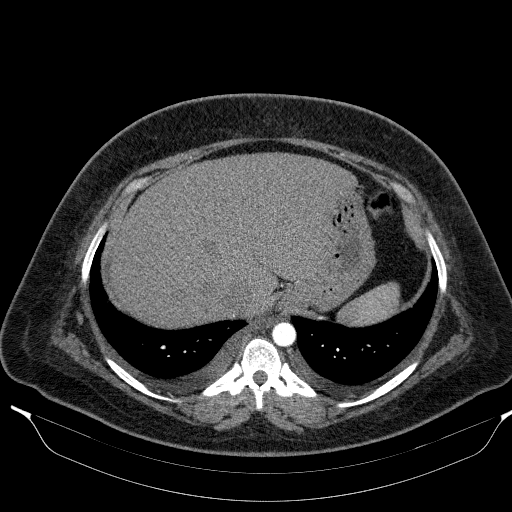
[im 34/130  lung]
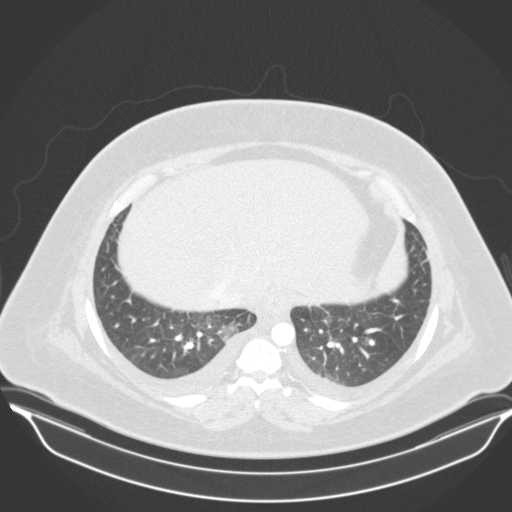
[im 44/130  mediastinal]
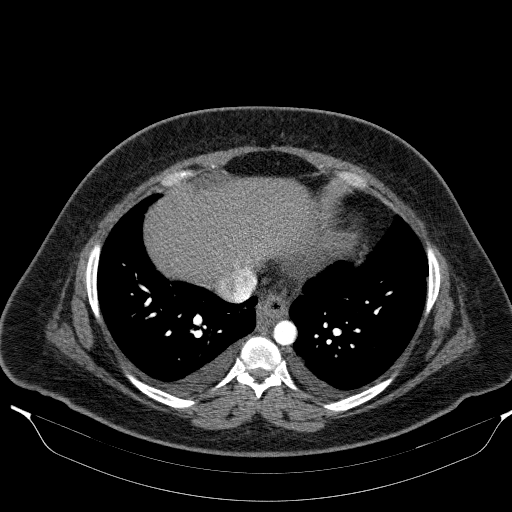
[im 48/130  lung]
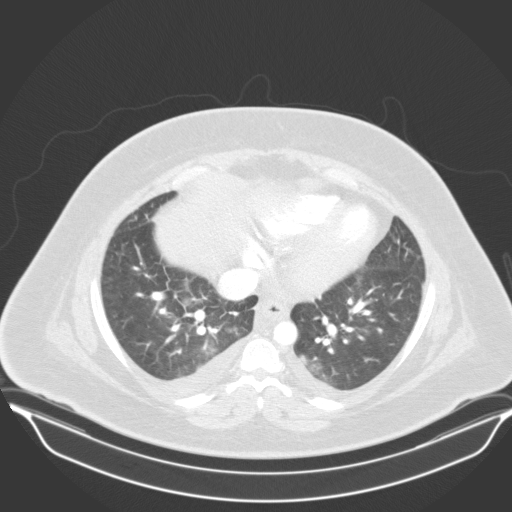
[im 53/130  mediastinal]
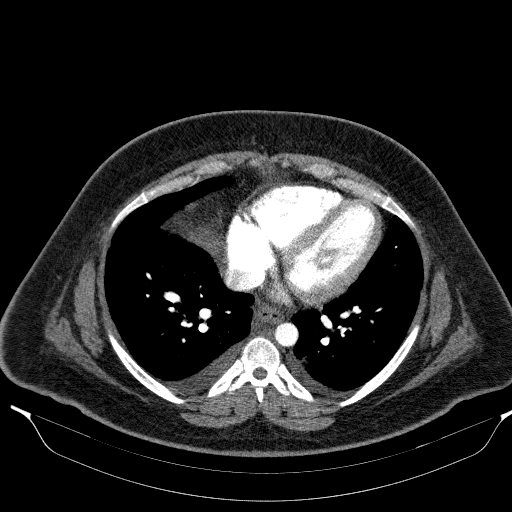
[im 61/130  lung]
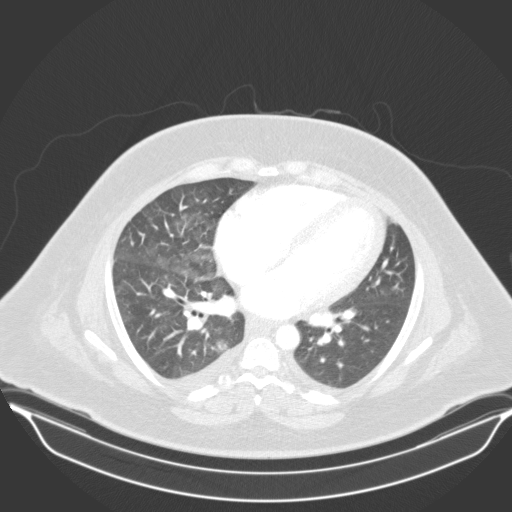
[im 67/130  mediastinal]
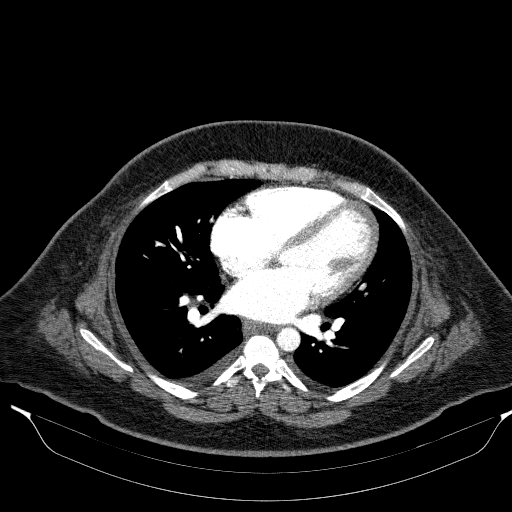
[im 70/130  lung]
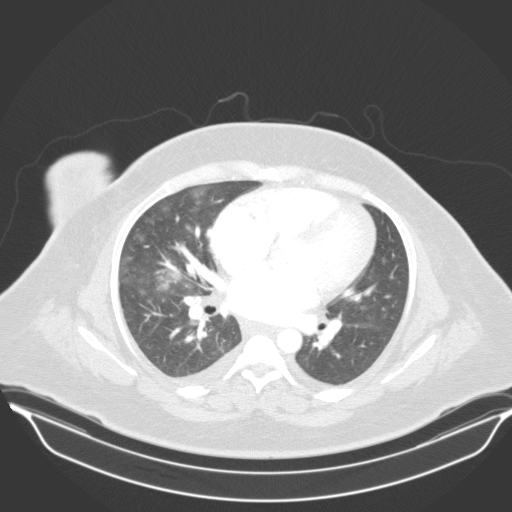
[im 77/130  mediastinal]
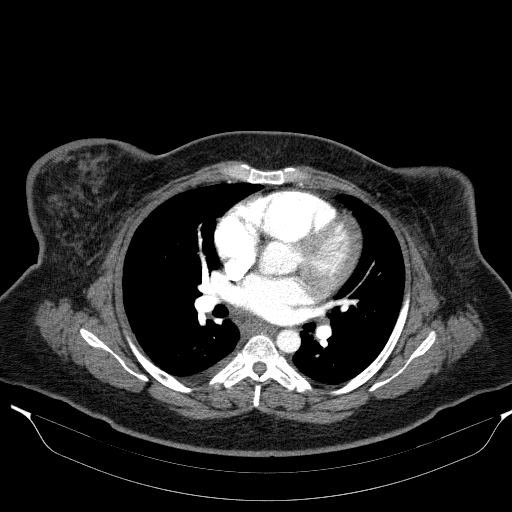
[im 82/130  lung]
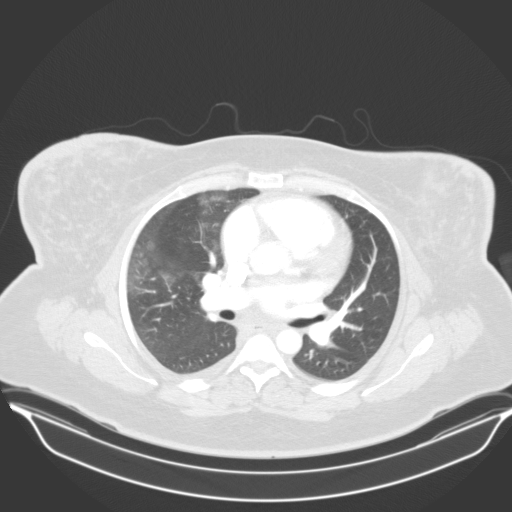
[im 87/130  mediastinal]
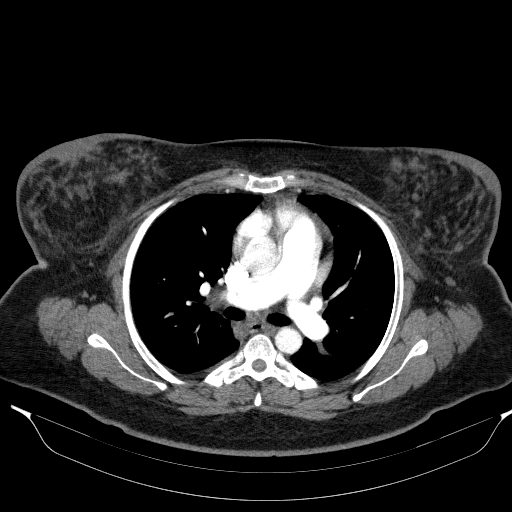
[im 96/130  lung]
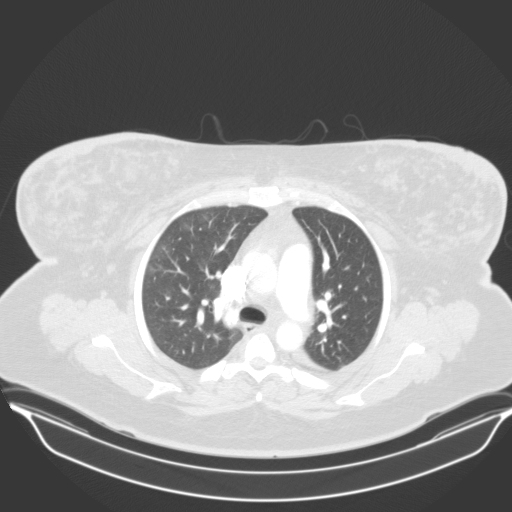
[im 104/130  mediastinal]
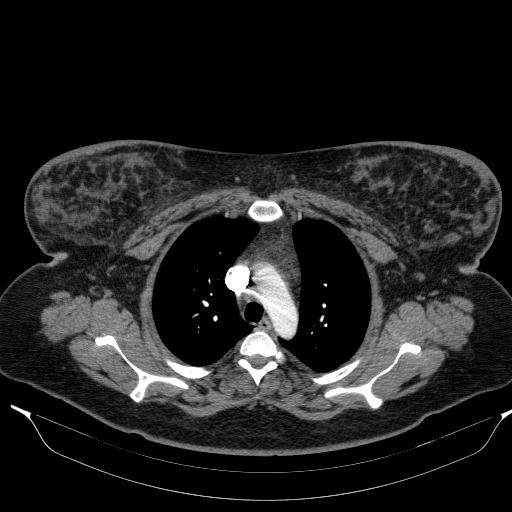
[im 106/130  lung]
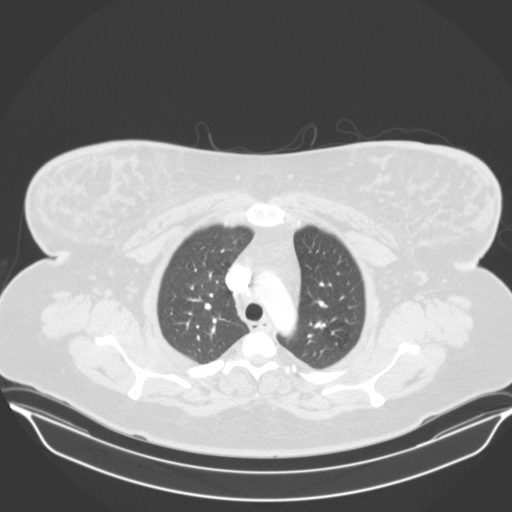
[im 115/130  mediastinal]
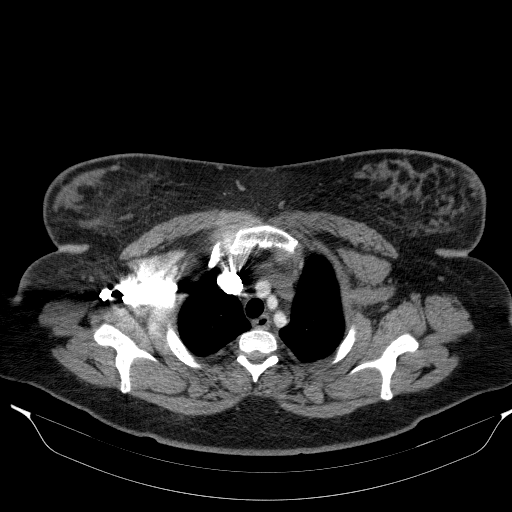
[im 125/130  lung]
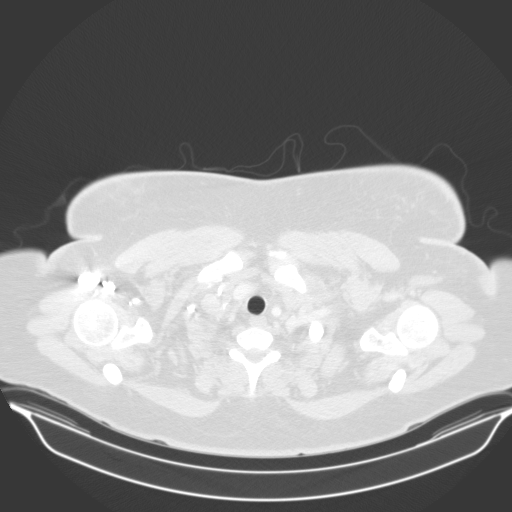

[19 of 34 positions shown; findings below may reference images not displayed]

FINDINGS: The pulmonary arteries are adequately opacified. There is no
evidence of pulmonary embolism. The thoracic aorta is normal in
caliber. Lungs show patchy alveolar airspace disease in the inferior
right upper lobe, right middle lobe and both lung bases. Associated
small bilateral pleural effusions are present. Findings likely
relate to mild pulmonary edema. Component of pneumonia cannot be
excluded, especially in the right middle lobe. No airway obstruction
identified. No evidence of pneumothorax.

No nodules or enlarged lymph nodes are seen. The heart size is
normal. No pericardial fluid is seen. Visualized upper abdominal
structures are unremarkable. No bony abnormalities are identified.

Review of the MIP images confirms the above findings.
IMPRESSION: 1. No evidence of pulmonary embolism.
2. Patchy alveolar airspace disease in the right upper lobe, right
middle lobe and both lower lobes. This is associated with small
bilateral pleural effusions and felt to likely relate to pulmonary
edema. Component of pneumonia cannot be completely excluded,
especially in the right middle lobe.

## 2015-02-10 MED ORDER — POLYSACCHARIDE IRON COMPLEX 150 MG PO CAPS
150.0000 mg | ORAL_CAPSULE | Freq: Two times a day (BID) | ORAL | Status: DC
  Administered 2015-02-10 – 2015-02-12 (×4): 150 mg via ORAL
  Filled 2015-02-10 (×6): qty 1

## 2015-02-10 MED ORDER — LACTATED RINGERS IV SOLN
INTRAVENOUS | Status: DC
  Administered 2015-02-10: 18:00:00 via INTRAVENOUS

## 2015-02-10 MED ORDER — POTASSIUM CHLORIDE ER 10 MEQ PO TBCR
20.0000 meq | EXTENDED_RELEASE_TABLET | Freq: Two times a day (BID) | ORAL | Status: DC
  Filled 2015-02-10 (×3): qty 2

## 2015-02-10 MED ORDER — FUROSEMIDE 10 MG/ML IJ SOLN
20.0000 mg | Freq: Once | INTRAMUSCULAR | Status: AC
  Administered 2015-02-10: 20 mg via INTRAVENOUS
  Filled 2015-02-10: qty 2

## 2015-02-10 MED ORDER — FUROSEMIDE 10 MG/ML IJ SOLN
40.0000 mg | Freq: Two times a day (BID) | INTRAMUSCULAR | Status: DC
  Administered 2015-02-10 – 2015-02-12 (×4): 40 mg via INTRAVENOUS
  Filled 2015-02-10 (×6): qty 4

## 2015-02-10 MED ORDER — IBUPROFEN 800 MG PO TABS
800.0000 mg | ORAL_TABLET | Freq: Three times a day (TID) | ORAL | Status: DC | PRN
  Administered 2015-02-10 – 2015-02-12 (×2): 800 mg via ORAL
  Filled 2015-02-10 (×2): qty 1

## 2015-02-10 MED ORDER — IOHEXOL 350 MG/ML SOLN
100.0000 mL | Freq: Once | INTRAVENOUS | Status: AC | PRN
  Administered 2015-02-10: 100 mL via INTRAVENOUS

## 2015-02-10 MED ORDER — POTASSIUM CHLORIDE CRYS ER 20 MEQ PO TBCR
40.0000 meq | EXTENDED_RELEASE_TABLET | Freq: Every day | ORAL | Status: DC
  Administered 2015-02-10 – 2015-02-12 (×3): 40 meq via ORAL
  Filled 2015-02-10 (×4): qty 2

## 2015-02-10 MED ORDER — LABETALOL HCL 5 MG/ML IV SOLN
100.0000 mg | Freq: Once | INTRAVENOUS | Status: DC
  Filled 2015-02-10 (×2): qty 20

## 2015-02-10 MED ORDER — LABETALOL HCL 100 MG PO TABS
100.0000 mg | ORAL_TABLET | Freq: Two times a day (BID) | ORAL | Status: DC
  Administered 2015-02-10 – 2015-02-12 (×5): 100 mg via ORAL
  Filled 2015-02-10 (×5): qty 1
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
## 2015-02-10 NOTE — Progress Notes (Signed)
Spoke with Dr weaver on call cardiologist regarding elev BNP and pt's condition of pulm edema, advised need echo which he will kindly order. Pt advised of plan of care

## 2015-02-10 NOTE — Progress Notes (Signed)
Notified anesthesia Dr Carren Rang for IV insertion.

## 2015-02-10 NOTE — Progress Notes (Signed)
  Echocardiogram 2D Echocardiogram has been performed.  Jennette Dubin 02/10/2015, 4:53 PM

## 2015-02-10 NOTE — Progress Notes (Signed)
  Echocardiogram 2D Echocardiogram has been performed.  Jennette Dubin 02/10/2015, 4:57 PM
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
## 2015-02-10 NOTE — MAU Note (Signed)
Diarrhea since I left here Thurs. SOB since 2330 when I woke up. Had primary C/S due to failure to progress. Gestational HTN.

## 2015-02-10 NOTE — Progress Notes (Signed)
Pt reported she is usually a hard stick, several sticks between 0430 and 0600 by lab (was able to get labwork) and 2 CRNAs but unable to get IV line.

## 2015-02-11 ENCOUNTER — Observation Stay (HOSPITAL_COMMUNITY): Payer: 59

## 2015-02-11 DIAGNOSIS — G43909 Migraine, unspecified, not intractable, without status migrainosus: Secondary | ICD-10-CM | POA: Diagnosis present

## 2015-02-11 DIAGNOSIS — O99214 Obesity complicating childbirth: Secondary | ICD-10-CM | POA: Diagnosis present

## 2015-02-11 DIAGNOSIS — Z3A00 Weeks of gestation of pregnancy not specified: Secondary | ICD-10-CM | POA: Diagnosis present

## 2015-02-11 DIAGNOSIS — E669 Obesity, unspecified: Secondary | ICD-10-CM | POA: Diagnosis present

## 2015-02-11 DIAGNOSIS — O139 Gestational [pregnancy-induced] hypertension without significant proteinuria, unspecified trimester: Secondary | ICD-10-CM | POA: Diagnosis present

## 2015-02-11 DIAGNOSIS — Z8249 Family history of ischemic heart disease and other diseases of the circulatory system: Secondary | ICD-10-CM | POA: Diagnosis not present

## 2015-02-11 DIAGNOSIS — Z801 Family history of malignant neoplasm of trachea, bronchus and lung: Secondary | ICD-10-CM | POA: Diagnosis not present

## 2015-02-11 DIAGNOSIS — J811 Chronic pulmonary edema: Secondary | ICD-10-CM | POA: Diagnosis present

## 2015-02-11 DIAGNOSIS — Z833 Family history of diabetes mellitus: Secondary | ICD-10-CM | POA: Diagnosis not present

## 2015-02-11 DIAGNOSIS — Z6841 Body Mass Index (BMI) 40.0 and over, adult: Secondary | ICD-10-CM | POA: Diagnosis not present

## 2015-02-11 DIAGNOSIS — O3421 Maternal care for scar from previous cesarean delivery: Secondary | ICD-10-CM | POA: Diagnosis present

## 2015-02-11 LAB — BASIC METABOLIC PANEL
Anion gap: 10 (ref 5–15)
BUN: 20 mg/dL (ref 6–20)
CO2: 24 mmol/L (ref 22–32)
CREATININE: 0.8 mg/dL (ref 0.44–1.00)
Calcium: 8.6 mg/dL — ABNORMAL LOW (ref 8.9–10.3)
Chloride: 106 mmol/L (ref 101–111)
GFR calc non Af Amer: >60 mL/min (ref >60)
GLUCOSE: 103 mg/dL — AB (ref 65–99)
POTASSIUM: 3.8 mmol/L (ref 3.5–5.1)
Sodium: 140 mmol/L (ref 135–145)

## 2015-02-11 LAB — MAGNESIUM: Magnesium: 1.9 mg/dL (ref 1.7–2.4)

## 2015-02-11 LAB — PHOSPHORUS: Phosphorus: 4.1 mg/dL (ref 2.5–4.6)

## 2015-02-11 MED ORDER — MAGNESIUM OXIDE 400 (241.3 MG) MG PO TABS
400.0000 mg | ORAL_TABLET | Freq: Every day | ORAL | Status: DC
  Administered 2015-02-11 – 2015-02-12 (×2): 400 mg via ORAL
  Filled 2015-02-11 (×3): qty 1

## 2015-02-11 NOTE — Progress Notes (Signed)
S: feels better 14 lb weight loss since yesterday Notes leg cramps  O: BP 136/99 mmHg  Pulse 72  Temp(Src) 99.1 F (37.3 C) (Oral)  Resp 18  Ht 5\' 2"  (1.575 m)  Wt 124.286 kg (274 lb)  BMI 50.10 kg/m2  SpO2 98%  Breastfeeding? Yes  Lungs clear to A Cor RRR w/o murmur Abdomen; soft  nontender Primary dressing intact Extr(+) 1 edema  2d echo: EF 60% Mild elev LVH nl cardiac NSR EKG  IMP: clinically improving pulmonary edema P) check BMP, Mg Phos level Due to leg cramping D/c wound dressing Recheck CXR in am

## 2015-02-11 NOTE — Progress Notes (Signed)
[place] wound dressing removed. Staples removed. Incision is clean, dry, and intact.

## 2015-02-12 ENCOUNTER — Inpatient Hospital Stay (HOSPITAL_COMMUNITY): Payer: 59

## 2015-02-12 IMAGING — CR DG CHEST 2V
2 series · 2 of 2 positions shown · non-contrast
Comparison: [DATE]

CLINICAL DATA: Shortness of breath. Followup pulmonary edema.
Postpartum from C-section delivery 1 week ago.

EXAM:
CHEST  2 VIEW

[chest pa]
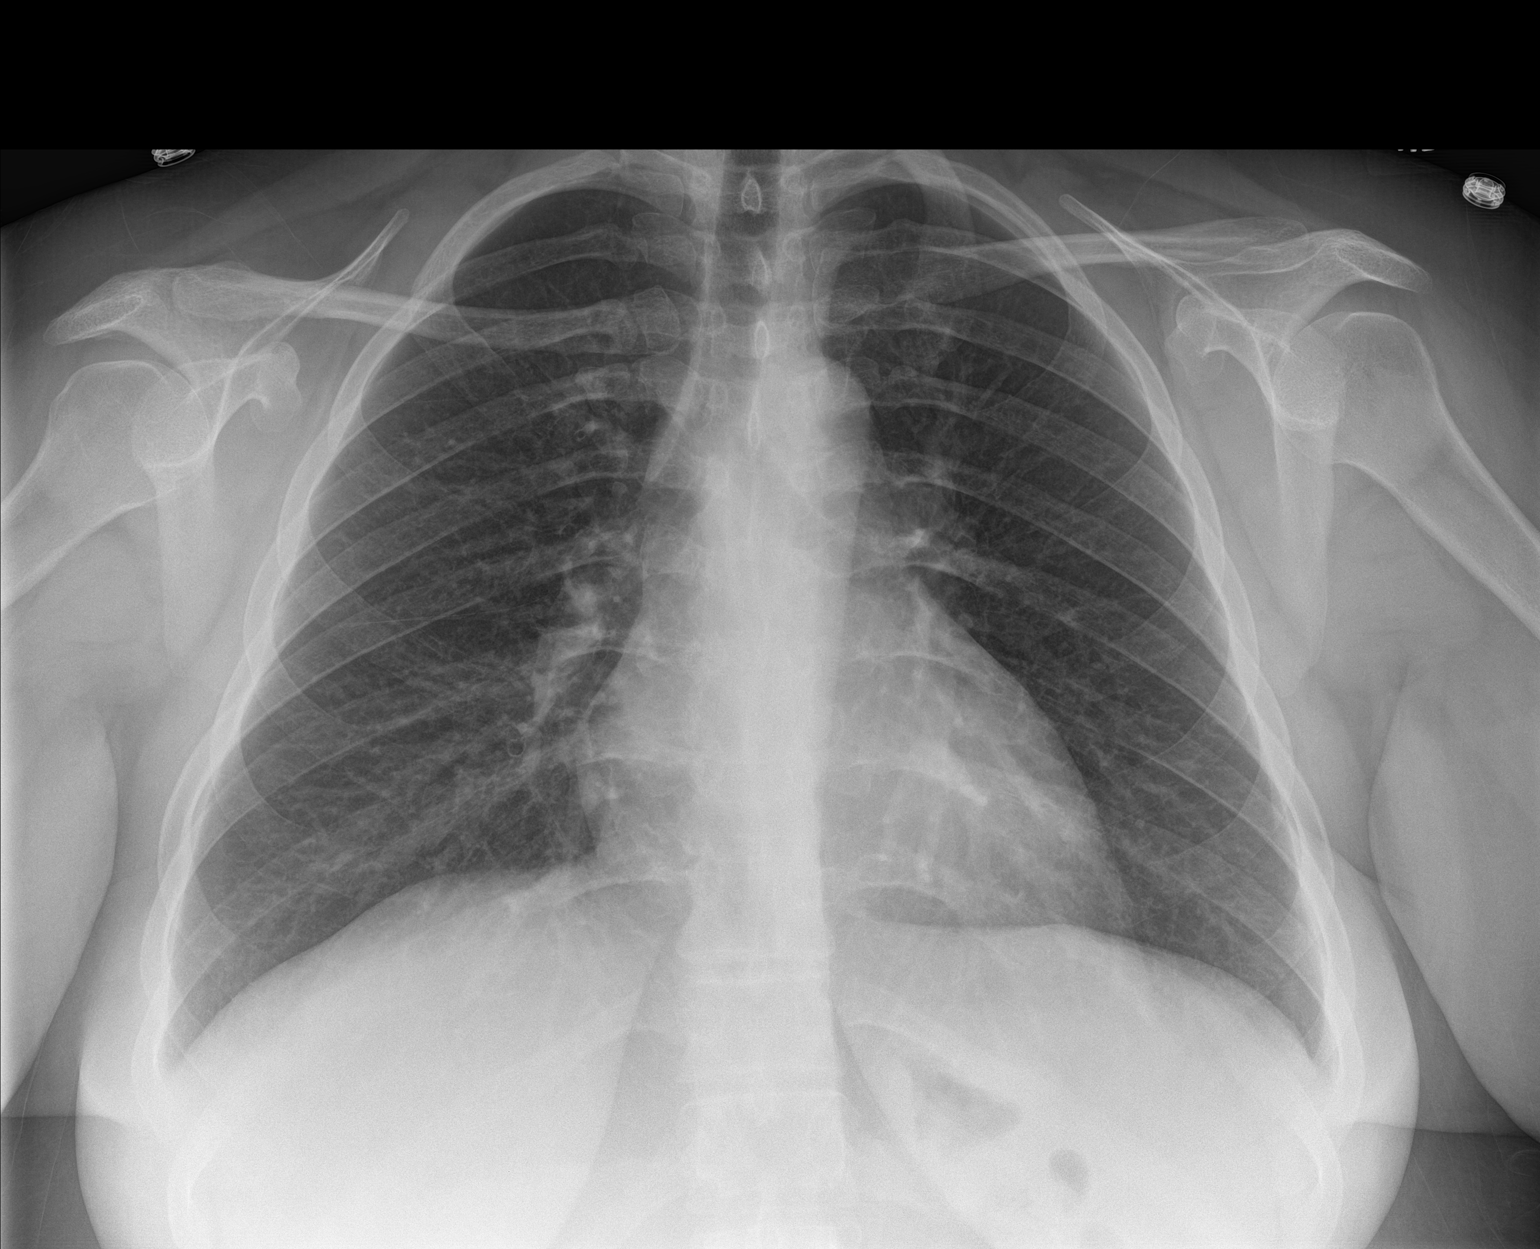

[chest lat]
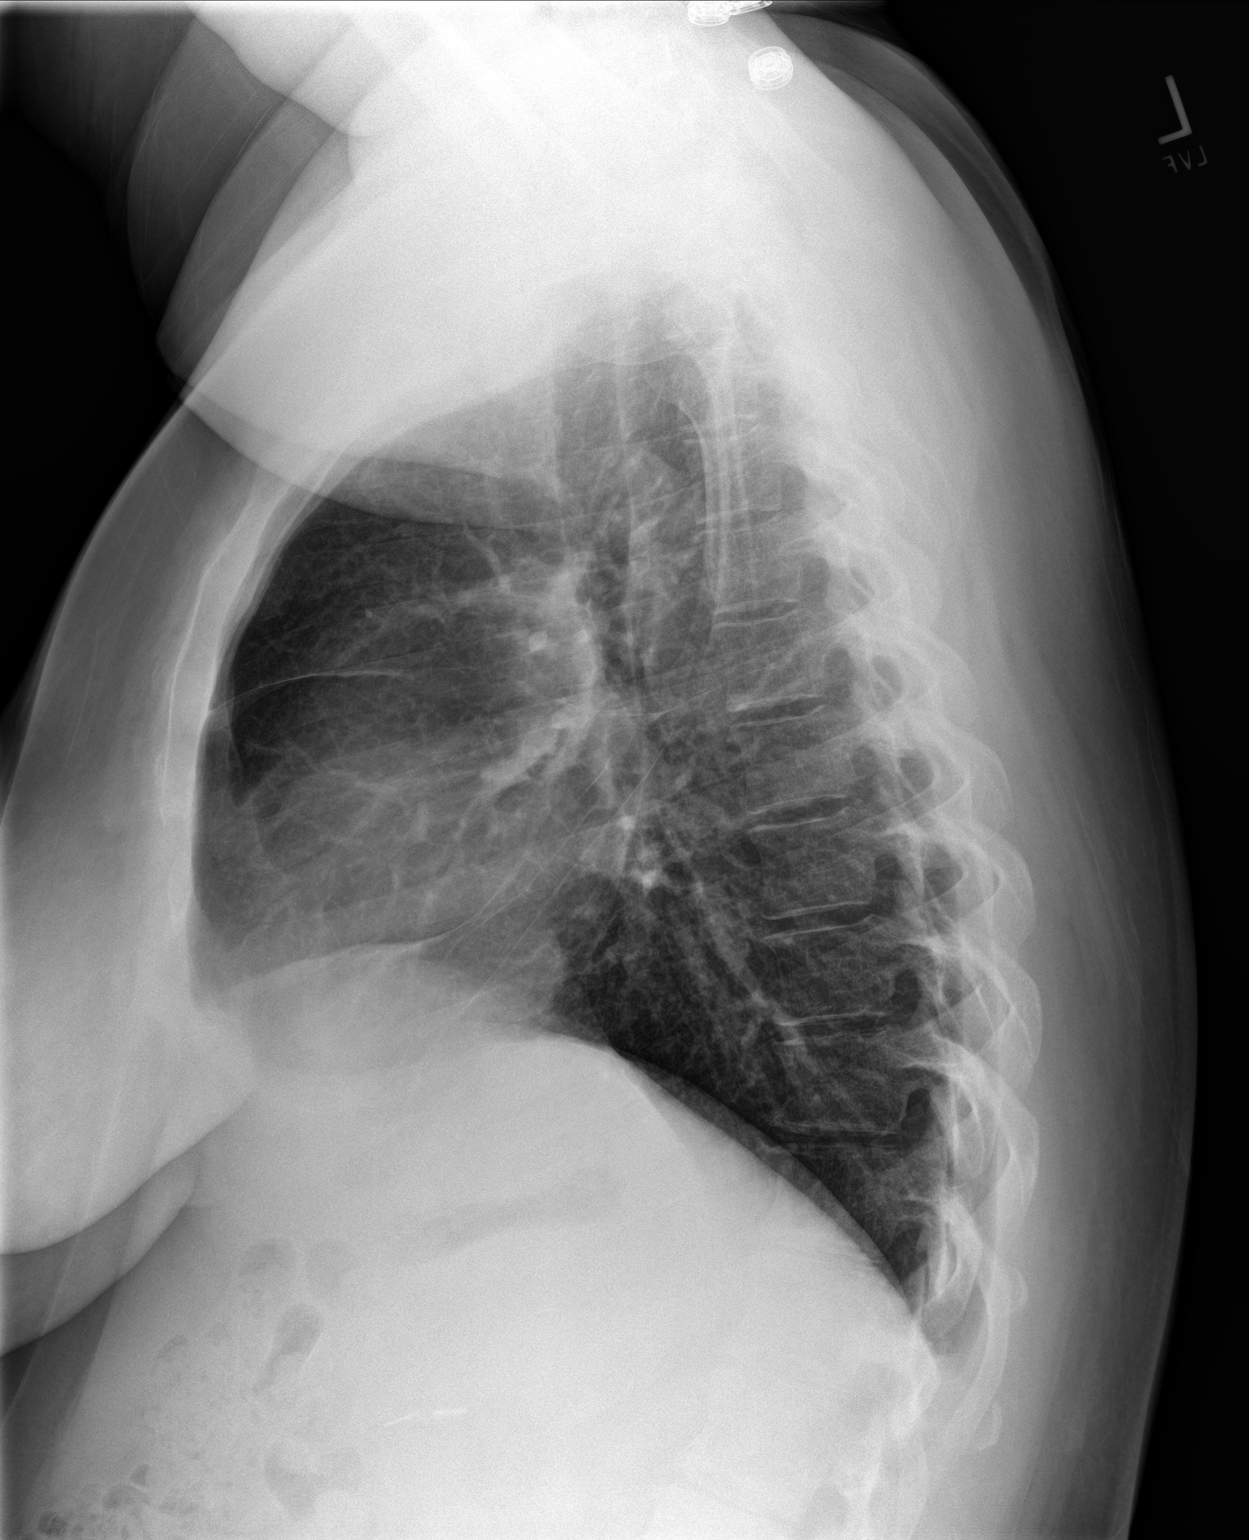

[2 of 2 positions shown; findings below may reference images not displayed]

FINDINGS: Lower lung airspace disease shows resolution since previous study.
No persistent areas of infiltrate seen. No evidence of pleural
effusion. Heart size and mediastinal contours are within normal
limits.
IMPRESSION: Resolution of lower lung airspace disease since previous study. No
active disease.

## 2015-02-12 MED ORDER — HYDROCHLOROTHIAZIDE 12.5 MG PO CAPS: 12.5000 mg | ORAL_CAPSULE | Freq: Every day | ORAL | Status: DC

## 2015-02-12 NOTE — Progress Notes (Signed)
Discharge teaching complete. Pt understood all instructions and did not have any questions/ Pt ambulated out of the hospital and discharged home to family.

## 2015-02-12 NOTE — Discharge Summary (Signed)
Physician Discharge Summary  Patient ID: Alisha Wood MRN: [id number] DOB/AGE: 30-Feb-1987 30 y.o.  Admit date: 02/10/2015 Discharge date: 02/12/2015  Admission Diagnoses: SOB/pulmonary edema, PIH, s/p C/S  Discharge Diagnoses: same Active Problems:   Pleural effusion   Pulmonary edema   Discharged Condition: stable  Hospital Course: pt presented to hospital with c/o acute SOB. Evaluation done including CXR showed pleural effusion and pulmonary edema. She also had CT scan to r/o PE which was neg and ECHO which did not show cardiomyopathy. Pt was treated with IV Lasix. Pt also had elevation of BP but nl PIH labs. She had elevated BNP. Pt was started back on  Labetalol. She diuresed well with a 24 lb weight loss  Consults: None  Significant Diagnostic Studies: radiology: CXR: pleural effusion: bilaterally and pulmonary edema and CT scan: spiral of chest  Treatments: IV lasix  Discharge Exam: Blood pressure 149/99, pulse 71, temperature 98.1 F (36.7 C), temperature source Oral, resp. rate 18, height 5\' 2"  (1.575 m), weight 120.4 kg (265 lb 6.9 oz), SpO2 100 %, currently breastfeeding. General appearance: alert, cooperative and no distress Cardio: regular rate and rhythm, S1, S2 normal, no murmur, click, rub or gallop GI: soft, non-tender; bowel sounds normal; no masses,  no organomegaly Extremities: edema trace Incision/Wound: dressing removed: incision. Well approximated no E/E/I  Disposition: 01-Home or Self Care  Discharge Instructions    Discharge patient    Complete by:  As directed             Medication List    TAKE these medications        ferrous sulfate 325 (65 FE) MG tablet  Take 1 tablet (325 mg total) by mouth daily.     hydrochlorothiazide 12.5 MG capsule  Commonly known as:  MICROZIDE  Take 1 capsule (12.5 mg total) by mouth daily.     ibuprofen 600 MG tablet  Commonly known as:  ADVIL,MOTRIN  Take 1 tablet (600 mg total) by mouth every 6 (six)  hours as needed for mild pain.     magnesium oxide 400 (241.3 MG) MG tablet  Commonly known as:  MAG-OX  Take 0.5 tablets (200 mg total) by mouth daily.     oxyCODONE-acetaminophen 5-325 MG per tablet  Commonly known as:  PERCOCET/ROXICET  Take 1-2 tablets by mouth every 4 (four) hours as needed (for pain scale greater than 7).     pantoprazole 40 MG tablet  Commonly known as:  PROTONIX  Take 40 mg by mouth at bedtime.     prenatal multivitamin Tabs tablet  Take 1 tablet by mouth at bedtime.           Follow-up Information    Follow up with Ahlivia Salahuddin A, MD In 1 week.   Specialty:  Obstetrics and Gynecology   Contact information:   [address] [place] [address] [phone]       Signed: Servando Salina A 02/12/2015, 2:09 PM

## 2015-02-12 NOTE — Discharge Instructions (Signed)
Same as pp Erling Conte booklet

## 2015-02-12 NOTE — Progress Notes (Signed)
POSTOPERATIVE DAY # 8  S/P CS Readmit for pleural effusion HD # 3   S:         Reports feeling much better / sleeping some             Tolerating po intake / no nausea / no vomiting / normal BM             Bleeding is spotting             Up ad lib / ambulatory/ voiding QS  Newborn at bedside - breastfeeding at intervals  O:  VS: BP 143/74 mmHg  Pulse 61  Temp(Src) 97.9 F (36.6 C) (Oral)  Resp 19  Ht 5\' 2"  (1.575 m)  Wt 120.4 kg (265 lb 6.9 oz)  BMI 48.54 kg/m2  SpO2 99%  Breastfeeding? Yes             BP range: 143/74 - 153/86 - 143/80 - 151/75                           I&O: net negative 4100 past 24 hours                       total net negative 11 liters since admission             Physical Exam:             Alert and Oriented X3  Lungs: Clear and unlabored               O2 sat 98-100% RA  Extremities: 1+ pedal and trace pre-tibial edema, no calf pain or tenderness  EXAM: CHEST 2 VIEW COMPARISON: 02/10/2015  FINDINGS: Lower lung airspace disease shows resolution since previous study. No persistent areas of infiltrate seen. No evidence of pleural effusion. Heart size and mediastinal contours are within normal limits.  IMPRESSION: Resolution of lower lung airspace disease since previous study. No active disease.  A:        Pleural effusion - resolving / status greatly improved            Dependent edema resolving            POD # 8 CS  P:        Continue current management             MD to evaluate later today for possible DC   Artelia Laroche CNM, MSN, [facility] 02/12/2015, 9:20 AM

## 2015-02-12 NOTE — Progress Notes (Signed)
S: feels well No SOB  O:  Filed Vitals:   02/12/15 0153 02/12/15 0541 02/12/15 0900 02/12/15 1400  BP: 143/80 153/86 143/74 149/99  Pulse: 68 67 61 71  Temp: 97.7 F (36.5 C) 97.9 F (36.6 C)  98.1 F (36.7 C)  TempSrc: Oral Oral  Oral  Resp: 20 19  18   Height:      Weight:  120.4 kg (265 lb 6.9 oz)    SpO2: 100% 99%  100%  Lungs clear to A Cor RRR Abd soft, obese nontender Incision: dressing off. No E/I/E Extr: trace-1+ edema  Repeat CXR negative  magnesium, phosphorus nl   IMP: pulmonary edema resolved PIH labs on labetalol P) d/c home . Cont with pp booklet instructions. BP check one week

## 2015-02-13 LAB — STOOL CULTURE: Special Requests: NORMAL

## 2015-02-23 ENCOUNTER — Encounter: Payer: Self-pay | Admitting: Primary Care

## 2015-02-23 ENCOUNTER — Ambulatory Visit (INDEPENDENT_AMBULATORY_CARE_PROVIDER_SITE_OTHER): Payer: 59 | Admitting: Primary Care

## 2015-02-23 VITALS — BP 138/86 | HR 98 | Temp 98.4°F | Ht 62.0 in | Wt 251.8 lb

## 2015-02-23 DIAGNOSIS — J069 Acute upper respiratory infection, unspecified: Secondary | ICD-10-CM | POA: Diagnosis not present

## 2015-02-23 NOTE — Progress Notes (Signed)
Pre visit review using our clinic review tool, if applicable. No additional management support is needed unless otherwise documented below in the visit note. 
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
## 2015-02-23 NOTE — Patient Instructions (Signed)
Your symptoms are related to a viral illness. You will need to use precautions when around your infant by covering your mouth when coughing and during breast feeding.  You may take Robitussin DM or Mucinex DM over the counter for chest congestion and cough.   If you have no improvement in your symptoms on Tuesday next week, then please give me a call.  Increase your intake of water.  It was a pleasure meeting you!  Upper Respiratory Infection, Adult An upper respiratory infection (URI) is also sometimes known as the common cold. The upper respiratory tract includes the nose, sinuses, throat, trachea, and bronchi. Bronchi are the airways leading to the lungs. Most people improve within 1 week, but symptoms can last up to 2 weeks. A residual cough may last even longer.  CAUSES Many different viruses can infect the tissues lining the upper respiratory tract. The tissues become irritated and inflamed and often become very moist. Mucus production is also common. A cold is contagious. You can easily spread the virus to others by oral contact. This includes kissing, sharing a glass, coughing, or sneezing. Touching your mouth or nose and then touching a surface, which is then touched by another person, can also spread the virus. SYMPTOMS  Symptoms typically develop 1 to 3 days after you come in contact with a cold virus. Symptoms vary from person to person. They may include:  Runny nose.  Sneezing.  Nasal congestion.  Sinus irritation.  Sore throat.  Loss of voice (laryngitis).  Cough.  Fatigue.  Muscle aches.  Loss of appetite.  Headache.  Low-grade fever. DIAGNOSIS  You might diagnose your own cold based on familiar symptoms, since most people get a cold 2 to 3 times a year. Your caregiver can confirm this based on your exam. Most importantly, your caregiver can check that your symptoms are not due to another disease such as strep throat, sinusitis, pneumonia, asthma, or  epiglottitis. Blood tests, throat tests, and X-rays are not necessary to diagnose a common cold, but they may sometimes be helpful in excluding other more serious diseases. Your caregiver will decide if any further tests are required. RISKS AND COMPLICATIONS  You may be at risk for a more severe case of the common cold if you smoke cigarettes, have chronic heart disease (such as heart failure) or lung disease (such as asthma), or if you have a weakened immune system. The very young and very old are also at risk for more serious infections. Bacterial sinusitis, middle ear infections, and bacterial pneumonia can complicate the common cold. The common cold can worsen asthma and chronic obstructive pulmonary disease (COPD). Sometimes, these complications can require emergency medical care and may be life-threatening. PREVENTION  The best way to protect against getting a cold is to practice good hygiene. Avoid oral or hand contact with people with cold symptoms. Wash your hands often if contact occurs. There is no clear evidence that vitamin C, vitamin E, echinacea, or exercise reduces the chance of developing a cold. However, it is always recommended to get plenty of rest and practice good nutrition. TREATMENT  Treatment is directed at relieving symptoms. There is no cure. Antibiotics are not effective, because the infection is caused by a virus, not by bacteria. Treatment may include:  Increased fluid intake. Sports drinks offer valuable electrolytes, sugars, and fluids.  Breathing heated mist or steam (vaporizer or shower).  Eating chicken soup or other clear broths, and maintaining good nutrition.  Getting plenty of rest.  Using gargles or lozenges for comfort.  Controlling fevers with ibuprofen or acetaminophen as directed by your caregiver.  Increasing usage of your inhaler if you have asthma. Zinc gel and zinc lozenges, taken in the first 24 hours of the common cold, can shorten the duration  and lessen the severity of symptoms. Pain medicines may help with fever, muscle aches, and throat pain. A variety of non-prescription medicines are available to treat congestion and runny nose. Your caregiver can make recommendations and may suggest nasal or lung inhalers for other symptoms.  HOME CARE INSTRUCTIONS   Only take over-the-counter or prescription medicines for pain, discomfort, or fever as directed by your caregiver.  Use a warm mist humidifier or inhale steam from a shower to increase air moisture. This may keep secretions moist and make it easier to breathe.  Drink enough water and fluids to keep your urine clear or pale yellow.  Rest as needed.  Return to work when your temperature has returned to normal or as your caregiver advises. You may need to stay home longer to avoid infecting others. You can also use a face mask and careful hand washing to prevent spread of the virus. SEEK MEDICAL CARE IF:   After the first few days, you feel you are getting worse rather than better.  You need your caregiver's advice about medicines to control symptoms.  You develop chills, worsening shortness of breath, or brown or red sputum. These may be signs of pneumonia.  You develop yellow or brown nasal discharge or pain in the face, especially when you bend forward. These may be signs of sinusitis.  You develop a fever, swollen neck glands, pain with swallowing, or white areas in the back of your throat. These may be signs of strep throat. SEEK IMMEDIATE MEDICAL CARE IF:   You have a fever.  You develop severe or persistent headache, ear pain, sinus pain, or chest pain.  You develop wheezing, a prolonged cough, cough up blood, or have a change in your usual mucus (if you have chronic lung disease).  You develop sore muscles or a stiff neck. Document Released: 12/31/2000 Document Revised: 09/29/2011 Document Reviewed: 10/12/2013 [facility] Patient Information 2015 [place], [place]. This  information is not intended to replace advice given to you by your health care provider. Make sure you discuss any questions you have with your health care provider.

## 2015-08-31 MED FILL — PANTOPRAZOLE SOD DR 40 MG T: 40 | 30 days supply | Qty: 30 | Fill #5

## 2015-08-31 MED FILL — NORETHINDRONE 0.35 MG TAB: 0.35 | 84 days supply | Qty: 84 | Fill #2

## 2015-09-03 DIAGNOSIS — H5203 Hypermetropia, bilateral: Secondary | ICD-10-CM | POA: Diagnosis not present

## 2015-09-03 DIAGNOSIS — H52223 Regular astigmatism, bilateral: Secondary | ICD-10-CM | POA: Diagnosis not present

## 2015-12-07 MED FILL — NORETHINDRONE 0.35 MG TAB: 0.35 | 84 days supply | Qty: 84 | Fill #3

## 2016-02-22 MED FILL — NORETHINDRONE 0.35 MG TAB: 0.35 | 84 days supply | Qty: 84 | Fill #0

## 2016-03-17 ENCOUNTER — Encounter: Payer: Self-pay | Admitting: Family Medicine

## 2016-03-17 ENCOUNTER — Ambulatory Visit (INDEPENDENT_AMBULATORY_CARE_PROVIDER_SITE_OTHER): Payer: 59 | Admitting: Family Medicine

## 2016-03-17 DIAGNOSIS — O139 Gestational [pregnancy-induced] hypertension without significant proteinuria, unspecified trimester: Secondary | ICD-10-CM | POA: Diagnosis not present

## 2016-03-17 MED ORDER — PHENTERMINE HCL 30 MG PO TBDP: 1.0000 | ORAL_TABLET | Freq: Every day | ORAL | 1 refills | Status: DC

## 2016-03-17 MED FILL — PHENTERMINE 30 MG CAPSULE: 30 | 30 days supply | Qty: 30 | Fill #0

## 2016-03-17 NOTE — Progress Notes (Signed)
   BP 126/82   Pulse 88   Temp 98.9 F (37.2 C) (Oral)   Ht 5\' 2"  (1.575 m)   Wt 271 lb 4 oz (123 kg)   LMP 04/04/2015   BMI 49.61 kg/m    CC: discuss obesity Subjective:    Patient ID: Alisha Wood, female    DOB: [birth year], 30 y.o.   MRN: [id number]  HPI: Alisha Wood is a 30 y.o. female presenting on 03/17/2016 for Discuss weightloss   Here with son who is 30 month old. Increased stress - son has had food aversion. 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 He's unable to attend daycare as he does not take bottle or baby food.   Ongoing struggles with weight loss. At peak weight, has gained 30+ lbs since son's birth.  Has tried multiple regimens.  1300-1500 cal/day Exercise videos at home Walking  Has tried dutera herbal supplements (slim and sassy).   Relevant past medical, surgical, family and social history reviewed and updated as indicated. Interim medical history since our last visit reviewed. Allergies and medications reviewed and updated. No current outpatient prescriptions on file prior to visit.   No current facility-administered medications on file prior to visit.     Review of Systems Per HPI unless specifically indicated in ROS section     Objective:    BP 126/82   Pulse 88   Temp 98.9 F (37.2 C) (Oral)   Ht 5\' 2"  (1.575 m)   Wt 271 lb 4 oz (123 kg)   LMP 04/04/2015   BMI 49.61 kg/m   Wt Readings from Last 3 Encounters:  03/17/16 271 lb 4 oz (123 kg)  02/23/15 251 lb 12.8 oz (114.2 kg)  02/12/15 265 lb 6.9 oz (120.4 kg)    Physical Exam  Constitutional: She appears well-developed and well-nourished. No distress.  Psychiatric: She has a normal mood and affect.  Nursing note and vitals reviewed.     Assessment & Plan:  Pt will call us with name of OCP she currently takes. Problem List Items Addressed This Visit    PIH (pregnancy induced hypertension)   Severe obesity (BMI >= 40) (HCC) - Primary    Poor meal schedule, often skips meals.  Discussed importance of healthy diet changes and regular exercise routine.  Discussed pros/cons of bariatric pharmacotherapy as well as different options. Discussed risks and side effects and mechanism of action of medications. Pt opts to try phentermine 30mg  daily with understanding for temporary course.  In h/o PIH, pt aware to monitor blood pressure on phentermine and will stop and notify me right away if hypertension develops.  RTC 1 mo f/u visit.       Relevant Medications   Phentermine HCl 30 MG TBDP    Other Visit Diagnoses   None.      Follow up plan: Return in about 4 weeks (around 04/14/2016) for follow up visit.  [name], MD

## 2016-03-17 NOTE — Patient Instructions (Signed)
We will trial phentermine 30mg  tablets. Start at 1/2 tablet for a few days then may increase to 1 tablet daily.  Work on increased regular activity as well as regular meal times.  Return in 1 month for follow up.

## 2016-03-17 NOTE — Progress Notes (Signed)
Pre visit review using our clinic review tool, if applicable. No additional management support is needed unless otherwise documented below in the visit note. 

## 2016-03-17 NOTE — Assessment & Plan Note (Addendum)
Poor meal schedule, often skips meals.  Discussed importance of healthy diet changes and regular exercise routine.  Discussed pros/cons of bariatric pharmacotherapy as well as different options. Discussed risks and side effects and mechanism of action of medications. Pt opts to try phentermine 30mg  daily with understanding for temporary course.  In h/o PIH, pt aware to monitor blood pressure on phentermine and will stop and notify me right away if hypertension develops.  RTC 1 mo f/u visit.

## 2016-03-19 ENCOUNTER — Telehealth: Payer: Self-pay

## 2016-03-19 NOTE — Telephone Encounter (Signed)
Pt left v/m; pt was to cb with name of BC taking; pt is taking Norethindrone 0.35 mg taking one daily. OB prescribes but Dr Darnell Level wanted on current med list. Added to med list and to Dr Darnell Level as Juluis Rainier.

## 2016-04-03 DIAGNOSIS — Z1151 Encounter for screening for human papillomavirus (HPV): Secondary | ICD-10-CM | POA: Diagnosis not present

## 2016-04-03 DIAGNOSIS — Z01419 Encounter for gynecological examination (general) (routine) without abnormal findings: Secondary | ICD-10-CM | POA: Diagnosis not present

## 2016-04-17 ENCOUNTER — Ambulatory Visit (INDEPENDENT_AMBULATORY_CARE_PROVIDER_SITE_OTHER): Payer: 59 | Admitting: Family Medicine

## 2016-04-17 ENCOUNTER — Encounter: Payer: Self-pay | Admitting: Family Medicine

## 2016-04-17 DIAGNOSIS — O139 Gestational [pregnancy-induced] hypertension without significant proteinuria, unspecified trimester: Secondary | ICD-10-CM

## 2016-04-17 MED ORDER — PHENTERMINE HCL 30 MG PO TBDP: 1.0000 | ORAL_TABLET | Freq: Every day | ORAL | 1 refills | Status: DC

## 2016-04-17 NOTE — Assessment & Plan Note (Signed)
Doing well with phentermine 30mg  daily - tolerating well, good effect (15 lb weight loss over first month). Continue this. RTC 35mo f/u visit. Pt agrees with plan. Reviewed diet and recommended increased activity.

## 2016-04-17 NOTE — Progress Notes (Signed)
   BP 126/66   Pulse 64   Temp 98.7 F (37.1 C) (Oral)   Wt 256 lb 8 oz (116.3 kg)   BMI 46.91 kg/m    CC: 1 mo f/u visit Subjective:    Patient ID: Alisha Wood, female    DOB: [birth year], 30 y.o.   MRN: [id number]  HPI: Alisha Wood is a 30 y.o. female presenting on 04/17/2016 for Follow-up   See prior note for details. Last visit we started phentermine for weight loss. In h/o PIH, bp has remained stable on this. Well controlled at home as well. No headaches, insomnia, chest pain.   15 lb weight loss noted. Continues counting calories, has 3 meals a day.  Wants to restart walking. She does get 10,000 + steps per day on weekends.  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 Son with food aversion - stress from this. Overall doing well.   Relevant past medical, surgical, family and social history reviewed and updated as indicated. Interim medical history since our last visit reviewed. Allergies and medications reviewed and updated. Current Outpatient Prescriptions on File Prior to Visit  Medication Sig  . norethindrone (MICRONOR,CAMILA,ERRIN) 0.35 MG tablet Take 1 tablet by mouth daily.   No current facility-administered medications on file prior to visit.     Review of Systems Per HPI unless specifically indicated in ROS section     Objective:    BP 126/66   Pulse 64   Temp 98.7 F (37.1 C) (Oral)   Wt 256 lb 8 oz (116.3 kg)   BMI 46.91 kg/m   Wt Readings from Last 3 Encounters:  04/17/16 256 lb 8 oz (116.3 kg)  03/17/16 271 lb 4 oz (123 kg)  02/23/15 251 lb 12.8 oz (114.2 kg)    Physical Exam  Constitutional: She appears well-developed and well-nourished. No distress.  HENT:  Mouth/Throat: Oropharynx is clear and moist. No oropharyngeal exudate.  Cardiovascular: Normal rate, regular rhythm, normal heart sounds and intact distal pulses.   No murmur heard. Pulmonary/Chest: Effort normal and breath  sounds normal. No respiratory distress. She has no wheezes. She has no rales.  Musculoskeletal: She exhibits no edema.  Skin: Skin is warm and dry. No rash noted.  Psychiatric: She has a normal mood and affect.  Nursing note and vitals reviewed.     Assessment & Plan:   Problem List Items Addressed This Visit    PIH (pregnancy induced hypertension)    No signs of HTN.       Severe obesity (BMI >= 40) (HCC)    Doing well with phentermine 30mg  daily - tolerating well, good effect (15 lb weight loss over first month). Continue this. RTC 103mo f/u visit. Pt agrees with plan. Reviewed diet and recommended increased activity.       Relevant Medications   Phentermine HCl 30 MG TBDP    Other Visit Diagnoses   None.      Follow up plan: Return in about 2 months (around 06/17/2016) for follow up visit.  Alisha Bush, MD

## 2016-04-17 NOTE — Patient Instructions (Signed)
You are doing well today. Keep up the good work.  Try to incorporate more walking into routine.  Phentermine refilled x 2 months Return for 2 month f/u visit.

## 2016-04-17 NOTE — Assessment & Plan Note (Signed)
No signs of HTN.

## 2016-04-17 NOTE — Progress Notes (Signed)
Pre visit review using our clinic review tool, if applicable. No additional management support is needed unless otherwise documented below in the visit note. 

## 2016-04-18 MED FILL — PHENTERMINE 30 MG CAPSULE: 30 | 30 days supply | Qty: 30 | Fill #1

## 2016-05-19 MED FILL — HEATHER TABLET: 0.35 | 84 days supply | Qty: 84 | Fill #0

## 2016-05-19 MED FILL — PHENTERMINE 30 MG CAPSULE: 30 | 30 days supply | Qty: 30 | Fill #0

## 2016-06-17 ENCOUNTER — Ambulatory Visit (INDEPENDENT_AMBULATORY_CARE_PROVIDER_SITE_OTHER): Payer: 59 | Admitting: Family Medicine

## 2016-06-17 DIAGNOSIS — O139 Gestational [pregnancy-induced] hypertension without significant proteinuria, unspecified trimester: Secondary | ICD-10-CM

## 2016-06-17 DIAGNOSIS — K760 Fatty (change of) liver, not elsewhere classified: Secondary | ICD-10-CM | POA: Diagnosis not present

## 2016-06-17 MED ORDER — PHENTERMINE HCL 30 MG PO TBDP
1.0000 | ORAL_TABLET | Freq: Every day | ORAL | 1 refills | Status: DC
Start: 2016-06-17 — End: 2017-04-21

## 2016-06-17 NOTE — Progress Notes (Signed)
Pre visit review using our clinic review tool, if applicable. No additional management support is needed unless otherwise documented below in the visit note. 

## 2016-06-17 NOTE — Progress Notes (Signed)
BP 126/86 (BP Location: Left Arm, Patient Position: Sitting, Cuff Size: Large)   Pulse 81   Temp 98.6 F (37 C) (Oral)   Resp 16   Ht 5\' 2"  (1.575 m)   Wt 249 lb (112.9 kg)   SpO2 98%   BMI 45.54 kg/m    CC: 2 mo f/u visit Subjective:    Patient ID: Alisha Wood, female    DOB: 03/14/1986, 30 y.o.   MRN: [id number]  HPI: Alisha Wood is a 30 y.o. female presenting on 06/17/2016 for Follow-up (only taking  phentermine 3 x weekly )   See prior note for details. Phentermine started 02/2016 for weight loss. In h/o PIH, bp has remained stable on this. Well controlled at home as well. No headaches, insomnia, chest pain.  She has decreased phentermine to QOD because of breakthrough bleeding on birth control.   22 lb weight loss noted. Would like nutritionist referral.  Continues counting calories, has 3 meals a day.  Wants to restart walking. She does get 10,000 + steps per day on weekends.   Continues struggling with eating healthy with child with food aversions.   Relevant past medical, surgical, family and social history reviewed and updated as indicated. Interim medical history since our last visit reviewed. Allergies and medications reviewed and updated. Current Outpatient Prescriptions on File Prior to Visit  Medication Sig  . norethindrone (MICRONOR,CAMILA,ERRIN) 0.35 MG tablet Take 1 tablet by mouth daily.   No current facility-administered medications on file prior to visit.     Review of Systems Per HPI unless specifically indicated in ROS section     Objective:    BP 126/86 (BP Location: Left Arm, Patient Position: Sitting, Cuff Size: Large)   Pulse 81   Temp 98.6 F (37 C) (Oral)   Resp 16   Ht 5\' 2"  (1.575 m)   Wt 249 lb (112.9 kg)   SpO2 98%   BMI 45.54 kg/m   Wt Readings from Last 3 Encounters:  06/17/16 249 lb (112.9 kg)  04/17/16 256 lb 8 oz (116.3 kg)  03/17/16 271 lb 4 oz (123 kg)    Physical Exam  Constitutional: She appears  well-developed and well-nourished. No distress.  Cardiovascular: Normal rate, regular rhythm, normal heart sounds and intact distal pulses.   No murmur heard. Pulmonary/Chest: Effort normal. No respiratory distress. She has no wheezes. She has no rales.  Musculoskeletal: She exhibits no edema.  Psychiatric: She has a normal mood and affect.  Nursing note and vitals reviewed.      Assessment & Plan:   Problem List Items Addressed This Visit    NAFLD (nonalcoholic fatty liver disease)   Relevant Orders   Amb ref to Medical Nutrition Therapy-MNT   PIH (pregnancy induced hypertension)   Relevant Orders   Amb ref to Medical Nutrition Therapy-MNT   Severe obesity (BMI >= 40) ([facility]) - Primary    Continued weight loss. Daily phentermine caused breakthrough menstrual bleeding, tolerating QOD dosing better. Continue this for next 2 months then consider stopping (will have been on 6 mo in 08/2016). Will refer to nutritionist for further support in anticipation of discontinuing bariatric medication. Pt agrees with plan.      Relevant Medications   Phentermine HCl 30 MG TBDP   Other Relevant Orders   Amb ref to Medical Nutrition Therapy-MNT       Follow up plan: Return in about 3 months (around 09/17/2016) for follow up visit.  [name], MD

## 2016-06-17 NOTE — Patient Instructions (Signed)
We will refer you to nutritionist.  Congratulations on ongoing weight loss! Continue phentermine every other day for now.  Return in 2-3 months for follow up visit.

## 2016-06-17 NOTE — Assessment & Plan Note (Signed)
Continued weight loss. Daily phentermine caused breakthrough menstrual bleeding, tolerating QOD dosing better. Continue this for next 2 months then consider stopping (will have been on 6 mo in 08/2016). Will refer to nutritionist for further support in anticipation of discontinuing bariatric medication. Pt agrees with plan.

## 2016-07-08 ENCOUNTER — Encounter: Payer: 59 | Attending: Family Medicine | Admitting: Dietician

## 2016-07-08 ENCOUNTER — Encounter: Payer: Self-pay | Admitting: Family Medicine

## 2016-07-08 DIAGNOSIS — Z713 Dietary counseling and surveillance: Secondary | ICD-10-CM | POA: Diagnosis not present

## 2016-07-08 DIAGNOSIS — E669 Obesity, unspecified: Secondary | ICD-10-CM | POA: Insufficient documentation

## 2016-07-08 NOTE — Progress Notes (Signed)
Medical Nutrition Therapy: Visit start time: 10:20am  end time: 11:30am Assessment:   Diagnosis: obesity Psychosocial issues/ stress concerns:  Patient describes her stress as moderate and indicates that she copes by eating. Preferred learning method:  . Auditory . Hands-on  Current weight:251 lbs Height:62 in Medications, supplements:see list  Progress and evaluation:  Patient in for initial medical nutrition therapy appointment.  She states "I have been overweight all of my life". She states she followed a Weight Watcher's plan in 2012 and lost to her lowest adult weight of 194 lbs. She then gained 50 lbs in 2013.  She began taking phentermine 02/2016 which she states has significantly decreased her appetite and she has lost approximately 22 lbs. Problem areas are frequent dining out or "take outs"; [place] for lunch on most days and Fast Food choices for at least 50% of evening meals. Patient also states that she adds cheese or cheese sauce or white sauce to most meals. Patient has a 9 month old son who she state has an eating disorder and requires a lot of time to feed and help him eat.  Her main beverage is diet soda which she drinks 8+ cups per day.  Her present diet is low in fruits/vegetables, whole grains and calcium sources. Physical activity: none  Nutrition Care Education: Weight control: Focused on healthier "take out" choices and strategies since patient feels that frequent dining out or mainly "take out" meals will continue. Discussed significant calories that sauces, dressings and other fats add.  Used food guide plate and food models to discuss how to better balance meals or foods she could keep at home to quickly add to entrees she purchases. Instructed on "Planning a Balanced Meal" including carbohydrate counting (based on an 1800 calorie meal plan). Gave and reviewed "Quick and Healthy Meals" and discussed simple meals that could be prepared at home. Discussed  strategies verses relying on will power.  Nutritional Diagnosis:  [place]-3.3 Overweight/obesity As related to frequent high fat meals when dining out or "take out"; excessive high fat sauces and other added fats..  As evidenced by diet history.  Intervention:  Establish a meal pattern of 3 meals and 2-3 snacks. Space meals 4-6 hours apart. Balance meals with protein, 2-4 (30-60 gms) servings of carbohydrate, fat and non-starchy vegetables. Limit added fats especially cheese sauces and white sauces. Use the fork method with salad dressings. Make healthier decisions at [employer] such as grilled chicken salad. Add salsa verses cheese sauce. Choose the bean/rice tortillas verses a higher volume/calorie entree. Prepare more dinner meals at home. Refer to quick and healthy meals.  Education Materials given:  . Plate Planner . Food lists/ Planning A Balanced Meal . Sample meal pattern/ menus . Goals/ instructions  Learner/ who was taught:  . Patient  Level of understanding: . Partial understanding; needs review/ practice Learning barriers: . None  Willingness to learn/ readiness for change: . Acceptance, ready for change  Monitoring and Evaluation:  Patient did not schedule a follow-up appointment. Encouraged her to call if desires further help with her diet/nutrition.

## 2016-07-08 NOTE — Patient Instructions (Addendum)
Establish a meal pattern of 3 meals and 2-3 snacks. Space meals 4-6 hours apart. Balance meals with protein, 2-4 (30-60 gms) servings of carbohydrate, fat and non-starchy vegetables. Limit added fats especially cheese sauces and white sauces. Use the fork method with salad dressings. Make healthier decisions at [employer] such as grilled chicken salad. Add salsa verses cheese sauce. Choose the bean/rice tortillas verses a higher volume/calorie entree. Prepare more dinner meals at home. Refer to quick and healthy meals.

## 2016-08-05 MED FILL — JOLESSA 0.15 MG-0.03 MG TAB: 0.15-0.03 | 91 days supply | Qty: 91 | Fill #0

## 2016-08-11 ENCOUNTER — Encounter: Payer: Self-pay | Admitting: Family Medicine

## 2016-08-12 ENCOUNTER — Ambulatory Visit: Payer: 59 | Admitting: Family Medicine

## 2016-09-03 DIAGNOSIS — H52212 Irregular astigmatism, left eye: Secondary | ICD-10-CM | POA: Diagnosis not present

## 2016-09-03 DIAGNOSIS — H5203 Hypermetropia, bilateral: Secondary | ICD-10-CM | POA: Diagnosis not present

## 2016-11-04 DIAGNOSIS — R300 Dysuria: Secondary | ICD-10-CM | POA: Diagnosis not present

## 2016-11-04 DIAGNOSIS — Z3041 Encounter for surveillance of contraceptive pills: Secondary | ICD-10-CM | POA: Diagnosis not present

## 2016-11-04 MED FILL — JOLESSA 0.15 MG-0.03 MG TAB: 0.15-0.03 | 91 days supply | Qty: 91 | Fill #0

## 2017-02-10 MED FILL — JOLESSA 0.15 MG-0.03 MG TAB: 0.15-0.03 | 91 days supply | Qty: 91 | Fill #1

## 2017-03-10 ENCOUNTER — Encounter: Payer: Self-pay | Admitting: Family Medicine

## 2017-03-31 ENCOUNTER — Encounter (INDEPENDENT_AMBULATORY_CARE_PROVIDER_SITE_OTHER): Payer: Self-pay

## 2017-04-21 ENCOUNTER — Ambulatory Visit (INDEPENDENT_AMBULATORY_CARE_PROVIDER_SITE_OTHER): Payer: 59 | Admitting: Family Medicine

## 2017-04-21 ENCOUNTER — Encounter (INDEPENDENT_AMBULATORY_CARE_PROVIDER_SITE_OTHER): Payer: Self-pay | Admitting: Family Medicine

## 2017-04-21 VITALS — BP 132/85 | HR 75 | Temp 98.3°F | Ht 62.0 in | Wt 256.0 lb

## 2017-04-21 DIAGNOSIS — R5383 Other fatigue: Secondary | ICD-10-CM

## 2017-04-21 DIAGNOSIS — R0602 Shortness of breath: Secondary | ICD-10-CM | POA: Diagnosis not present

## 2017-04-21 DIAGNOSIS — K760 Fatty (change of) liver, not elsewhere classified: Secondary | ICD-10-CM

## 2017-04-21 DIAGNOSIS — Z6841 Body Mass Index (BMI) 40.0 and over, adult: Secondary | ICD-10-CM

## 2017-04-21 DIAGNOSIS — Z9189 Other specified personal risk factors, not elsewhere classified: Secondary | ICD-10-CM

## 2017-04-21 DIAGNOSIS — Z1331 Encounter for screening for depression: Secondary | ICD-10-CM | POA: Diagnosis not present

## 2017-04-21 DIAGNOSIS — Z0289 Encounter for other administrative examinations: Secondary | ICD-10-CM
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
## 2017-04-22 ENCOUNTER — Other Ambulatory Visit (INDEPENDENT_AMBULATORY_CARE_PROVIDER_SITE_OTHER): Payer: Self-pay | Admitting: Family Medicine

## 2017-04-22 DIAGNOSIS — R5383 Other fatigue: Secondary | ICD-10-CM | POA: Diagnosis not present

## 2017-04-22 DIAGNOSIS — K760 Fatty (change of) liver, not elsewhere classified: Secondary | ICD-10-CM | POA: Diagnosis not present

## 2017-04-23 LAB — COMPREHENSIVE METABOLIC PANEL
ALT: 22 IU/L (ref 0–32)
AST: 15 IU/L (ref 0–40)
Albumin/Globulin Ratio: 1.6 (ref 1.2–2.2)
Albumin: 4.2 g/dL (ref 3.5–5.5)
Alkaline Phosphatase: 54 IU/L (ref 39–117)
BUN/Creatinine Ratio: 19 (ref 9–23)
BUN: 11 mg/dL (ref 6–20)
Bilirubin Total: 0.2 mg/dL (ref 0.0–1.2)
CALCIUM: 9.3 mg/dL (ref 8.7–10.2)
CO2: 17 mmol/L — ABNORMAL LOW (ref 20–29)
Chloride: 106 mmol/L (ref 96–106)
Creatinine, Ser: 0.59 mg/dL (ref 0.57–1.00)
GFR, EST AFRICAN AMERICAN: 140 mL/min/{1.73_m2} (ref >59)
GFR, EST NON AFRICAN AMERICAN: 122 mL/min/{1.73_m2} (ref >59)
GLOBULIN, TOTAL: 2.7 g/dL (ref 1.5–4.5)
Glucose: 92 mg/dL (ref 65–99)
POTASSIUM: 4.5 mmol/L (ref 3.5–5.2)
SODIUM: 140 mmol/L (ref 134–144)
TOTAL PROTEIN: 6.9 g/dL (ref 6.0–8.5)

## 2017-04-23 LAB — CBC WITH DIFFERENTIAL
BASOS ABS: 0 10*3/uL (ref 0.0–0.2)
Basos: 0 %
EOS (ABSOLUTE): 0.1 10*3/uL (ref 0.0–0.4)
EOS: 2 %
HEMOGLOBIN: 15.4 g/dL (ref 11.1–15.9)
Hematocrit: 45.1 % (ref 34.0–46.6)
IMMATURE GRANS (ABS): 0 10*3/uL (ref 0.0–0.1)
Immature Granulocytes: 1 %
LYMPHS: 25 %
Lymphocytes Absolute: 2.2 10*3/uL (ref 0.7–3.1)
MCH: 31.6 pg (ref 26.6–33.0)
MCHC: 34.1 g/dL (ref 31.5–35.7)
MCV: 93 fL (ref 79–97)
Monocytes Absolute: 0.3 10*3/uL (ref 0.1–0.9)
Monocytes: 4 %
NEUTROS ABS: 5.9 10*3/uL (ref 1.4–7.0)
Neutrophils: 68 %
RBC: 4.87 x10E6/uL (ref 3.77–5.28)
RDW: 13.6 % (ref 12.3–15.4)
WBC: 8.6 10*3/uL (ref 3.4–10.8)

## 2017-04-23 LAB — HEMOGLOBIN A1C
Est. average glucose Bld gHb Est-mCnc: 105 mg/dL
HEMOGLOBIN A1C: 5.3 % (ref 4.8–5.6)

## 2017-04-23 LAB — LIPID PANEL WITH LDL/HDL RATIO
Cholesterol, Total: 185 mg/dL (ref 100–199)
HDL: 42 mg/dL (ref >39)
LDL Calculated: 126 mg/dL — ABNORMAL HIGH (ref 0–99)
LDl/HDL Ratio: 3 ratio (ref 0.0–3.2)
Triglycerides: 85 mg/dL (ref 0–149)
VLDL CHOLESTEROL CAL: 17 mg/dL (ref 5–40)

## 2017-04-23 LAB — VITAMIN D 25 HYDROXY (VIT D DEFICIENCY, FRACTURES): VIT D 25 HYDROXY: 16.5 ng/mL — AB (ref 30.0–100.0)

## 2017-04-23 LAB — VITAMIN B12: Vitamin B-12: 254 pg/mL (ref 232–1245)

## 2017-04-23 LAB — T3: T3 TOTAL: 149 ng/dL (ref 71–180)

## 2017-04-23 LAB — INSULIN, RANDOM: INSULIN: 32.6 u[IU]/mL — ABNORMAL HIGH (ref 2.6–24.9)

## 2017-04-23 LAB — FOLATE: FOLATE: 9.1 ng/mL (ref >3.0)

## 2017-04-23 LAB — TSH: TSH: 0.878 u[IU]/mL (ref 0.450–4.500)

## 2017-04-23 LAB — T4, FREE: Free T4: 1.17 ng/dL (ref 0.82–1.77)

## 2017-05-04 ENCOUNTER — Ambulatory Visit (INDEPENDENT_AMBULATORY_CARE_PROVIDER_SITE_OTHER): Payer: 59 | Admitting: Family Medicine

## 2017-05-06 ENCOUNTER — Ambulatory Visit (INDEPENDENT_AMBULATORY_CARE_PROVIDER_SITE_OTHER): Payer: 59 | Admitting: Family Medicine

## 2017-05-06 VITALS — BP 131/87 | HR 85 | Temp 98.7°F | Ht 62.0 in | Wt 249.0 lb

## 2017-05-06 DIAGNOSIS — E8881 Metabolic syndrome: Secondary | ICD-10-CM | POA: Diagnosis not present

## 2017-05-06 DIAGNOSIS — Z6841 Body Mass Index (BMI) 40.0 and over, adult: Secondary | ICD-10-CM

## 2017-05-06 DIAGNOSIS — Z9189 Other specified personal risk factors, not elsewhere classified: Secondary | ICD-10-CM

## 2017-05-06 DIAGNOSIS — E559 Vitamin D deficiency, unspecified: Secondary | ICD-10-CM

## 2017-05-06 MED ORDER — METFORMIN HCL 500 MG PO TABS: 500.0000 mg | ORAL_TABLET | Freq: Every day | ORAL | 0 refills | Status: DC

## 2017-05-06 MED ORDER — VITAMIN D (ERGOCALCIFEROL) 1.25 MG (50000 UNIT) PO CAPS: 50000.0000 [IU] | ORAL_CAPSULE | ORAL | 0 refills | Status: DC

## 2017-05-06 MED FILL — metFORMIN HCL 500 MG TABS: 500 | 30 days supply | Qty: 30 | Fill #0

## 2017-05-06 MED FILL — VIT D2 1.25 MG (50,000 UNIT: 1.25 MG | 28 days supply | Qty: 4 | Fill #0

## 2017-05-06 MED FILL — SETLAKIN 0.15 MG-0.03 MG TA: 0.15-0.03 | 91 days supply | Qty: 91 | Fill #0
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
## 2017-05-07 ENCOUNTER — Encounter (INDEPENDENT_AMBULATORY_CARE_PROVIDER_SITE_OTHER): Payer: Self-pay | Admitting: Family Medicine

## 2017-05-11 ENCOUNTER — Encounter (INDEPENDENT_AMBULATORY_CARE_PROVIDER_SITE_OTHER): Payer: Self-pay | Admitting: Family Medicine

## 2017-05-13 ENCOUNTER — Encounter (INDEPENDENT_AMBULATORY_CARE_PROVIDER_SITE_OTHER): Payer: Self-pay | Admitting: Family Medicine

## 2017-05-25 ENCOUNTER — Ambulatory Visit (INDEPENDENT_AMBULATORY_CARE_PROVIDER_SITE_OTHER): Payer: 59 | Admitting: Family Medicine

## 2017-05-25 VITALS — BP 134/87 | HR 82 | Temp 98.5°F | Ht 62.0 in | Wt 246.0 lb

## 2017-05-25 DIAGNOSIS — Z6841 Body Mass Index (BMI) 40.0 and over, adult: Secondary | ICD-10-CM | POA: Diagnosis not present

## 2017-05-25 DIAGNOSIS — E559 Vitamin D deficiency, unspecified: Secondary | ICD-10-CM

## 2017-05-25 DIAGNOSIS — Z9189 Other specified personal risk factors, not elsewhere classified: Secondary | ICD-10-CM

## 2017-05-25 DIAGNOSIS — E8881 Metabolic syndrome: Secondary | ICD-10-CM

## 2017-05-25 MED ORDER — VITAMIN D (ERGOCALCIFEROL) 1.25 MG (50000 UNIT) PO CAPS: 50000.0000 [IU] | ORAL_CAPSULE | ORAL | 0 refills | Status: DC

## 2017-05-25 MED ORDER — METFORMIN HCL 500 MG PO TABS: 500.0000 mg | ORAL_TABLET | Freq: Every day | ORAL | 0 refills | Status: DC
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
## 2017-05-27 ENCOUNTER — Encounter (INDEPENDENT_AMBULATORY_CARE_PROVIDER_SITE_OTHER): Payer: Self-pay | Admitting: Family Medicine

## 2017-05-28 MED FILL — VIT D2 1.25 MG (50,000 UNIT: 1.25 MG | 28 days supply | Qty: 4 | Fill #0

## 2017-05-29 ENCOUNTER — Encounter (INDEPENDENT_AMBULATORY_CARE_PROVIDER_SITE_OTHER): Payer: Self-pay | Admitting: Family Medicine

## 2017-06-01 MED FILL — metFORMIN HCL 500 MG TABS: 500 | 30 days supply | Qty: 30 | Fill #0

## 2017-06-09 ENCOUNTER — Ambulatory Visit (INDEPENDENT_AMBULATORY_CARE_PROVIDER_SITE_OTHER): Payer: 59 | Admitting: Dietician

## 2017-06-09 VITALS — Ht 62.0 in | Wt 245.0 lb

## 2017-06-09 DIAGNOSIS — E8881 Metabolic syndrome: Secondary | ICD-10-CM | POA: Diagnosis not present

## 2017-06-09 DIAGNOSIS — Z9189 Other specified personal risk factors, not elsewhere classified: Secondary | ICD-10-CM

## 2017-06-09 DIAGNOSIS — Z6841 Body Mass Index (BMI) 40.0 and over, adult: Secondary | ICD-10-CM

## 2017-06-15 NOTE — Progress Notes (Signed)
  Office: [phone]  /  Fax: [phone]     Alisha Wood has a diagnosis of insulin resistance based on her elevated fasting insulin level >5. Although Alisha Wood's blood glucose readings are still under good control, insulin resistance puts her at greater risk of metabolic syndrome and diabetes.  Alisha Wood is here today for diabetic risk nutrition counseling.  Her weight today is 245 lbs a 1 lb weight loss since her last visit. She has had a weight loss of 11 lbs since beginning treatment with [place]. Alisha Wood is on our category 2 meal plan and reports following approximately 50% of the time. She admits struggling following the meal plan over the past 2 weeks getting bored and wanting more variety. Alisha Wood agreed to try journaling her food intake using the application My Fitness Pal with nutrition goals of 1200-1300 calories and 80 + grams of protein. She was given extensive education on journaling as well as  food nutrients ie protein, fats, simple and complex carbohydrates and how these affect insulin response. Focus on portion control,  avoiding simple carbohydrates and lower fat foods for ongoing wt loss efforts. Eating healthy through the Thanksgiving holiday also discussed in detail.   Alisha Wood is on the following meal plan: journaling 1200-1300 calories and 80+ grams of protein.  Her meal plan was individualized for maximum benefit.  Also discussed at length the following behavioral modifications to help maximize success: increasing lean protein intake,  meal planning and cooking strategies, Holiday eating strategies,  keeping a strict food journal.     Alisha Wood has been instructed to work up to a goal of 150 minutes of combined cardio and strengthening exercise per week for weight loss and overall health benefits. Written information was provided and the following handouts were given: Journaling instructions and healthy eating for the holiday.

## 2017-06-16 DIAGNOSIS — Z1151 Encounter for screening for human papillomavirus (HPV): Secondary | ICD-10-CM | POA: Diagnosis not present

## 2017-06-16 DIAGNOSIS — Z6841 Body Mass Index (BMI) 40.0 and over, adult: Secondary | ICD-10-CM | POA: Diagnosis not present

## 2017-06-16 DIAGNOSIS — Z01419 Encounter for gynecological examination (general) (routine) without abnormal findings: Secondary | ICD-10-CM | POA: Diagnosis not present

## 2017-06-16 LAB — HM PAP SMEAR

## 2017-06-22 ENCOUNTER — Ambulatory Visit (INDEPENDENT_AMBULATORY_CARE_PROVIDER_SITE_OTHER): Payer: 59 | Admitting: Family Medicine

## 2017-06-22 VITALS — BP 137/80 | HR 74 | Temp 98.2°F | Ht 62.0 in | Wt 240.0 lb

## 2017-06-22 DIAGNOSIS — E8881 Metabolic syndrome: Secondary | ICD-10-CM | POA: Diagnosis not present

## 2017-06-22 DIAGNOSIS — Z9189 Other specified personal risk factors, not elsewhere classified: Secondary | ICD-10-CM | POA: Diagnosis not present

## 2017-06-22 DIAGNOSIS — E559 Vitamin D deficiency, unspecified: Secondary | ICD-10-CM

## 2017-06-22 DIAGNOSIS — Z6841 Body Mass Index (BMI) 40.0 and over, adult: Secondary | ICD-10-CM | POA: Diagnosis not present

## 2017-06-22 MED ORDER — METFORMIN HCL 500 MG PO TABS: 500.0000 mg | ORAL_TABLET | Freq: Every day | ORAL | 0 refills | Status: DC

## 2017-06-22 MED ORDER — VITAMIN D (ERGOCALCIFEROL) 1.25 MG (50000 UNIT) PO CAPS: 50000.0000 [IU] | ORAL_CAPSULE | ORAL | 0 refills | Status: DC
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
## 2017-07-06 ENCOUNTER — Ambulatory Visit (INDEPENDENT_AMBULATORY_CARE_PROVIDER_SITE_OTHER): Payer: 59 | Admitting: Family Medicine

## 2017-07-06 VITALS — BP 122/84 | HR 78 | Temp 98.6°F | Ht 62.0 in | Wt 237.0 lb

## 2017-07-06 DIAGNOSIS — E559 Vitamin D deficiency, unspecified: Secondary | ICD-10-CM

## 2017-07-06 DIAGNOSIS — Z6841 Body Mass Index (BMI) 40.0 and over, adult: Secondary | ICD-10-CM | POA: Diagnosis not present

## 2017-07-06 MED FILL — metFORMIN HCL 500 MG TABS: 500 | 30 days supply | Qty: 30 | Fill #0

## 2017-07-06 MED FILL — VIT D2 1.25 MG (50,000 UNIT: 1.25 MG | 28 days supply | Qty: 4 | Fill #0
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
## 2017-07-27 ENCOUNTER — Ambulatory Visit (INDEPENDENT_AMBULATORY_CARE_PROVIDER_SITE_OTHER): Payer: 59 | Admitting: Family Medicine

## 2017-07-27 VITALS — BP 128/83 | HR 80 | Temp 98.9°F | Ht 62.0 in | Wt 236.0 lb

## 2017-07-27 DIAGNOSIS — E8881 Metabolic syndrome: Secondary | ICD-10-CM

## 2017-07-27 DIAGNOSIS — Z6841 Body Mass Index (BMI) 40.0 and over, adult: Secondary | ICD-10-CM | POA: Diagnosis not present

## 2017-07-27 DIAGNOSIS — Z9189 Other specified personal risk factors, not elsewhere classified: Secondary | ICD-10-CM | POA: Diagnosis not present

## 2017-07-27 DIAGNOSIS — E559 Vitamin D deficiency, unspecified: Secondary | ICD-10-CM

## 2017-07-27 MED ORDER — METFORMIN HCL 500 MG PO TABS: 500.0000 mg | ORAL_TABLET | Freq: Every day | ORAL | 0 refills | Status: DC

## 2017-07-27 MED ORDER — VITAMIN D (ERGOCALCIFEROL) 1.25 MG (50000 UNIT) PO CAPS: 50000.0000 [IU] | ORAL_CAPSULE | ORAL | 0 refills | Status: DC
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
## 2017-08-06 MED FILL — SETLAKIN 0.15 MG-0.03 MG TA: 0.15-0.03 | 91 days supply | Qty: 91 | Fill #0

## 2017-08-06 MED FILL — metFORMIN HCL 500 MG TABS: 500 | 30 days supply | Qty: 30 | Fill #0

## 2017-08-06 MED FILL — VIT D2 1.25 MG (50,000 UNIT: 1.25 MG | 28 days supply | Qty: 4 | Fill #0

## 2017-08-12 ENCOUNTER — Encounter (INDEPENDENT_AMBULATORY_CARE_PROVIDER_SITE_OTHER): Payer: Self-pay | Admitting: Family Medicine

## 2017-08-12 ENCOUNTER — Ambulatory Visit (INDEPENDENT_AMBULATORY_CARE_PROVIDER_SITE_OTHER): Payer: 59 | Admitting: Family Medicine

## 2017-08-12 ENCOUNTER — Encounter (INDEPENDENT_AMBULATORY_CARE_PROVIDER_SITE_OTHER): Payer: Self-pay

## 2017-08-17 ENCOUNTER — Ambulatory Visit (INDEPENDENT_AMBULATORY_CARE_PROVIDER_SITE_OTHER): Payer: 59 | Admitting: Physician Assistant

## 2017-08-17 ENCOUNTER — Encounter (INDEPENDENT_AMBULATORY_CARE_PROVIDER_SITE_OTHER): Payer: Self-pay | Admitting: Family Medicine

## 2017-08-17 VITALS — BP 137/87 | HR 78 | Temp 98.1°F | Ht 62.0 in | Wt 233.0 lb

## 2017-08-17 DIAGNOSIS — Z6841 Body Mass Index (BMI) 40.0 and over, adult: Secondary | ICD-10-CM

## 2017-08-17 DIAGNOSIS — Z9189 Other specified personal risk factors, not elsewhere classified: Secondary | ICD-10-CM

## 2017-08-17 DIAGNOSIS — E7849 Other hyperlipidemia: Secondary | ICD-10-CM | POA: Diagnosis not present

## 2017-08-17 DIAGNOSIS — E8881 Metabolic syndrome: Secondary | ICD-10-CM | POA: Diagnosis not present

## 2017-08-17 DIAGNOSIS — E785 Hyperlipidemia, unspecified: Secondary | ICD-10-CM | POA: Insufficient documentation

## 2017-08-17 DIAGNOSIS — E559 Vitamin D deficiency, unspecified: Secondary | ICD-10-CM | POA: Diagnosis not present
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
## 2017-08-18 LAB — LIPID PANEL WITH LDL/HDL RATIO
CHOLESTEROL TOTAL: 176 mg/dL (ref 100–199)
HDL: 43 mg/dL (ref >39)
LDL Calculated: 118 mg/dL — ABNORMAL HIGH (ref 0–99)
LDl/HDL Ratio: 2.7 ratio (ref 0.0–3.2)
TRIGLYCERIDES: 73 mg/dL (ref 0–149)
VLDL CHOLESTEROL CAL: 15 mg/dL (ref 5–40)

## 2017-08-18 LAB — COMPREHENSIVE METABOLIC PANEL
ALBUMIN: 4.4 g/dL (ref 3.5–5.5)
ALT: 36 IU/L — AB (ref 0–32)
AST: 23 IU/L (ref 0–40)
Albumin/Globulin Ratio: 1.6 (ref 1.2–2.2)
Alkaline Phosphatase: 64 IU/L (ref 39–117)
BILIRUBIN TOTAL: 0.7 mg/dL (ref 0.0–1.2)
BUN/Creatinine Ratio: 24 — ABNORMAL HIGH (ref 9–23)
BUN: 19 mg/dL (ref 6–20)
CHLORIDE: 105 mmol/L (ref 96–106)
CO2: 22 mmol/L (ref 20–29)
CREATININE: 0.78 mg/dL (ref 0.57–1.00)
Calcium: 9.6 mg/dL (ref 8.7–10.2)
GFR calc non Af Amer: 101 mL/min/{1.73_m2} (ref >59)
GFR, EST AFRICAN AMERICAN: 116 mL/min/{1.73_m2} (ref >59)
GLUCOSE: 89 mg/dL (ref 65–99)
Globulin, Total: 2.8 g/dL (ref 1.5–4.5)
Potassium: 4.3 mmol/L (ref 3.5–5.2)
Sodium: 142 mmol/L (ref 134–144)
TOTAL PROTEIN: 7.2 g/dL (ref 6.0–8.5)

## 2017-08-18 LAB — HEMOGLOBIN A1C
Est. average glucose Bld gHb Est-mCnc: 105 mg/dL
Hgb A1c MFr Bld: 5.3 % (ref 4.8–5.6)

## 2017-08-18 LAB — INSULIN, RANDOM: INSULIN: 17.6 u[IU]/mL (ref 2.6–24.9)

## 2017-08-18 LAB — VITAMIN D 25 HYDROXY (VIT D DEFICIENCY, FRACTURES): Vit D, 25-Hydroxy: 39.5 ng/mL (ref 30.0–100.0)

## 2017-08-19 ENCOUNTER — Ambulatory Visit (INDEPENDENT_AMBULATORY_CARE_PROVIDER_SITE_OTHER): Payer: 59 | Admitting: Physician Assistant

## 2017-09-01 ENCOUNTER — Ambulatory Visit (INDEPENDENT_AMBULATORY_CARE_PROVIDER_SITE_OTHER): Payer: 59 | Admitting: Physician Assistant

## 2017-09-01 VITALS — BP 126/84 | HR 90 | Temp 100.1°F | Ht 62.0 in | Wt 233.0 lb

## 2017-09-01 DIAGNOSIS — E8881 Metabolic syndrome: Secondary | ICD-10-CM

## 2017-09-01 DIAGNOSIS — E559 Vitamin D deficiency, unspecified: Secondary | ICD-10-CM | POA: Diagnosis not present

## 2017-09-01 DIAGNOSIS — Z9189 Other specified personal risk factors, not elsewhere classified: Secondary | ICD-10-CM

## 2017-09-01 DIAGNOSIS — E88819 Insulin resistance, unspecified: Secondary | ICD-10-CM

## 2017-09-01 DIAGNOSIS — Z6841 Body Mass Index (BMI) 40.0 and over, adult: Secondary | ICD-10-CM

## 2017-09-01 MED ORDER — VITAMIN D (ERGOCALCIFEROL) 1.25 MG (50000 UNIT) PO CAPS: 50000.0000 [IU] | ORAL_CAPSULE | ORAL | 0 refills | Status: DC
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
## 2017-09-07 ENCOUNTER — Other Ambulatory Visit (INDEPENDENT_AMBULATORY_CARE_PROVIDER_SITE_OTHER): Payer: Self-pay

## 2017-09-07 ENCOUNTER — Encounter (INDEPENDENT_AMBULATORY_CARE_PROVIDER_SITE_OTHER): Payer: Self-pay | Admitting: Physician Assistant

## 2017-09-07 DIAGNOSIS — E559 Vitamin D deficiency, unspecified: Secondary | ICD-10-CM

## 2017-09-07 MED ORDER — VITAMIN D (ERGOCALCIFEROL) 1.25 MG (50000 UNIT) PO CAPS: 50000.0000 [IU] | ORAL_CAPSULE | ORAL | 0 refills | Status: DC

## 2017-09-07 MED FILL — VIT D2 1.25 MG (50,000 UNIT: 1.25 MG | 28 days supply | Qty: 4 | Fill #0

## 2017-09-15 ENCOUNTER — Ambulatory Visit (INDEPENDENT_AMBULATORY_CARE_PROVIDER_SITE_OTHER): Payer: 59 | Admitting: Physician Assistant

## 2017-09-15 VITALS — BP 115/78 | HR 79 | Temp 98.6°F | Ht 62.0 in | Wt 233.0 lb

## 2017-09-15 DIAGNOSIS — E8881 Metabolic syndrome: Secondary | ICD-10-CM | POA: Diagnosis not present

## 2017-09-15 DIAGNOSIS — Z6841 Body Mass Index (BMI) 40.0 and over, adult: Secondary | ICD-10-CM | POA: Diagnosis not present
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
## 2017-09-26 ENCOUNTER — Telehealth: Payer: 59 | Admitting: Family

## 2017-09-26 DIAGNOSIS — N390 Urinary tract infection, site not specified: Secondary | ICD-10-CM | POA: Diagnosis not present

## 2017-09-26 DIAGNOSIS — R319 Hematuria, unspecified: Secondary | ICD-10-CM | POA: Diagnosis not present

## 2017-09-26 MED ORDER — CEPHALEXIN 500 MG PO CAPS: 500.0000 mg | ORAL_CAPSULE | Freq: Two times a day (BID) | ORAL | 0 refills | Status: DC

## 2017-09-26 NOTE — Progress Notes (Signed)

## 2017-09-30 ENCOUNTER — Ambulatory Visit (INDEPENDENT_AMBULATORY_CARE_PROVIDER_SITE_OTHER): Payer: 59 | Admitting: Physician Assistant

## 2017-09-30 ENCOUNTER — Encounter (INDEPENDENT_AMBULATORY_CARE_PROVIDER_SITE_OTHER): Payer: Self-pay | Admitting: Physician Assistant

## 2017-09-30 VITALS — BP 132/88 | HR 94 | Temp 98.5°F | Ht 62.0 in | Wt 232.0 lb

## 2017-09-30 DIAGNOSIS — Z9189 Other specified personal risk factors, not elsewhere classified: Secondary | ICD-10-CM

## 2017-09-30 DIAGNOSIS — E8881 Metabolic syndrome: Secondary | ICD-10-CM | POA: Diagnosis not present

## 2017-09-30 DIAGNOSIS — E559 Vitamin D deficiency, unspecified: Secondary | ICD-10-CM

## 2017-09-30 DIAGNOSIS — Z6841 Body Mass Index (BMI) 40.0 and over, adult: Secondary | ICD-10-CM

## 2017-09-30 MED ORDER — VITAMIN D (ERGOCALCIFEROL) 1.25 MG (50000 UNIT) PO CAPS
50000.0000 [IU] | ORAL_CAPSULE | ORAL | 0 refills | Status: DC
Start: 2017-09-30 — End: 2018-01-13
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
## 2017-10-27 DIAGNOSIS — H5203 Hypermetropia, bilateral: Secondary | ICD-10-CM | POA: Diagnosis not present

## 2017-10-27 DIAGNOSIS — H52222 Regular astigmatism, left eye: Secondary | ICD-10-CM | POA: Diagnosis not present

## 2017-10-29 ENCOUNTER — Ambulatory Visit (INDEPENDENT_AMBULATORY_CARE_PROVIDER_SITE_OTHER): Payer: 59 | Admitting: Physician Assistant

## 2017-11-11 MED FILL — SETLAKIN 0.15 MG-0.03 MG TA: 0.15-0.03 | 91 days supply | Qty: 91 | Fill #1

## 2018-01-13 ENCOUNTER — Encounter: Payer: Self-pay | Admitting: Family Medicine

## 2018-01-13 ENCOUNTER — Ambulatory Visit (INDEPENDENT_AMBULATORY_CARE_PROVIDER_SITE_OTHER): Payer: 59 | Admitting: Family Medicine

## 2018-01-13 VITALS — BP 124/68 | HR 85 | Temp 98.5°F | Ht 62.0 in | Wt 246.2 lb

## 2018-01-13 DIAGNOSIS — E538 Deficiency of other specified B group vitamins: Secondary | ICD-10-CM | POA: Diagnosis not present

## 2018-01-13 DIAGNOSIS — Z0000 Encounter for general adult medical examination without abnormal findings: Secondary | ICD-10-CM

## 2018-01-13 DIAGNOSIS — T61781A Other shellfish poisoning, accidental (unintentional), initial encounter: Secondary | ICD-10-CM

## 2018-01-13 DIAGNOSIS — K760 Fatty (change of) liver, not elsewhere classified: Secondary | ICD-10-CM | POA: Diagnosis not present

## 2018-01-13 DIAGNOSIS — E7849 Other hyperlipidemia: Secondary | ICD-10-CM | POA: Diagnosis not present

## 2018-01-13 DIAGNOSIS — T6191XA Toxic effect of unspecified seafood, accidental (unintentional), initial encounter: Secondary | ICD-10-CM

## 2018-01-13 DIAGNOSIS — E8881 Metabolic syndrome: Secondary | ICD-10-CM | POA: Diagnosis not present

## 2018-01-13 LAB — LIPID PANEL
CHOLESTEROL: 192 mg/dL (ref 0–200)
HDL: 43.5 mg/dL (ref >39.00)
LDL Cholesterol: 136 mg/dL — ABNORMAL HIGH (ref 0–99)
NONHDL: 148.92
Total CHOL/HDL Ratio: 4
Triglycerides: 66 mg/dL (ref 0.0–149.0)
VLDL: 13.2 mg/dL (ref 0.0–40.0)

## 2018-01-13 LAB — VITAMIN B12: Vitamin B-12: 241 pg/mL (ref 211–911)

## 2018-01-13 LAB — COMPREHENSIVE METABOLIC PANEL
ALBUMIN: 4.3 g/dL (ref 3.5–5.2)
ALK PHOS: 43 U/L (ref 39–117)
ALT: 24 U/L (ref 0–35)
AST: 20 U/L (ref 0–37)
BUN: 18 mg/dL (ref 6–23)
CHLORIDE: 108 meq/L (ref 96–112)
CO2: 23 mEq/L (ref 19–32)
CREATININE: 0.61 mg/dL (ref 0.40–1.20)
Calcium: 9.2 mg/dL (ref 8.4–10.5)
GFR: 120.15 mL/min (ref >60.00)
GLUCOSE: 83 mg/dL (ref 70–99)
POTASSIUM: 3.9 meq/L (ref 3.5–5.1)
SODIUM: 138 meq/L (ref 135–145)
TOTAL PROTEIN: 7.5 g/dL (ref 6.0–8.3)
Total Bilirubin: 0.6 mg/dL (ref 0.2–1.2)

## 2018-01-13 NOTE — Progress Notes (Signed)
BP 124/68 (BP Location: Left Arm, Patient Position: Sitting, Cuff Size: Large)   Pulse 85   Temp 98.5 F (36.9 C) (Oral)   Ht _0  (1.575 m)   Wt 246 lb 4 oz (111.7 kg)   LMP 11/13/2017   SpO2 97%   BMI 45.04 kg/m    CC: CPE Subjective:    Patient ID: Alisha Wood, female    DOB: 1985/10/02, 32 y.o.   MRN: [id number]  HPI: Alisha Wood is a 32 y.o. female presenting on 01/13/2018 for Annual Exam   Saw weight management clinic for weight management. Was told she could not bring her child to appointments "too disruptive" so she had to stop going (09/2017). She was taking metformin for increasing insulin resistance.   Has recently noted raw sore throat after eating fish (salmon, tilapia, flounder). Has also recently noted some throat hardening/swelling after eating shrimp. Never short winded, hives, fevers, abd pain  Preventative: Well woman exam - at [facility] with normal pap 03/2017 Flu shot yearly Tdap 2016 MMR completed (2 vaccines) Seat belt use discussed Sunscreen use discussed. No changing moles on skin. 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 Interim medical history since our last visit reviewed. Allergies and medications reviewed and updated. Outpatient Medications Prior to Visit  Medication Sig Dispense Refill  . levonorgestrel-ethinyl estradiol (SEASONALE,INTROVALE,JOLESSA) 0.15-0.03 MG tablet Take 1 tablet by mouth daily.    . cephALEXin (KEFLEX) 500 MG capsule Take 1 capsule (500 mg total) by mouth 2 (two) times daily. 14 capsule 0  . Vitamin D,  Ergocalciferol, (DRISDOL) 50000 units CAPS capsule Take 1 capsule (50,000 Units total) by mouth every 7 (seven) days. 4 capsule 0   No facility-administered medications prior to visit.      Per HPI unless specifically indicated in ROS section below Review of Systems  Constitutional: Negative for activity change, appetite change, chills, fatigue, fever and unexpected weight change.  HENT: Negative for hearing loss.   Eyes: Negative for visual disturbance.  Respiratory: Negative for cough, chest tightness, shortness of breath and wheezing.   Cardiovascular: Negative for chest pain, palpitations and leg swelling.  Gastrointestinal: Negative for abdominal distention, abdominal pain, blood in stool, constipation, diarrhea, nausea and vomiting.  Genitourinary: Negative for difficulty urinating and hematuria.  Musculoskeletal: Negative for arthralgias, myalgias and neck pain.  Skin: Negative for rash.  Neurological: Negative for dizziness, seizures, syncope and headaches.  Hematological: Negative for adenopathy. Does not bruise/bleed easily.  Psychiatric/Behavioral: Negative for dysphoric mood. The patient is not nervous/anxious.        Objective:    BP 124/68 (BP Location: Left Arm, Patient Position: Sitting, Cuff Size: Large)   Pulse 85   Temp 98.5 F (36.9 C) (Oral)   Ht _1  (1.575 m)   Wt 246 lb 4 oz (111.7 kg)   LMP 11/13/2017   SpO2 97%   BMI 45.04 kg/m   Wt Readings from Last 3 Encounters:  01/13/18 246 lb 4 oz (111.7 kg)  09/30/17 232 lb (105.2 kg)  09/15/17 233 lb (105.7 kg)    Physical Exam  Constitutional: She is oriented to person, place, and time. She appears well-developed and well-nourished. No distress.  HENT:  Head: Normocephalic and atraumatic.  Right Ear: Hearing, tympanic membrane, external ear and ear canal normal.  Left Ear: Hearing, tympanic membrane, external ear and ear canal normal.  Nose: Nose normal.  Mouth/Throat: Uvula is midline, oropharynx is  clear and moist and mucous membranes are normal. No oropharyngeal exudate, posterior oropharyngeal edema or posterior oropharyngeal erythema.  Eyes: Pupils are equal, round, and reactive to light. Conjunctivae and EOM are normal. No scleral icterus.  Neck: Normal range of motion. Neck supple. No thyromegaly present.  Cardiovascular: Normal rate, regular rhythm, normal heart sounds and intact distal pulses.  No murmur heard. Pulses:      Radial pulses are 2+ on the right side, and 2+ on the left side.  Pulmonary/Chest: Effort normal and breath sounds normal. No respiratory distress. She has no wheezes. She has no rales.  Abdominal: Soft. Bowel sounds are normal. She exhibits no distension and no mass. There is no tenderness. There is no rebound and no guarding.  Musculoskeletal: Normal range of motion. She exhibits no edema.  Lymphadenopathy:    She has no cervical adenopathy.  Neurological: She is alert and oriented to person, place, and time.  CN grossly intact, station and gait intact  Skin: Skin is warm and dry. No rash noted.  Psychiatric: She has a normal mood and affect. Her behavior is normal. Judgment and thought content normal.  Nursing note and vitals reviewed.  Results for orders placed or performed in visit on 08/17/17  Comprehensive metabolic panel  Result Value Ref Range   Glucose 89 65 - 99 mg/dL   BUN 19 6 - 20 mg/dL   Creatinine, Ser 0.78 0.57 - 1.00 mg/dL   GFR calc non Af Amer 101 >59 mL/min/1.73   GFR calc Af Amer 116 >59 mL/min/1.73   BUN/Creatinine Ratio 24 (H) 9 - 23   Sodium 142 134 - 144 mmol/L   Potassium 4.3 3.5 - 5.2 mmol/L   Chloride 105 96 - 106 mmol/L   CO2 22 20 - 29 mmol/L   Calcium 9.6 8.7 - 10.2 mg/dL   Total Protein 7.2 6.0 - 8.5 g/dL   Albumin 4.4 3.5 - 5.5 g/dL   Globulin, Total 2.8 1.5 - 4.5 g/dL   Albumin/Globulin Ratio 1.6 1.2 - 2.2   Bilirubin Total 0.7 0.0 - 1.2 mg/dL   Alkaline Phosphatase 64 39 - 117 IU/L   AST 23 0 - 40 IU/L   ALT  36 (H) 0 - 32 IU/L  Hemoglobin A1c  Result Value Ref Range   Hgb A1c MFr Bld 5.3 4.8 - 5.6 %   Est. average glucose Bld gHb Est-mCnc 105 mg/dL  Insulin, random  Result Value Ref Range   INSULIN 17.6 2.6 - 24.9 uIU/mL  Lipid Panel With LDL/HDL Ratio  Result Value Ref Range   Cholesterol, Total 176 100 - 199 mg/dL   Triglycerides 73 0 - 149 mg/dL   HDL 43 >39 mg/dL   VLDL Cholesterol Cal 15 5 - 40 mg/dL   LDL Calculated 118 (H) 0 - 99 mg/dL   LDl/HDL Ratio 2.7 0.0 - 3.2 ratio  VITAMIN D 25 Hydroxy (Vit-D Deficiency, Fractures)  Result Value Ref Range   Vit D, 25-Hydroxy 39.5 30.0 - 100.0 ng/mL      Assessment & Plan:   Problem List Items Addressed This Visit    Vitamin B12 deficiency  Low B12 level - update today.       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       Severe obesity (BMI >= 40) (HCC)    No longer followed by [facility]. Has stopped following prior regimen (activity, 1300 cal restricted diet). Motivated to restart this - will RTC 3 months f/u weight.       Other hyperlipidemia    Update FLP.       Relevant Orders   Lipid panel   Comprehensive metabolic panel   NAFLD (nonalcoholic fatty liver disease)    Update CMP.       Insulin resistance    Update glu fasting, insulin level.      Relevant Orders   Insulin, random   Health maintenance examination - Primary    Preventative protocols reviewed and updated unless pt declined. Discussed healthy diet and lifestyle.           No orders of the defined types were placed in this encounter.  Orders Placed This Encounter  Procedures  . Lipid panel  . Comprehensive metabolic panel  . Vitamin B12  . Insulin, random    Follow up plan: Return in about 3 months (around 04/15/2018) for follow up visit.  [name], MD

## 2018-01-13 NOTE — Patient Instructions (Addendum)
Labs today.  Work on regular exercise routine and restart restricted calorie diet as before.  Good to see you today Return in 3 months for weight follow up. Return as needed or in 1 year for next physical.   Health Maintenance, Female Adopting a healthy lifestyle and getting preventive care can go a long way to promote health and wellness. Talk with your health care provider about what schedule of regular examinations is right for you. This is a good chance for you to check in with your provider about disease prevention and staying healthy. In between checkups, there are plenty of things you can do on your own. Experts have done a lot of research about which lifestyle changes and preventive measures are most likely to keep you healthy. Ask your health care provider for more information. Weight and diet Eat a healthy diet  Be sure to include plenty of vegetables, fruits, low-fat dairy products, and lean protein.  Do not eat a lot of foods high in solid fats, added sugars, or salt.  Get regular exercise. This is one of the most important things you can do for your health. ? Most adults should exercise for at least 150 minutes each week. The exercise should increase your heart rate and make you sweat (moderate-intensity exercise). ? Most adults should also do strengthening exercises at least twice a week. This is in addition to the moderate-intensity exercise.  Maintain a healthy weight  Body mass index (BMI) is a measurement that can be used to identify possible weight problems. It estimates body fat based on height and weight. Your health care provider can help determine your BMI and help you achieve or maintain a healthy weight.  For females 36 years of age and older: ? A BMI below 18.5 is considered underweight. ? A BMI of 18.5 to 24.9 is normal. ? A BMI of 25 to 29.9 is considered overweight. ? A BMI of 30 and above is considered obese.  Watch levels of cholesterol and blood  lipids  You should start having your blood tested for lipids and cholesterol at 33 years of age, then have this test every 5 years.  You may need to have your cholesterol levels checked more often if: ? Your lipid or cholesterol levels are high. ? You are older than 33 years of age. ? You are at high risk for heart disease.  Cancer screening Lung Cancer  Lung cancer screening is recommended for adults 53-55 years old who are at high risk for lung cancer because of a history of smoking.  A yearly low-dose CT scan of the lungs is recommended for people who: ? Currently smoke. ? Have quit within the past 15 years. ? Have at least a 30-pack-year history of smoking. A pack year is smoking an average of one pack of cigarettes a day for 1 year.  Yearly screening should continue until it has been 15 years since you quit.  Yearly screening should stop if you develop a health problem that would prevent you from having lung cancer treatment.  Breast Cancer  Practice breast self-awareness. This means understanding how your breasts normally appear and feel.  It also means doing regular breast self-exams. Let your health care provider know about any changes, no matter how small.  If you are in your 20s or 30s, you should have a clinical breast exam (CBE) by a health care provider every 1-3 years as part of a regular health exam.  If you are 40 or  older, have a CBE every year. Also consider having a breast X-ray (mammogram) every year.  If you have a family history of breast cancer, talk to your health care provider about genetic screening.  If you are at high risk for breast cancer, talk to your health care provider about having an MRI and a mammogram every year.  Breast cancer gene (BRCA) assessment is recommended for women who have family members with BRCA-related cancers. BRCA-related cancers include: ? Breast. ? Ovarian. ? Tubal. ? Peritoneal cancers.  Results of the assessment will  determine the need for genetic counseling and BRCA1 and BRCA2 testing.  Cervical Cancer Your health care provider may recommend that you be screened regularly for cancer of the pelvic organs (ovaries, uterus, and vagina). This screening involves a pelvic examination, including checking for microscopic changes to the surface of your cervix (Pap test). You may be encouraged to have this screening done every 3 years, beginning at age 35.  For women ages 82-65, health care providers may recommend pelvic exams and Pap testing every 3 years, or they may recommend the Pap and pelvic exam, combined with testing for human papilloma virus (HPV), every 5 years. Some types of HPV increase your risk of cervical cancer. Testing for HPV may also be done on women of any age with unclear Pap test results.  Other health care providers may not recommend any screening for nonpregnant women who are considered low risk for pelvic cancer and who do not have symptoms. Ask your health care provider if a screening pelvic exam is right for you.  If you have had past treatment for cervical cancer or a condition that could lead to cancer, you need Pap tests and screening for cancer for at least 20 years after your treatment. If Pap tests have been discontinued, your risk factors (such as having a new sexual partner) need to be reassessed to determine if screening should resume. Some women have medical problems that increase the chance of getting cervical cancer. In these cases, your health care provider may recommend more frequent screening and Pap tests.  Colorectal Cancer  This type of cancer can be detected and often prevented.  Routine colorectal cancer screening usually begins at 33 years of age and continues through 33 years of age.  Your health care provider may recommend screening at an earlier age if you have risk factors for colon cancer.  Your health care provider may also recommend using home test kits to check  for hidden blood in the stool.  A small camera at the end of a tube can be used to examine your colon directly (sigmoidoscopy or colonoscopy). This is done to check for the earliest forms of colorectal cancer.  Routine screening usually begins at age 53.  Direct examination of the colon should be repeated every 5-10 years through 33 years of age. However, you may need to be screened more often if early forms of precancerous polyps or small growths are found.  Skin Cancer  Check your skin from head to toe regularly.  Tell your health care provider about any new moles or changes in moles, especially if there is a change in a mole's shape or color.  Also tell your health care provider if you have a mole that is larger than the size of a pencil eraser.  Always use sunscreen. Apply sunscreen liberally and repeatedly throughout the day.  Protect yourself by wearing long sleeves, pants, a wide-brimmed hat, and sunglasses whenever you are outside.  Heart disease, diabetes, and high blood pressure  High blood pressure causes heart disease and increases the risk of stroke. High blood pressure is more likely to develop in: ? People who have blood pressure in the high end of the normal range (130-139/85-89 mm Hg). ? People who are overweight or obese. ? People who are African American.  If you are 3-21 years of age, have your blood pressure checked every 3-5 years. If you are 28 years of age or older, have your blood pressure checked every year. You should have your blood pressure measured twice-once when you are at a hospital or clinic, and once when you are not at a hospital or clinic. Record the average of the two measurements. To check your blood pressure when you are not at a hospital or clinic, you can use: ? An automated blood pressure machine at a pharmacy. ? A home blood pressure monitor.  If you are between 62 years and 77 years old, ask your health care provider if you should take  aspirin to prevent strokes.  Have regular diabetes screenings. This involves taking a blood sample to check your fasting blood sugar level. ? If you are at a normal weight and have a low risk for diabetes, have this test once every three years after 33 years of age. ? If you are overweight and have a high risk for diabetes, consider being tested at a younger age or more often. Preventing infection Hepatitis B  If you have a higher risk for hepatitis B, you should be screened for this virus. You are considered at high risk for hepatitis B if: ? You were born in a country where hepatitis B is common. Ask your health care provider which countries are considered high risk. ? Your parents were born in a high-risk country, and you have not been immunized against hepatitis B (hepatitis B vaccine). ? You have HIV or AIDS. ? You use needles to inject street drugs. ? You live with someone who has hepatitis B. ? You have had sex with someone who has hepatitis B. ? You get hemodialysis treatment. ? You take certain medicines for conditions, including cancer, organ transplantation, and autoimmune conditions.  Hepatitis C  Blood testing is recommended for: ? Everyone born from [birth year] through 1966. ? Anyone with known risk factors for hepatitis C.  Sexually transmitted infections (STIs)  You should be screened for sexually transmitted infections (STIs) including gonorrhea and chlamydia if: ? You are sexually active and are younger than 33 years of age. ? You are older than 33 years of age and your health care provider tells you that you are at risk for this type of infection. ? Your sexual activity has changed since you were last screened and you are at an increased risk for chlamydia or gonorrhea. Ask your health care provider if you are at risk.  If you do not have HIV, but are at risk, it may be recommended that you take a prescription medicine daily to prevent HIV infection. This is called  pre-exposure prophylaxis (PrEP). You are considered at risk if: ? You are sexually active and do not regularly use condoms or know the HIV status of your partner(s). ? You take drugs by injection. ? You are sexually active with a partner who has HIV.  Talk with your health care provider about whether you are at high risk of being infected with HIV. If you choose to begin PrEP, you should first be tested for HIV.  You should then be tested every 3 months for as long as you are taking PrEP. Pregnancy  If you are premenopausal and you may become pregnant, ask your health care provider about preconception counseling.  If you may become pregnant, take 400 to 800 micrograms (mcg) of folic acid every day.  If you want to prevent pregnancy, talk to your health care provider about birth control (contraception). Osteoporosis and menopause  Osteoporosis is a disease in which the bones lose minerals and strength with aging. This can result in serious bone fractures. Your risk for osteoporosis can be identified using a bone density scan.  If you are 65 years of age or older, or if you are at risk for osteoporosis and fractures, ask your health care provider if you should be screened.  Ask your health care provider whether you should take a calcium or vitamin D supplement to lower your risk for osteoporosis.  Menopause may have certain physical symptoms and risks.  Hormone replacement therapy may reduce some of these symptoms and risks. Talk to your health care provider about whether hormone replacement therapy is right for you. Follow these instructions at home:  Schedule regular health, dental, and eye exams.  Stay current with your immunizations.  Do not use any tobacco products including cigarettes, chewing tobacco, or electronic cigarettes.  If you are pregnant, do not drink alcohol.  If you are breastfeeding, limit how much and how often you drink alcohol.  Limit alcohol intake to no more  than 1 drink per day for nonpregnant women. One drink equals 12 ounces of beer, 5 ounces of wine, or 1 ounces of hard liquor.  Do not use street drugs.  Do not share needles.  Ask your health care provider for help if you need support or information about quitting drugs.  Tell your health care provider if you often feel depressed.  Tell your health care provider if you have ever been abused or do not feel safe at home. This information is not intended to replace advice given to you by your health care provider. Make sure you discuss any questions you have with your health care provider. Document Released: 01/20/2011 Document Revised: 12/13/2015 Document Reviewed: 04/10/2015 Elsevier Interactive Patient Education  2018 [employer]  

## 2018-01-13 NOTE — Assessment & Plan Note (Signed)
Low B12 level - update today.

## 2018-01-13 NOTE — Assessment & Plan Note (Signed)
Update CMP.  

## 2018-01-13 NOTE — Assessment & Plan Note (Signed)
Update glu fasting, insulin level.

## 2018-01-13 NOTE — Assessment & Plan Note (Signed)
Update FLP.  

## 2018-01-13 NOTE — Assessment & Plan Note (Signed)
No longer followed by [facility] weight management clinic. Has stopped following prior regimen (activity, 1300 cal restricted diet). Motivated to restart this - will RTC 3 months f/u weight.

## 2018-01-13 NOTE — Assessment & Plan Note (Signed)
Preventative protocols reviewed and updated unless pt declined. Discussed healthy diet and lifestyle.  

## 2018-01-13 NOTE — Assessment & Plan Note (Signed)
?  fish, shellfish allergy - will watch for now.

## 2018-01-14 LAB — INSULIN, RANDOM: INSULIN: 19.2 u[IU]/mL (ref 2.0–19.6)

## 2018-01-17 ENCOUNTER — Other Ambulatory Visit: Payer: Self-pay | Admitting: Family Medicine

## 2018-01-17 MED ORDER — B-12 1000 MCG SL SUBL: 1.0000 | SUBLINGUAL_TABLET | Freq: Every day | SUBLINGUAL | Status: DC

## 2018-01-26 ENCOUNTER — Encounter: Payer: Self-pay | Admitting: Family Medicine

## 2018-01-26 ENCOUNTER — Ambulatory Visit: Payer: 59 | Admitting: Family Medicine

## 2018-01-26 VITALS — BP 118/64 | HR 85 | Temp 98.6°F | Ht 62.0 in | Wt 250.5 lb

## 2018-01-26 DIAGNOSIS — T7840XA Allergy, unspecified, initial encounter: Secondary | ICD-10-CM | POA: Insufficient documentation

## 2018-01-26 DIAGNOSIS — T6191XD Toxic effect of unspecified seafood, accidental (unintentional), subsequent encounter: Secondary | ICD-10-CM | POA: Diagnosis not present

## 2018-01-26 DIAGNOSIS — T61781D Other shellfish poisoning, accidental (unintentional), subsequent encounter: Secondary | ICD-10-CM

## 2018-01-26 DIAGNOSIS — R221 Localized swelling, mass and lump, neck: Secondary | ICD-10-CM

## 2018-01-26 NOTE — Patient Instructions (Signed)
See our referral coordinators for allergist appointment for further evaluation of possible food allergies.

## 2018-01-26 NOTE — Progress Notes (Signed)
BP 118/64 (BP Location: Left Arm, Patient Position: Sitting, Cuff Size: Large)   Pulse 85   Temp 98.6 F (37 C) (Oral)   Ht 5\' 2"  (1.575 m)   Wt 250 lb 8 oz (113.6 kg)   SpO2 97%   BMI 45.82 kg/m    CC: throat swelling Subjective:    Patient ID: Alisha Wood, female    DOB: 1986-02-20, 32 y.o.   MRN: [id number]  HPI: Alisha Wood is a 32 y.o. female presenting on 01/26/2018 for Throat Swelling (Here for f/u of swelling in the throat.)   2 wk h/o throat pain and swelling/tightness - never affected breathing. Never dyspnea. Symptoms come and go. Latest episode happened after eating frozen pepperoni pizza ([employer]).   Treating with benadryl without improvement.   07/2017 - noted raw sore throat after eating fish (salmon, tilapia, flounder) and throat swelling/hardening with shrimp. Hasn't tried any fish or shellfish since this happened.   Never dyspnea, hives, fevers, abd pain, nausea or dizziness or hypotension.   Only taking OCP - for years.  No other vitamins, supplements, OTC remedies.   Relevant past medical, surgical, family and social history reviewed and updated as indicated. Interim medical history since our last visit reviewed. Allergies and medications reviewed and updated. Outpatient Medications Prior to Visit  Medication Sig Dispense Refill  . levonorgestrel-ethinyl estradiol (SEASONALE,INTROVALE,JOLESSA) 0.15-0.03 MG tablet Take 1 tablet by mouth daily.    . Cyanocobalamin (B-12) 1000 MCG SUBL Place 1 tablet under the tongue daily. (Patient not taking: Reported on 01/26/2018) 30 each    No facility-administered medications prior to visit.      Per HPI unless specifically indicated in ROS section below Review of Systems     Objective:    BP 118/64 (BP Location: Left Arm, Patient Position: Sitting, Cuff Size: Large)   Pulse 85   Temp 98.6 F (37 C) (Oral)   Ht 5\' 2"  (1.575 m)   Wt 250 lb 8 oz (113.6 kg)   SpO2 97%   BMI 45.82 kg/m   Wt Readings  from Last 3 Encounters:  01/26/18 250 lb 8 oz (113.6 kg)  01/13/18 246 lb 4 oz (111.7 kg)  09/30/17 232 lb (105.2 kg)    Physical Exam  Constitutional: She appears well-developed and well-nourished. No distress.  HENT:  Head: Normocephalic and atraumatic.  Mouth/Throat: Uvula is midline, oropharynx is clear and moist and mucous membranes are normal. No oropharyngeal exudate, posterior oropharyngeal edema, posterior oropharyngeal erythema or tonsillar abscesses.  Eyes: Pupils are equal, round, and reactive to light. Conjunctivae and EOM are normal.  Neck: Normal range of motion. Neck supple. No tracheal deviation present. No thyromegaly present.  Lymphadenopathy:    She has no cervical adenopathy.  Skin: Skin is warm and dry. No rash noted.  Nursing note and vitals reviewed.  Results for orders placed or performed in visit on 01/13/18  Lipid panel  Result Value Ref Range   Cholesterol 192 0 - 200 mg/dL   Triglycerides 66.0 0.0 - 149.0 mg/dL   HDL 43.50 >39.00 mg/dL   VLDL 13.2 0.0 - 40.0 mg/dL   LDL Cholesterol 136 (H) 0 - 99 mg/dL   Total CHOL/HDL Ratio 4    NonHDL 148.92   Comprehensive metabolic panel  Result Value Ref Range   Sodium 138 135 - 145 mEq/L   Potassium 3.9 3.5 - 5.1 mEq/L   Chloride 108 96 - 112 mEq/L   CO2 23 19 - 32 mEq/L  Glucose, Bld 83 70 - 99 mg/dL   BUN 18 6 - 23 mg/dL   Creatinine, Ser 0.61 0.40 - 1.20 mg/dL   Total Bilirubin 0.6 0.2 - 1.2 mg/dL   Alkaline Phosphatase 43 39 - 117 U/L   AST 20 0 - 37 U/L   ALT 24 0 - 35 U/L   Total Protein 7.5 6.0 - 8.3 g/dL   Albumin 4.3 3.5 - 5.2 g/dL   Calcium 9.2 8.4 - 10.5 mg/dL   GFR 120.15 >60.00 mL/min  Vitamin B12  Result Value Ref Range   Vitamin B-12 241 211 - 911 pg/mL  Insulin, random  Result Value Ref Range   Insulin 19.2 2.0 - 19.6 uIU/mL      Assessment & Plan:   Problem List Items Addressed This Visit    Toxic effect of fish and shellfish eaten as food   Relevant Orders   Ambulatory  referral to Allergy   Throat swelling - Primary    Benign exam however concerning history of intermittent throat swelling to unclear food triggers. Will refer to allergist for further evaluation. Not consistent with allergic rhinitis, sinusitis at this time. rec start daily second generation antihistamine with PRN benadryl.      Relevant Orders   Ambulatory referral to Allergy       No orders of the defined types were placed in this encounter.  Orders Placed This Encounter  Procedures  . Ambulatory referral to Allergy    Referral Priority:   Routine    Referral Type:   Allergy Testing    Referral Reason:   Specialty Services Required    Requested Specialty:   Allergy    Number of Visits Requested:   1    Follow up plan: Return if symptoms worsen or fail to improve.  [name], MD

## 2018-01-26 NOTE — Assessment & Plan Note (Signed)
Benign exam however concerning history of intermittent throat swelling to unclear food triggers. Will refer to allergist for further evaluation. Not consistent with allergic rhinitis, sinusitis at this time. rec start daily second generation antihistamine with PRN benadryl.

## 2018-01-30 ENCOUNTER — Encounter (INDEPENDENT_AMBULATORY_CARE_PROVIDER_SITE_OTHER): Payer: Self-pay | Admitting: Physician Assistant

## 2018-02-04 ENCOUNTER — Ambulatory Visit (INDEPENDENT_AMBULATORY_CARE_PROVIDER_SITE_OTHER): Payer: 59 | Admitting: Family Medicine

## 2018-02-04 VITALS — BP 139/89 | HR 83 | Temp 99.2°F | Ht 62.0 in | Wt 249.0 lb

## 2018-02-04 DIAGNOSIS — E8881 Metabolic syndrome: Secondary | ICD-10-CM

## 2018-02-04 DIAGNOSIS — Z6841 Body Mass Index (BMI) 40.0 and over, adult: Secondary | ICD-10-CM

## 2018-02-04 DIAGNOSIS — F3289 Other specified depressive episodes: Secondary | ICD-10-CM

## 2018-02-04 DIAGNOSIS — Z9189 Other specified personal risk factors, not elsewhere classified: Secondary | ICD-10-CM | POA: Diagnosis not present

## 2018-02-05 ENCOUNTER — Encounter (INDEPENDENT_AMBULATORY_CARE_PROVIDER_SITE_OTHER): Payer: Self-pay | Admitting: Family Medicine

## 2018-02-08 MED ORDER — BUPROPION HCL ER (SR) 150 MG PO TB12: 150.0000 mg | ORAL_TABLET | Freq: Every day | ORAL | 0 refills | Status: DC

## 2018-02-08 MED FILL — BUPROPION SR 150 MG TABLET: 150 | 30 days supply | Qty: 30 | Fill #0

## 2018-02-08 NOTE — Telephone Encounter (Signed)
Did you get this?

## 2018-02-09 ENCOUNTER — Ambulatory Visit: Payer: 59 | Admitting: Allergy and Immunology

## 2018-02-09 ENCOUNTER — Encounter: Payer: Self-pay | Admitting: Allergy and Immunology

## 2018-02-09 VITALS — BP 136/78 | HR 78 | Temp 98.3°F | Resp 18 | Ht 63.0 in | Wt 262.0 lb

## 2018-02-09 DIAGNOSIS — T7803XA Anaphylactic reaction due to other fish, initial encounter: Secondary | ICD-10-CM | POA: Insufficient documentation

## 2018-02-09 DIAGNOSIS — T7840XD Allergy, unspecified, subsequent encounter: Secondary | ICD-10-CM

## 2018-02-09 DIAGNOSIS — T783XXD Angioneurotic edema, subsequent encounter: Secondary | ICD-10-CM

## 2018-02-09 DIAGNOSIS — L500 Allergic urticaria: Secondary | ICD-10-CM

## 2018-02-09 DIAGNOSIS — Z91018 Allergy to other foods: Secondary | ICD-10-CM

## 2018-02-09 MED ORDER — EPINEPHRINE 0.3 MG/0.3ML IJ SOAJ: 0.3000 mg | Freq: Once | INTRAMUSCULAR | 1 refills | Status: AC

## 2018-02-09 MED ORDER — RANITIDINE HCL 150 MG PO TABS: 150.0000 mg | ORAL_TABLET | Freq: Two times a day (BID) | ORAL | 3 refills | Status: DC | PRN

## 2018-02-09 MED ORDER — LEVOCETIRIZINE DIHYDROCHLORIDE 5 MG PO TABS: 5.0000 mg | ORAL_TABLET | Freq: Every evening | ORAL | 5 refills | Status: DC

## 2018-02-09 NOTE — Assessment & Plan Note (Signed)
   A prescription has been provided for levocetirizine, 5 mg daily as needed.  A prescription has been provided for ranitidine 150 mg twice daily as needed.  Instructions have been discussed and provided for H1/H2 receptor blockade with titration to find lowest effective dose.  Labs have been ordered (as above).
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
## 2018-02-09 NOTE — Assessment & Plan Note (Addendum)
Food allergen skin testing revealed equivocal results to a few of the fin fish but negative results to shellfish.  However, the histamine control did not appear to be fully reactive for unclear reasons.  The following labs have been ordered: FCeRI antibody, anti-thyroglobulin antibody, thyroid peroxidase antibody, tryptase, C4 level,  serum specific IgE against fish panel, shellfish panel, and alpha gal panel.      The patient will be called with further recommendations after lab results have returned.  A laboratory order form has been provided for  For now, continue meticulous avoidance of fish and shellfish as discussed.  A prescription has been provided for epinephrine auto-injector 2 pack along with instructions for proper administration.  Should symptoms recur, a journal is to be kept recording any foods eaten, beverages consumed, and medications taken within a 6 hour time period prior to the onset of symptoms, as well as record activities being performed, and environmental conditions. For any symptoms concerning for anaphylaxis, epinephrine is to be administered and 911 is to be called immediately.

## 2018-02-09 NOTE — Patient Instructions (Addendum)
Allergic reaction Food allergen skin testing revealed equivocal results to a few of the fin fish but negative results to shellfish.  However, the histamine control did not appear to be fully reactive for unclear reasons.  The following labs have been ordered: FCeRI antibody, anti-thyroglobulin antibody, thyroid peroxidase antibody, tryptase, C4 level,  serum specific IgE against fish panel, shellfish panel, and alpha gal panel.      The patient will be called with further recommendations after lab results have returned.  A laboratory order form has been provided for  For now, continue meticulous avoidance of fish and shellfish as discussed.  A prescription has been provided for epinephrine auto-injector 2 pack along with instructions for proper administration.  Should symptoms recur, a journal is to be kept recording any foods eaten, beverages consumed, and medications taken within a 6 hour time period prior to the onset of symptoms, as well as record activities being performed, and environmental conditions. For any symptoms concerning for anaphylaxis, epinephrine is to be administered and 911 is to be called immediately.  Pruritus/history of urticaria  A prescription has been provided for levocetirizine, 5 mg daily as needed.  A prescription has been provided for ranitidine 150 mg twice daily as needed.  Instructions have been discussed and provided for H1/H2 receptor blockade with titration to find lowest effective dose.  Labs have been ordered (as above).   When lab results have returned the patient will be called with further recommendations and follow up instructions.  Urticaria (Hives)  . Levocetirizine (Xyzal) 5 mg twice a day and ranitidine (Zantac) 150 mg twice a day. If no symptoms for 7-14 days then decrease to. . Levocetirizine (Xyzal) 5 mg twice a day and ranitidine (Zantac) 150 mg once a day.  If no symptoms for 7-14 days then decrease to. . Levocetirizine (Xyzal) 5 mg  twice a day.  If no symptoms for 7-14 days then decrease to. . Levocetirizine (Xyzal) 5 mg once a day.  May use Benadryl (diphenhydramine) as needed for breakthrough symptoms       If symptoms return, then step up dosage

## 2018-02-11 DIAGNOSIS — T7840XD Allergy, unspecified, subsequent encounter: Secondary | ICD-10-CM | POA: Diagnosis not present

## 2018-02-11 DIAGNOSIS — T783XXD Angioneurotic edema, subsequent encounter: Secondary | ICD-10-CM | POA: Diagnosis not present

## 2018-02-15 ENCOUNTER — Ambulatory Visit: Payer: Self-pay | Admitting: *Deleted

## 2018-02-15 NOTE — Telephone Encounter (Signed)
Pt reports seen by Dr. Danise Mina 01/26/18, intermittent throat swelling. States had lightheadedness at that time as well.  Referral and saw allergist 02/09/18. Reports lightheadedness reoccurred yesterday, mild but constant, positional, "With any movement." Denies any other symptoms as previously, no SOB, no throat swelling. States is staying hydrated. Requesting appt to eval dizziness. Appt made for tomorrow with Dr. Danise Mina. Care advise given per protocol. Reason for Disposition . [1] MODERATE dizziness (e.g., interferes with normal activities) AND [2] has been evaluated by physician for this  Answer Assessment - Initial Assessment Questions 1. DESCRIPTION: "Describe your dizziness."     Lightheadedness 2. LIGHTHEADED: "Do you feel lightheaded?" (e.g., somewhat faint, woozy, weak upon standing)     yes 3. VERTIGO: "Do you feel like either you or the room is spinning or tilting?" (i.e. vertigo)     no 4. SEVERITY: "How bad is it?"  "Do you feel like you are going to faint?" "Can you stand and walk?"   - MILD - walking normally   - MODERATE - interferes with normal activities (e.g., work, school)    - SEVERE - unable to stand, requires support to walk, feels like passing out now.      mild 5. ONSET:  "When did the dizziness begin?"     Yesterday 6. AGGRAVATING FACTORS: "Does anything make it worse?" (e.g., standing, change in head position)     Any type of movement 7. HEART RATE: "Can you tell me your heart rate?" "How many beats in 15 seconds?"  (Note: not all patients can do this)       96 8. CAUSE: "What do you think is causing the dizziness?"    unsure 9. RECURRENT SYMPTOM: "Have you had dizziness before?" If so, ask: "When was the last time?" "What happened that time?"     Saw allergist 10. OTHER SYMPTOMS: "Do you have any other symptoms?" (e.g., fever, chest pain, vomiting, diarrhea, bleeding)       none 11. PREGNANCY: "Is there any chance you are pregnant?" "When was your last  menstrual period?"       no  Protocols used: DIZZINESS [facility]

## 2018-02-16 ENCOUNTER — Encounter: Payer: Self-pay | Admitting: Family Medicine

## 2018-02-16 ENCOUNTER — Encounter: Payer: Self-pay | Admitting: Allergy and Immunology

## 2018-02-16 ENCOUNTER — Ambulatory Visit: Payer: 59 | Admitting: Family Medicine

## 2018-02-16 VITALS — BP 126/64 | HR 97 | Temp 98.5°F | Ht 63.0 in | Wt 250.2 lb

## 2018-02-16 DIAGNOSIS — R55 Syncope and collapse: Secondary | ICD-10-CM | POA: Diagnosis not present

## 2018-02-16 DIAGNOSIS — T61781D Other shellfish poisoning, accidental (unintentional), subsequent encounter: Secondary | ICD-10-CM

## 2018-02-16 DIAGNOSIS — T6191XD Toxic effect of unspecified seafood, accidental (unintentional), subsequent encounter: Secondary | ICD-10-CM

## 2018-02-16 DIAGNOSIS — R002 Palpitations: Secondary | ICD-10-CM | POA: Diagnosis not present

## 2018-02-16 NOTE — Patient Instructions (Addendum)
EKG looking ok today Labs today Stop wellbutrin.  Normal neurological exam today.

## 2018-02-16 NOTE — Progress Notes (Addendum)
BP 126/64 (BP Location: Left Arm, Patient Position: Sitting, Cuff Size: Large)   Pulse 97   Temp 98.5 F (36.9 C) (Oral)   Ht 5\' 3"  (1.6 m)   Wt 250 lb 4 oz (113.5 kg)   LMP 02/16/2018   SpO2 99%   BMI 44.33 kg/m   Orthostatic VS for the past 24 hrs (Last 3 readings):  BP- Lying BP- Standing at 3 minutes  02/16/18 1454 - 130/82  02/16/18 1451 124/70 -   CC: dizziness Subjective:    Patient ID: Alisha Wood, female    DOB: Jun 17, 1986, 32 y.o.   MRN: [id number]  HPI: Alisha Wood is a 32 y.o. female presenting on 02/16/2018 for Dizziness (C/o dizziness for last 2 days. Denies any N/V. )   2d h/o lightheadedness described as presyncope, fuzzy head (described as frontal vibration), today noticed palpitations and chest tightness, dyspnea. Increased anxiety. Denies identifiable trigger or recent stressful life event. Hasn't changed diet recently. Endorses feeling heart racing during exam, HR 80s and regular. No significant caffeine intake.   She recently started wellbutrin 150mg  SR once daily (on Wednesday) through [facility].   Denies cough, fevers/chills, throat swelling.  No leg swelling or recent car or plane rides.  She is on birth control.   Relevant past medical, surgical, family and social history reviewed and updated as indicated. Interim medical history since our last visit reviewed. Allergies and medications reviewed and updated. Outpatient Medications Prior to Visit  Medication Sig Dispense Refill  . buPROPion (WELLBUTRIN SR) 150 MG 12 hr tablet Take 1 tablet (150 mg total) by mouth daily. 30 tablet 0  . Cyanocobalamin (B-12) 1000 MCG SUBL Place 1 tablet under the tongue daily. 30 each   . levocetirizine (XYZAL) 5 MG tablet Take 1 tablet (5 mg total) by mouth every evening. 30 tablet 5  . levonorgestrel-ethinyl estradiol (SEASONALE,INTROVALE,JOLESSA) 0.15-0.03 MG tablet Take 1 tablet by mouth daily.    Marland Kitchen omeprazole (PRILOSEC) 10 MG  capsule Take 10 mg by mouth daily.    . ranitidine (ZANTAC) 150 MG tablet Take 1 tablet (150 mg total) by mouth 2 (two) times daily as needed for heartburn. 60 tablet 3   No facility-administered medications prior to visit.      Per HPI unless specifically indicated in ROS section below Review of Systems     Objective:    BP 126/64 (BP Location: Left Arm, Patient Position: Sitting, Cuff Size: Large)   Pulse 97   Temp 98.5 F (36.9 C) (Oral)   Ht 5\' 3"  (1.6 m)   Wt 250 lb 4 oz (113.5 kg)   LMP 02/16/2018   SpO2 99%   BMI 44.33 kg/m   Wt Readings from Last 3 Encounters:  02/16/18 250 lb 4 oz (113.5 kg)  02/09/18 262 lb (118.8 kg)  02/04/18 249 lb (112.9 kg)    Physical Exam  Constitutional: She appears well-developed and well-nourished. No distress.  HENT:  Mouth/Throat: Oropharynx is clear and moist. No oropharyngeal exudate.  Eyes: Pupils are equal, round, and reactive to light. EOM are normal.  Neck: Normal range of motion. Neck supple. No thyromegaly present.  Cardiovascular: Normal rate, regular rhythm and normal heart sounds.  No murmur heard. Pulmonary/Chest: Effort normal and breath sounds normal. No respiratory distress. She has no wheezes. She has no rales.  Musculoskeletal: She exhibits no edema.  Neurological: She is alert. She has normal strength. No cranial nerve deficit or sensory deficit. She  displays a negative Romberg sign.  CN 2-12 intact Station and gait intact FTN intact EOMI  Psychiatric: Her mood appears anxious.  Nursing note and vitals reviewed.  Lab Results  Component Value Date   WBC 8.6 04/21/2017   HGB 15.4 04/21/2017   HCT 45.1 04/21/2017   MCV 93 04/21/2017   PLT 237 02/10/2015    Lab Results  Component Value Date   CREATININE 0.61 01/13/2018   BUN 18 01/13/2018   NA 138 01/13/2018   K 3.9 01/13/2018   CL 108 01/13/2018   CO2 23 01/13/2018    Lab Results  Component Value Date   TSH 0.878 04/21/2017   T3TOTAL 149 04/22/2017     EKG - NSR rate 80, normal axis, intervals, no acute ST/T changes    Assessment & Plan:   Problem List Items Addressed This Visit    Toxic effect of fish and shellfish eaten as food    Allergen testing so far unrevealing. Will await full eval.       Severe obesity (BMI >= 40) ([place])    Has re established with [facility] healthy weight and wellness.       Palpitations - Primary    2d h/o presyncope, vibration feeling of head, palpitations and chest tightness associated with dyspnea. All worse after recently starting wellbutrin 150mg  SR (by bariatric center). Discussed akathesia and agitation side effects of wellbutrin. Anticipate symptoms largely related to wellbutrin - advised she stop this medication. She did have some lightheadedness and fuzzy head feeling prior to med - will monitor off med. Check labwork for further evaluation. Check EKG today.       Relevant Orders   EKG 12-Lead (Completed)   TSH   CBC with Differential/Platelet   Basic metabolic panel   T4, free    Other Visit Diagnoses    Pre-syncope           No orders of the defined types were placed in this encounter.  Orders Placed This Encounter  Procedures  . TSH  . CBC with Differential/Platelet  . Basic metabolic panel  . T4, free  . EKG 12-Lead    Follow up plan: Return if symptoms worsen or fail to improve.  [name], MD

## 2018-02-17 ENCOUNTER — Telehealth (INDEPENDENT_AMBULATORY_CARE_PROVIDER_SITE_OTHER): Payer: Self-pay | Admitting: Family Medicine

## 2018-02-17 ENCOUNTER — Encounter: Payer: Self-pay | Admitting: Family Medicine

## 2018-02-17 LAB — CBC WITH DIFFERENTIAL/PLATELET
BASOS ABS: 0.1 10*3/uL (ref 0.0–0.1)
Basophils Relative: 1.1 % (ref 0.0–3.0)
EOS PCT: 1.9 % (ref 0.0–5.0)
Eosinophils Absolute: 0.1 10*3/uL (ref 0.0–0.7)
HEMATOCRIT: 41.8 % (ref 36.0–46.0)
Hemoglobin: 14.4 g/dL (ref 12.0–15.0)
LYMPHS ABS: 1.7 10*3/uL (ref 0.7–4.0)
LYMPHS PCT: 25.6 % (ref 12.0–46.0)
MCHC: 34.4 g/dL (ref 30.0–36.0)
MCV: 91.7 fl (ref 78.0–100.0)
MONOS PCT: 6.8 % (ref 3.0–12.0)
Monocytes Absolute: 0.4 10*3/uL (ref 0.1–1.0)
NEUTROS ABS: 4.3 10*3/uL (ref 1.4–7.7)
Neutrophils Relative %: 64.6 % (ref 43.0–77.0)
Platelets: 247 10*3/uL (ref 150.0–400.0)
RBC: 4.56 Mil/uL (ref 3.87–5.11)
RDW: 13.4 % (ref 11.5–15.5)
WBC: 6.6 10*3/uL (ref 4.0–10.5)

## 2018-02-17 LAB — BASIC METABOLIC PANEL
BUN: 15 mg/dL (ref 6–23)
CALCIUM: 9.5 mg/dL (ref 8.4–10.5)
CHLORIDE: 107 meq/L (ref 96–112)
CO2: 24 meq/L (ref 19–32)
CREATININE: 0.67 mg/dL (ref 0.40–1.20)
GFR: 107.76 mL/min (ref >60.00)
Glucose, Bld: 87 mg/dL (ref 70–99)
Potassium: 3.7 mEq/L (ref 3.5–5.1)
Sodium: 139 mEq/L (ref 135–145)

## 2018-02-17 LAB — T4, FREE: Free T4: 0.76 ng/dL (ref 0.60–1.60)

## 2018-02-17 LAB — TSH: TSH: 0.92 u[IU]/mL (ref 0.35–4.50)

## 2018-02-17 NOTE — Assessment & Plan Note (Signed)
Has re established with [facility].

## 2018-02-17 NOTE — Assessment & Plan Note (Signed)
2d h/o presyncope, vibration feeling of head, palpitations and chest tightness associated with dyspnea. All worse after recently starting wellbutrin 150mg  SR (by bariatric center). Discussed akathesia and agitation side effects of wellbutrin. Anticipate symptoms largely related to wellbutrin - advised she stop this medication. She did have some lightheadedness and fuzzy head feeling prior to med - will monitor off med. Check labwork for further evaluation. Check EKG today.

## 2018-02-17 NOTE — Telephone Encounter (Signed)
Pt thinks she is having a really bad reaction to the Welbutrin.  She went to her PCP yesterday but wants to speak to Dr. Leafy Ro about this. Thank you Maudie Mercury

## 2018-02-17 NOTE — Telephone Encounter (Signed)
Spoke with the patient who states she felt the following symptoms after starting the Wellbutrin: Fuzzy head, Tunnel vision, and dizziness. Her last dose of the Wellbutrin was 7/30. She was told by her PCP to discontinue the medication at this time. After speaking with Dr Leafy Ro spoke back with the pt and informed her to remain off the Wellbutrin at this time, drink plenty of fluids to hydrate and if her symptoms worsen to be seen by her PCP or go to the Er. Patient states the dizziness has decreased today. Patient verbalized understanding. April, [place]

## 2018-02-17 NOTE — Assessment & Plan Note (Signed)
Allergen testing so far unrevealing. Will await full eval.

## 2018-02-19 LAB — ALPHA-GAL PANEL
Alpha Gal IgE*: <0.1 kU/L (ref <0.10)
Beef (Bos spp) IgE: <0.1 kU/L (ref <0.35)
Class Interpretation: 0
Class Interpretation: 0
Class Interpretation: 0
Lamb/Mutton (Ovis spp) IgE: <0.1 kU/L (ref <0.35)
Pork (Sus spp) IgE: <0.1 kU/L (ref <0.35)

## 2018-02-19 LAB — C4 COMPLEMENT: Complement C4, Serum: 21 mg/dL (ref 14–44)

## 2018-02-19 LAB — ALLERGEN PROFILE, FOOD-FISH
Allergen Mackerel IgE: <0.1 kU/L
Allergen Salmon IgE: <0.1 kU/L
Allergen Trout IgE: <0.1 kU/L
Allergen Walley Pike IgE: <0.1 kU/L
Codfish IgE: <0.1 kU/L
Halibut IgE: <0.1 kU/L
Tuna: <0.1 kU/L

## 2018-02-19 LAB — ALLERGEN PROFILE, SHELLFISH
Clam IgE: <0.1 kU/L
F023-IgE Crab: <0.1 kU/L
F080-IgE Lobster: <0.1 kU/L
F290-IgE Oyster: <0.1 kU/L
Scallop IgE: <0.1 kU/L
Shrimp IgE: <0.1 kU/L

## 2018-02-19 LAB — THYROGLOBULIN LEVEL: Thyroglobulin (TG-RIA): 18 ng/mL

## 2018-02-19 LAB — TRYPTASE: Tryptase: 3.2 ug/L (ref 2.2–13.2)

## 2018-02-19 LAB — THYROID PEROXIDASE ANTIBODY: Thyroperoxidase Ab SerPl-aCnc: 12 IU/mL (ref 0–34)

## 2018-02-19 LAB — CHRONIC URTICARIA: cu index: 1.8 (ref <10)

## 2018-02-22 ENCOUNTER — Encounter: Payer: Self-pay | Admitting: Family Medicine

## 2018-02-25 ENCOUNTER — Ambulatory Visit (INDEPENDENT_AMBULATORY_CARE_PROVIDER_SITE_OTHER): Payer: 59 | Admitting: Family Medicine

## 2018-02-25 VITALS — BP 133/86 | HR 80 | Temp 98.6°F | Ht 63.0 in | Wt 245.0 lb

## 2018-02-25 DIAGNOSIS — Z9189 Other specified personal risk factors, not elsewhere classified: Secondary | ICD-10-CM

## 2018-02-25 DIAGNOSIS — Z6841 Body Mass Index (BMI) 40.0 and over, adult: Secondary | ICD-10-CM

## 2018-02-25 DIAGNOSIS — K219 Gastro-esophageal reflux disease without esophagitis: Secondary | ICD-10-CM | POA: Diagnosis not present

## 2018-02-25 MED ORDER — RANITIDINE HCL 150 MG PO TABS: 150.0000 mg | ORAL_TABLET | Freq: Every day | ORAL | 0 refills | Status: DC

## 2018-02-25 MED FILL — SETLAKIN 0.15 MG-0.03 MG TA: 0.15-0.03 | 91 days supply | Qty: 91 | Fill #2
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
## 2018-03-18 ENCOUNTER — Ambulatory Visit (INDEPENDENT_AMBULATORY_CARE_PROVIDER_SITE_OTHER): Payer: Self-pay | Admitting: Family Medicine

## 2018-03-30 ENCOUNTER — Encounter: Payer: Self-pay | Admitting: Allergy and Immunology

## 2018-03-30 ENCOUNTER — Ambulatory Visit (INDEPENDENT_AMBULATORY_CARE_PROVIDER_SITE_OTHER): Payer: 59 | Admitting: Allergy and Immunology

## 2018-03-30 VITALS — BP 120/78 | HR 80 | Resp 20

## 2018-03-30 DIAGNOSIS — T7800XD Anaphylactic reaction due to unspecified food, subsequent encounter: Secondary | ICD-10-CM

## 2018-03-30 NOTE — Assessment & Plan Note (Addendum)
The patient failed the oral challenge today with symptoms occurring after 1.5 g of shrimp consumption.  Continue meticulous avoidance of shellfish and fish and have access to epinephrine autoinjectors.  A food allergy action plan has been generated and discussed.

## 2018-03-30 NOTE — Progress Notes (Signed)
    Follow-up Note  RE: Alisha Wood MRN: [id number] DOB: 1986-01-05 Date of Office Visit: 03/30/2018  Primary care provider: [name], MD Referring provider: [name], MD  History of present illness: Alisha Wood is a 32 y.o. female with history suggestive of food allergy to shellfish and fish.  However, she had negative skin test and blood work to these foods.  Therefore we will be proceeding with an open graded oral challenge to shrimp today.   Assessment and plan: Food allergy The patient failed the oral challenge today with symptoms occurring after 1.5 g of shrimp consumption.  Continue meticulous avoidance of shellfish and fish and have access to epinephrine autoinjectors.  A food allergy action plan has been generated and discussed.   Diagnostics: Open graded oral challenge at term: Failed.    Physical examination: Blood pressure 120/78, pulse 80, resp. rate 20, currently breastfeeding.  General: Alert, interactive, in no acute distress. Neck: Supple without lymphadenopathy. Lungs: Clear to auscultation without wheezing, rhonchi or rales. CV: Normal S1, S2 without murmurs. Skin: Warm and dry, without lesions or rashes.  The following portions of the patient's history were reviewed and updated as appropriate: allergies, current medications, past family history, past medical history, past social history, past surgical history and problem list.  Allergies as of 03/30/2018      Reactions   Other Anaphylaxis   Seafood allergy   Wellbutrin [bupropion]    Dizziness, Tunnel vision, Fuzzy head   Sulfa Antibiotics Rash      Medication List        Accurate as of 03/30/18 12:29 PM. Always use your most recent med list.          B-12 1000 MCG Subl Place 1 tablet under the tongue daily.   levocetirizine 5 MG tablet Commonly known as:  XYZAL Take 1 tablet (5 mg total) by mouth every evening.   levonorgestrel-ethinyl estradiol 0.15-0.03 MG  tablet Commonly known as:  SEASONALE,INTROVALE,JOLESSA Take 1 tablet by mouth daily.   ranitidine 150 MG tablet Commonly known as:  ZANTAC Take 1 tablet (150 mg total) by mouth daily.       Allergies  Allergen Reactions  . Other Anaphylaxis    Seafood allergy  . Wellbutrin [Bupropion]     Dizziness, Tunnel vision, Fuzzy head  . Sulfa Antibiotics Rash    I appreciate the opportunity to take part in Porfiria's care. Please do not hesitate to contact me with questions.  Sincerely,   R. Edgar Frisk, MD

## 2018-03-30 NOTE — Patient Instructions (Addendum)
Food allergy The patient failed the oral challenge today with symptoms occurring after 1.5 g of shrimp consumption.  Continue meticulous avoidance of shellfish and fish and have access to epinephrine autoinjectors.  A food allergy action plan has been generated and discussed.

## 2018-03-31 ENCOUNTER — Encounter (INDEPENDENT_AMBULATORY_CARE_PROVIDER_SITE_OTHER): Payer: Self-pay

## 2018-03-31 ENCOUNTER — Ambulatory Visit (INDEPENDENT_AMBULATORY_CARE_PROVIDER_SITE_OTHER): Payer: Self-pay | Admitting: Family Medicine

## 2018-04-20 ENCOUNTER — Encounter: Payer: Self-pay | Admitting: Family Medicine

## 2018-04-20 ENCOUNTER — Ambulatory Visit: Payer: 59 | Admitting: Family Medicine

## 2018-04-20 DIAGNOSIS — Z23 Encounter for immunization: Secondary | ICD-10-CM

## 2018-04-20 DIAGNOSIS — T7803XD Anaphylactic reaction due to other fish, subsequent encounter: Secondary | ICD-10-CM

## 2018-04-20 DIAGNOSIS — K760 Fatty (change of) liver, not elsewhere classified: Secondary | ICD-10-CM | POA: Diagnosis not present

## 2018-04-20 MED ORDER — OMEPRAZOLE 20 MG PO CPDR: 20.0000 mg | DELAYED_RELEASE_CAPSULE | Freq: Every day | ORAL | 3 refills | Status: DC

## 2018-04-20 NOTE — Assessment & Plan Note (Signed)
rec avoid all seafood due to allergy confirmed by allergist.

## 2018-04-20 NOTE — Progress Notes (Signed)
BP 122/78 (BP Location: Left Arm, Patient Position: Sitting, Cuff Size: Large)   Pulse 79   Temp 98.5 F (36.9 C) (Oral)   Ht 5\' 3"  (1.6 m)   Wt 262 lb (118.8 kg)   LMP 03/21/2018 Comment: Has every 3 mos  SpO2 97%   BMI 46.41 kg/m    CC: 3 mo f/u visit Subjective:    Patient ID: Lenox Ahr, female    DOB: [birth year], 32 y.o.   MRN: [id number]  HPI: FLORABELLE CARDIN is a 32 y.o. female presenting on 04/20/2018 for 3 mo follow up (Here for wt f/u. Wants to discuss flu shot. )   Poor reaction to wellbutrin - now off this medicine.  Saw allergist - positive reaction to shrimp and shellfish, as well as salmon and bass - although she had negative blood and skin testing. rec avoid all seafood, carries epi pen.   Unable to return to [facility] wellness center for weight management due to child care issues.  Weight gain noted over the past few months - struggling with healthy food choices. Currently around 2000cal/day intake. Wants to shoot for 1300 cal/day diet, along with 80 grams of protein.   Exercise - planning to start walking several times a week with friend Chanetta Marshall.   Hesitant for bariatric medication with wellbutrin experience, although she previously did tolerate phentermine 30mg  well (but led to some insomnia after taking for a few weeks).  Weight peak 262 lbs (03/2018)  Relevant past medical, surgical, family and social history reviewed and updated as indicated. Interim medical history since our last visit reviewed. Allergies and medications reviewed and updated. Outpatient Medications Prior to Visit  Medication Sig Dispense Refill  . levonorgestrel-ethinyl estradiol (SEASONALE,INTROVALE,JOLESSA) 0.15-0.03 MG tablet Take 1 tablet by mouth daily.    . Cyanocobalamin (B-12) 1000 MCG SUBL Place 1 tablet under the tongue daily. 30 each   . levocetirizine (XYZAL) 5 MG tablet Take 1 tablet (5 mg total) by mouth every evening. (Patient taking differently: Take 5 mg by  mouth every evening. As needed) 30 tablet 5  . ranitidine (ZANTAC) 150 MG tablet Take 1 tablet (150 mg total) by mouth daily. 30 tablet 0   No facility-administered medications prior to visit.      Per HPI unless specifically indicated in ROS section below Review of Systems     Objective:    BP 122/78 (BP Location: Left Arm, Patient Position: Sitting, Cuff Size: Large)   Pulse 79   Temp 98.5 F (36.9 C) (Oral)   Ht 5\' 3"  (1.6 m)   Wt 262 lb (118.8 kg)   LMP 03/21/2018 Comment: Has every 3 mos  SpO2 97%   BMI 46.41 kg/m   Wt Readings from Last 3 Encounters:  04/20/18 262 lb (118.8 kg)  02/25/18 245 lb (111.1 kg)  02/16/18 250 lb 4 oz (113.5 kg)    Physical Exam  Constitutional: She appears well-developed and well-nourished. No distress.  Psychiatric: She has a normal mood and affect.  Nursing note and vitals reviewed.  Results for orders placed or performed in visit on 02/16/18  TSH  Result Value Ref Range   TSH 0.92 0.35 - 4.50 uIU/mL  CBC with Differential/Platelet  Result Value Ref Range   WBC 6.6 4.0 - 10.5 K/uL   RBC 4.56 3.87 - 5.11 Mil/uL   Hemoglobin 14.4 12.0 - 15.0 g/dL   HCT 41.8 36.0 - 46.0 %   MCV 91.7 78.0 - 100.0 fl  MCHC 34.4 30.0 - 36.0 g/dL   RDW 13.4 11.5 - 15.5 %   Platelets 247.0 150.0 - 400.0 K/uL   Neutrophils Relative % 64.6 43.0 - 77.0 %   Lymphocytes Relative 25.6 12.0 - 46.0 %   Monocytes Relative 6.8 3.0 - 12.0 %   Eosinophils Relative 1.9 0.0 - 5.0 %   Basophils Relative 1.1 0.0 - 3.0 %   Neutro Abs 4.3 1.4 - 7.7 K/uL   Lymphs Abs 1.7 0.7 - 4.0 K/uL   Monocytes Absolute 0.4 0.1 - 1.0 K/uL   Eosinophils Absolute 0.1 0.0 - 0.7 K/uL   Basophils Absolute 0.1 0.0 - 0.1 K/uL  Basic metabolic panel  Result Value Ref Range   Sodium 139 135 - 145 mEq/L   Potassium 3.7 3.5 - 5.1 mEq/L   Chloride 107 96 - 112 mEq/L   CO2 24 19 - 32 mEq/L   Glucose, Bld 87 70 - 99 mg/dL   BUN 15 6 - 23 mg/dL   Creatinine, Ser 0.67 0.40 - 1.20 mg/dL    Calcium 9.5 8.4 - 10.5 mg/dL   GFR 107.76 >60.00 mL/min  T4, free  Result Value Ref Range   Free T4 0.76 0.60 - 1.60 ng/dL      Assessment & Plan:   Problem List Items Addressed This Visit    Severe obesity (BMI >= 40) (HCC) - Primary    Would like to continue seeing [place] for this. Declines bariatric medication at this time (hesitant due to recent wellbutrin reaction).  Reviewed exercise plan - walking at local park 2-3 times weekly with friend with better weather.  Reviewed nutrition plan - goal 1300cal/day diet.  RTC 1 mo f/u visit.       Seafood allergy, anaphylaxis    rec avoid all seafood due to allergy confirmed by allergist.       NAFLD (nonalcoholic fatty liver disease)       Meds ordered this encounter  Medications  . omeprazole (PRILOSEC) 20 MG capsule    Sig: Take 1 capsule (20 mg total) by mouth daily.    Dispense:  30 capsule    Refill:  3   No orders of the defined types were placed in this encounter.   Follow up plan: Return in about 4 weeks (around 05/18/2018) for follow up visit.  [name], MD

## 2018-04-20 NOTE — Assessment & Plan Note (Signed)
Would like to continue seeing [place] for this. Declines bariatric medication at this time (hesitant due to recent wellbutrin reaction).  Reviewed exercise plan - walking at local park 2-3 times weekly with friend with better weather.  Reviewed nutrition plan - goal 1300cal/day diet.  RTC 1 mo f/u visit.

## 2018-04-20 NOTE — Patient Instructions (Addendum)
Work on regular walking routine 2-3 times a week  Goal 1300 cal/day - start counting calories.  Return in 1 month for follow up visit.  Flu shot today.

## 2018-05-25 ENCOUNTER — Ambulatory Visit: Payer: 59 | Admitting: Family Medicine

## 2018-06-15 MED FILL — SETLAKIN 0.15 MG-0.03 MG TA: 0.15-0.03 | 91 days supply | Qty: 91 | Fill #3

## 2018-06-22 ENCOUNTER — Ambulatory Visit: Payer: 59 | Admitting: Family Medicine

## 2018-09-02 ENCOUNTER — Telehealth: Payer: Self-pay

## 2018-09-02 ENCOUNTER — Ambulatory Visit: Payer: 59 | Admitting: Internal Medicine

## 2018-09-02 ENCOUNTER — Encounter: Payer: Self-pay | Admitting: Internal Medicine

## 2018-09-02 VITALS — BP 140/96 | HR 92 | Temp 98.8°F | Wt 272.0 lb

## 2018-09-02 DIAGNOSIS — R030 Elevated blood-pressure reading, without diagnosis of hypertension: Secondary | ICD-10-CM | POA: Diagnosis not present

## 2018-09-02 NOTE — Telephone Encounter (Signed)
Pt said H/A pain level 3, dizziness on and off but not today. Last night BP 154/103; pt said BP elevated last couple of days; cannot ck BP now due to cuff being at her parents home. No CP, SOB, N&V. Pt has not been under stress or in pain but pt said she has gained 32 lbs in last year. Pt is not on any BP med. Pt has 30' appt 09/02/18 at 11:30.

## 2018-09-02 NOTE — Patient Instructions (Signed)
DASH Eating Plan  DASH stands for "Dietary Approaches to Stop Hypertension." The DASH eating plan is a healthy eating plan that has been shown to reduce high blood pressure (hypertension). It may also reduce your risk for type 2 diabetes, heart disease, and stroke. The DASH eating plan may also help with weight loss.  What are tips for following this plan?    General guidelines   Avoid eating more than 2,300 mg (milligrams) of salt (sodium) a day. If you have hypertension, you may need to reduce your sodium intake to 1,500 mg a day.   Limit alcohol intake to no more than 1 drink a day for nonpregnant women and 2 drinks a day for men. One drink equals 12 oz of beer, 5 oz of wine, or 1 oz of hard liquor.   Work with your health care provider to maintain a healthy body weight or to lose weight. Ask what an ideal weight is for you.   Get at least 30 minutes of exercise that causes your heart to beat faster (aerobic exercise) most days of the week. Activities may include walking, swimming, or biking.   Work with your health care provider or diet and nutrition specialist (dietitian) to adjust your eating plan to your individual calorie needs.  Reading food labels     Check food labels for the amount of sodium per serving. Choose foods with less than 5 percent of the Daily Value of sodium. Generally, foods with less than 300 mg of sodium per serving fit into this eating plan.   To find whole grains, look for the word "whole" as the first word in the ingredient list.  Shopping   Buy products labeled as "low-sodium" or "no salt added."   Buy fresh foods. Avoid canned foods and premade or frozen meals.  Cooking   Avoid adding salt when cooking. Use salt-free seasonings or herbs instead of table salt or sea salt. Check with your health care provider or pharmacist before using salt substitutes.   Do not fry foods. Cook foods using healthy methods such as baking, boiling, grilling, and broiling instead.   Cook with  heart-healthy oils, such as olive, canola, soybean, or sunflower oil.  Meal planning   Eat a balanced diet that includes:  ? 5 or more servings of fruits and vegetables each day. At each meal, try to fill half of your plate with fruits and vegetables.  ? Up to 6-8 servings of whole grains each day.  ? Less than 6 oz of lean meat, poultry, or fish each day. A 3-oz serving of meat is about the same size as a deck of cards. One egg equals 1 oz.  ? 2 servings of low-fat dairy each day.  ? A serving of nuts, seeds, or beans 5 times each week.  ? Heart-healthy fats. Healthy fats called Omega-3 fatty acids are found in foods such as flaxseeds and coldwater fish, like sardines, salmon, and mackerel.   Limit how much you eat of the following:  ? Canned or prepackaged foods.  ? Food that is high in trans fat, such as fried foods.  ? Food that is high in saturated fat, such as fatty meat.  ? Sweets, desserts, sugary drinks, and other foods with added sugar.  ? Full-fat dairy products.   Do not salt foods before eating.   Try to eat at least 2 vegetarian meals each week.   Eat more home-cooked food and less restaurant, buffet, and fast food.     When eating at a restaurant, ask that your food be prepared with less salt or no salt, if possible.  What foods are recommended?  The items listed may not be a complete list. Talk with your dietitian about what dietary choices are best for you.  Grains  Whole-grain or whole-wheat bread. Whole-grain or whole-wheat pasta. Brown rice. Oatmeal. Quinoa. Bulgur. Whole-grain and low-sodium cereals. Pita bread. Low-fat, low-sodium crackers. Whole-wheat flour tortillas.  Vegetables  Fresh or frozen vegetables (raw, steamed, roasted, or grilled). Low-sodium or reduced-sodium tomato and vegetable juice. Low-sodium or reduced-sodium tomato sauce and tomato paste. Low-sodium or reduced-sodium canned vegetables.  Fruits  All fresh, dried, or frozen fruit. Canned fruit in natural juice (without  added sugar).  Meat and other protein foods  Skinless chicken or turkey. Ground chicken or turkey. Pork with fat trimmed off. Fish and seafood. Egg whites. Dried beans, peas, or lentils. Unsalted nuts, nut butters, and seeds. Unsalted canned beans. Lean cuts of beef with fat trimmed off. Low-sodium, lean deli meat.  Dairy  Low-fat (1%) or fat-free (skim) milk. Fat-free, low-fat, or reduced-fat cheeses. Nonfat, low-sodium ricotta or cottage cheese. Low-fat or nonfat yogurt. Low-fat, low-sodium cheese.  Fats and oils  Soft margarine without trans fats. Vegetable oil. Low-fat, reduced-fat, or light mayonnaise and salad dressings (reduced-sodium). Canola, safflower, olive, soybean, and sunflower oils. Avocado.  Seasoning and other foods  Herbs. Spices. Seasoning mixes without salt. Unsalted popcorn and pretzels. Fat-free sweets.  What foods are not recommended?  The items listed may not be a complete list. Talk with your dietitian about what dietary choices are best for you.  Grains  Baked goods made with fat, such as croissants, muffins, or some breads. Dry pasta or rice meal packs.  Vegetables  Creamed or fried vegetables. Vegetables in a cheese sauce. Regular canned vegetables (not low-sodium or reduced-sodium). Regular canned tomato sauce and paste (not low-sodium or reduced-sodium). Regular tomato and vegetable juice (not low-sodium or reduced-sodium). Pickles. Olives.  Fruits  Canned fruit in a light or heavy syrup. Fried fruit. Fruit in cream or butter sauce.  Meat and other protein foods  Fatty cuts of meat. Ribs. Fried meat. Bacon. Sausage. Bologna and other processed lunch meats. Salami. Fatback. Hotdogs. Bratwurst. Salted nuts and seeds. Canned beans with added salt. Canned or smoked fish. Whole eggs or egg yolks. Chicken or turkey with skin.  Dairy  Whole or 2% milk, cream, and half-and-half. Whole or full-fat cream cheese. Whole-fat or sweetened yogurt. Full-fat cheese. Nondairy creamers. Whipped toppings.  Processed cheese and cheese spreads.  Fats and oils  Butter. Stick margarine. Lard. Shortening. Ghee. Bacon fat. Tropical oils, such as coconut, palm kernel, or palm oil.  Seasoning and other foods  Salted popcorn and pretzels. Onion salt, garlic salt, seasoned salt, table salt, and sea salt. Worcestershire sauce. Tartar sauce. Barbecue sauce. Teriyaki sauce. Soy sauce, including reduced-sodium. Steak sauce. Canned and packaged gravies. Fish sauce. Oyster sauce. Cocktail sauce. Horseradish that you find on the shelf. Ketchup. Mustard. Meat flavorings and tenderizers. Bouillon cubes. Hot sauce and Tabasco sauce. Premade or packaged marinades. Premade or packaged taco seasonings. Relishes. Regular salad dressings.  Where to find more information:   National Heart, Lung, and Blood Institute: www.nhlbi.nih.gov   American Heart Association: www.heart.org  Summary   The DASH eating plan is a healthy eating plan that has been shown to reduce high blood pressure (hypertension). It may also reduce your risk for type 2 diabetes, heart disease, and stroke.   With the   DASH eating plan, you should limit salt (sodium) intake to 2,300 mg a day. If you have hypertension, you may need to reduce your sodium intake to 1,500 mg a day.   When on the DASH eating plan, aim to eat more fresh fruits and vegetables, whole grains, lean proteins, low-fat dairy, and heart-healthy fats.   Work with your health care provider or diet and nutrition specialist (dietitian) to adjust your eating plan to your individual calorie needs.  This information is not intended to replace advice given to you by your health care provider. Make sure you discuss any questions you have with your health care provider.  Document Released: 06/26/2011 Document Revised: 06/30/2016 Document Reviewed: 06/30/2016  Elsevier Interactive Patient Education  2019 Elsevier Inc.

## 2018-09-02 NOTE — Telephone Encounter (Signed)
See Epic encounter for office visit note.

## 2018-09-05 ENCOUNTER — Encounter: Payer: Self-pay | Admitting: Internal Medicine

## 2018-09-05 NOTE — Progress Notes (Signed)
Subjective:    Patient ID: Alisha Wood, female    DOB: 1986/01/03, 33 y.o.   MRN: [id number]  HPI  Pt presents to the clinic today with c/o elevated blood pressure and intermittent headaches. She noticed this about 1 month ago. She reports her BP at home has been 153/104. The headaches are generalized. She describes the pain as pressure. She denies visual changes or dizziness. She denies chest pain, chest tightness or shortness of breath. Her BP today is 140/96. She denies feelings of extreme stress or being overwhelmed. She has gained 40 lbs in the last few years which she thinks may be a contributing factor.  Review of Systems  Past Medical History:  Diagnosis Date  . Angio-edema   . Back pain   . Fatty liver   . Heartburn   . Leg fracture 1995   right  . Migraines    common and optic, controlled with alleve  . NAFLD (nonalcoholic fatty liver disease) 03/2014   mild by [place], normal viral hep panel, iron levels, TSH  . Postpartum care following cesarean delivery (7/19) 02/06/2015  . Pregnancy induced hypertension   . Severe obesity (BMI >= 40) ([place]) 08/30/2012   Saw nutritionist 06/2016  . Urticaria    graduation from high school. stress induced urticaria(only time)    Current Outpatient Medications  Medication Sig Dispense Refill  . levonorgestrel-ethinyl estradiol (SEASONALE,INTROVALE,JOLESSA) 0.15-0.03 MG tablet Take 1 tablet by mouth daily.    . Multiple Vitamins-Minerals (MULTIVITAMIN GUMMIES ADULT PO) Take by mouth.    Marland Kitchen omeprazole (PRILOSEC) 20 MG capsule Take 1 capsule (20 mg total) by mouth daily. 30 capsule 3   No current facility-administered medications for this visit.     Allergies  Allergen Reactions  . Other Anaphylaxis    Seafood allergy  . Wellbutrin [Bupropion]     Dizziness, Tunnel vision, Fuzzy head  . Sulfa Antibiotics Rash    Family History  Problem Relation Age of Onset  . Hypertension Father   . Hyperlipidemia Father   . Diabetes type II  Father   . Heart attack Father   . Diabetes Father   . Coronary artery disease Father 15  . Heart failure Father   . Kidney disease Father   . Sleep apnea Father   . Alcoholism Father   . Obesity Father   . Diabetes type II Mother        controlled  . Hyperlipidemia Mother   . Diabetes Mother   . Obesity Mother   . Cancer Maternal Uncle        lung, brain  . Cancer Maternal Grandmother        lung, brain  . Coronary artery disease Maternal Grandfather   . Cancer Paternal Grandfather        prostate    Social History   Socioeconomic History  . Marital status: Married    Spouse name: Pilar Plate  . Number of children: 1  . Years of education: Not on file  . Highest education level: Not on file  Occupational History  . Occupation: Facilities manager: [employer]  Social Needs  . Financial resource strain: Not on file  . Food insecurity:    Worry: Not on file    Inability: Not on file  . Transportation needs:    Medical: Not on file    Non-medical: Not on file  Tobacco Use  . Smoking status: Never Smoker  . Smokeless tobacco: Never Used  Substance and Sexual Activity  . Alcohol use: No  . Drug use: No  . Sexual activity: Yes    Birth control/protection: None  Lifestyle  . Physical activity:    Days per week: Not on file    Minutes per session: Not on file  . Stress: Not on file  Relationships  . Social connections:    Talks on phone: Not on file    Gets together: Not on file    Attends religious service: Not on file    Active member of club or organization: Not on file    Attends meetings of clubs or organizations: Not on file    Relationship status: Not on file  . Intimate partner violence:    Fear of current or ex partner: Not on file    Emotionally abused: Not on file    Physically abused: Not on file    Forced sexual activity: Not on file  Other Topics Concern  . Not on file  Social History Narrative   Caffeine: 4 cans diet sodas/day   Married with one  son, no pets   Occupation: ICU RN at [employer]   Edu: getting bachelor's   Activity: no regular activity   Diet: fruits/vegetables daily, red meat rarely, fish rarely     Constitutional: Pt reports intermittent headaches. Denies fever, malaise, fatigue, or abrupt weight changes.  HEENT: Denies eye pain, eye redness, ear pain, ringing in the ears, wax buildup, runny nose, nasal congestion, bloody nose, or sore throat. Respiratory: Denies difficulty breathing, shortness of breath, cough or sputum production.   Cardiovascular: Denies chest pain, chest tightness, palpitations or swelling in the hands or feet.  Musculoskeletal: Denies decrease in range of motion, difficulty with gait, muscle pain or joint pain and swelling.  Neurological: Denies dizziness, difficulty with memory, difficulty with speech or problems with balance and coordination.    No other specific complaints in a complete review of systems (except as listed in HPI above).     Objective:   Physical Exam   BP (!) 140/96 (BP Location: Right Arm)   Pulse 92   Temp 98.8 F (37.1 C) (Oral)   Wt 272 lb (123.4 kg)   SpO2 99%   BMI 48.18 kg/m  Wt Readings from Last 3 Encounters:  09/02/18 272 lb (123.4 kg)  04/20/18 262 lb (118.8 kg)  02/25/18 245 lb (111.1 kg)    General: Appears her stated age, obese, in NAD. HEENT: Head: normal shape and size; Eyes: sclera white, no icterus, conjunctiva pink, PERRLA and EOMs intact;  Cardiovascular: Normal rate and rhythm. S1,S2 noted.  No murmur, rubs or gallops noted.  Pulmonary/Chest: Normal effort and positive vesicular breath sounds. No respiratory distress. No wheezes, rales or ronchi noted.  Neurological: Alert and oriented. Coordination normal.   BMET    Component Value Date/Time   NA 139 02/16/2018 1546   NA 142 08/17/2017 1115   K 3.7 02/16/2018 1546   K 4.0 03/24/2014   CL 107 02/16/2018 1546   CO2 24 02/16/2018 1546   GLUCOSE 87 02/16/2018 1546   BUN 15 02/16/2018  1546   BUN 19 08/17/2017 1115   CREATININE 0.67 02/16/2018 1546   CREATININE 0.61 03/24/2014   CALCIUM 9.5 02/16/2018 1546   GFRNONAA 101 08/17/2017 1115   GFRAA 116 08/17/2017 1115    Lipid Panel     Component Value Date/Time   CHOL 192 01/13/2018 1256   CHOL 176 08/17/2017 1115   TRIG 66.0 01/13/2018 1256  TRIG 93 03/30/2014   HDL 43.50 01/13/2018 1256   HDL 43 08/17/2017 1115   CHOLHDL 4 01/13/2018 1256   VLDL 13.2 01/13/2018 1256   LDLCALC 136 (H) 01/13/2018 1256   LDLCALC 118 (H) 08/17/2017 1115   LDLCALC 101 03/30/2014    CBC    Component Value Date/Time   WBC 6.6 02/16/2018 1546   RBC 4.56 02/16/2018 1546   HGB 14.4 02/16/2018 1546   HGB 15.4 04/21/2017 1125   HGB 14.2 02/11/2013   HCT 41.8 02/16/2018 1546   HCT 45.1 04/21/2017 1125   PLT 247.0 02/16/2018 1546   MCV 91.7 02/16/2018 1546   MCV 93 04/21/2017 1125   MCH 31.6 04/21/2017 1125   MCH 32.2 02/10/2015 0425   MCHC 34.4 02/16/2018 1546   RDW 13.4 02/16/2018 1546   RDW 13.6 04/21/2017 1125   LYMPHSABS 1.7 02/16/2018 1546   LYMPHSABS 2.2 04/21/2017 1125   MONOABS 0.4 02/16/2018 1546   EOSABS 0.1 02/16/2018 1546   EOSABS 0.1 04/21/2017 1125   BASOSABS 0.1 02/16/2018 1546   BASOSABS 0.0 04/21/2017 1125    Hgb A1C Lab Results  Component Value Date   HGBA1C 5.3 08/17/2017           Assessment & Plan:   Elevated Blood Pressure without Diagnosis of HTN:  Discussed normal BP readings Discussed criteria for diagnosis of HTN She is hesitant to start medication at this time. Discussed long term risk of uncontrolled HTN including heart attack, stroke and death Discussed DASH diet and exercise for weight loss  RTC in 1 month for BP follow up Webb Silversmith, NP

## 2018-09-22 MED FILL — SETLAKIN 0.15 MG-0.03 MG TA: 0.15-0.03 | 91 days supply | Qty: 91 | Fill #0

## 2018-10-01 ENCOUNTER — Telehealth (INDEPENDENT_AMBULATORY_CARE_PROVIDER_SITE_OTHER): Payer: Self-pay | Admitting: Family

## 2018-10-01 DIAGNOSIS — J069 Acute upper respiratory infection, unspecified: Secondary | ICD-10-CM

## 2018-10-01 MED ORDER — FLUTICASONE PROPIONATE 50 MCG/ACT NA SUSP: 2.0000 | Freq: Every day | NASAL | 6 refills | Status: DC

## 2018-10-01 MED ORDER — BENZONATATE 100 MG PO CAPS: 100.0000 mg | ORAL_CAPSULE | Freq: Two times a day (BID) | ORAL | 0 refills | Status: DC | PRN

## 2018-10-01 NOTE — Progress Notes (Signed)
Thank you for the details you included in the comment boxes. Those details are very helpful in determining the best course of treatment for you and help [place] to provide the best care.  We are sorry you are not feeling well.  Here is how we plan to help!  Based on what you have shared with me, it looks like you may have a viral upper respiratory infection.  Upper respiratory infections are caused by a large number of viruses; however, rhinovirus is the most common cause.   Symptoms vary from person to person, with common symptoms including sore throat, cough, and fatigue or lack of energy.  A low-grade fever of up to 100.4 may present, but is often uncommon.  Symptoms vary however, and are closely related to a person's age or underlying illnesses.  The most common symptoms associated with an upper respiratory infection are nasal discharge or congestion, cough, sneezing, headache and pressure in the ears and face.  These symptoms usually persist for about 3 to 10 days, but can last up to 2 weeks.  It is important to know that upper respiratory infections do not cause serious illness or complications in most cases.    Upper respiratory infections can be transmitted from person to person, with the most common method of transmission being a person's hands.  The virus is able to live on the skin and can infect other persons for up to 2 hours after direct contact.  Also, these can be transmitted when someone coughs or sneezes; thus, it is important to cover the mouth to reduce this risk.  To keep the spread of the illness at [place], good hand hygiene is very important.  This is an infection that is most likely caused by a virus. There are no specific treatments other than to help you with the symptoms until the infection runs its course.  We are sorry you are not feeling well.  Here is how we plan to help!   For nasal congestion, you may use an oral decongestants such as Mucinex D or if you have glaucoma or high  blood pressure use plain Mucinex.  Saline nasal spray or nasal drops can help and can safely be used as often as needed for congestion.  For your congestion, I have prescribed Fluticasone nasal spray one spray in each nostril twice a day  If you do not have a history of heart disease, hypertension, diabetes or thyroid disease, prostate/bladder issues or glaucoma, you may also use Sudafed to treat nasal congestion.  It is highly recommended that you consult with a pharmacist or your primary care physician to ensure this medication is safe for you to take.     If you have a cough, you may use cough suppressants such as Delsym and Robitussin.  If you have glaucoma or high blood pressure, you can also use Coricidin HBP.   For cough I have prescribed for you A prescription cough medication called Tessalon Perles 100 mg. You may take 1-2 capsules every 8 hours as needed for cough  If you have a sore or scratchy throat, use a saltwater gargle-  to  teaspoon of salt dissolved in a 4-ounce to 8-ounce glass of warm water.  Gargle the solution for approximately 15-30 seconds and then spit.  It is important not to swallow the solution.  You can also use throat lozenges/cough drops and Chloraseptic spray to help with throat pain or discomfort.  Warm or cold liquids can also be helpful in  relieving throat pain.  For headache, pain or general discomfort, you can use Ibuprofen or Tylenol as directed.   Some authorities believe that zinc sprays or the use of Echinacea may shorten the course of your symptoms.   HOME CARE . Only take medications as instructed by your medical team. . Be sure to drink plenty of fluids. Water is fine as well as fruit juices, sodas and electrolyte beverages. You may want to stay away from caffeine or alcohol. If you are nauseated, try taking small sips of liquids. How do you know if you are getting enough fluid? Your urine should be a pale yellow or almost colorless. . Get  rest. . Taking a steamy shower or using a humidifier may help nasal congestion and ease sore throat pain. You can place a towel over your head and breathe in the steam from hot water coming from a faucet. . Using a saline nasal spray works much the same way. . Cough drops, hard candies and sore throat lozenges may ease your cough. . Avoid close contacts especially the very young and the elderly . Cover your mouth if you cough or sneeze . Always remember to wash your hands.   GET HELP RIGHT AWAY IF: . You develop worsening fever. . If your symptoms do not improve within 10 days . You develop yellow or green discharge from your nose over 3 days. . You have coughing fits . You develop a severe head ache or visual changes. . You develop shortness of breath, difficulty breathing or start having chest pain . Your symptoms persist after you have completed your treatment plan  MAKE SURE YOU   Understand these instructions.  Will watch your condition.  Will get help right away if you are not doing well or get worse.  Your e-visit answers were reviewed by a board certified advanced clinical practitioner to complete your personal care plan. Depending upon the condition, your plan could have included both over the counter or prescription medications. Please review your pharmacy choice. If there is a problem, you may call our nursing hot line at and have the prescription routed to another pharmacy. Your safety is important to [place]. If you have drug allergies check your prescription carefully.   You can use MyChart to ask questions about today's visit, request a non-urgent call back, or ask for a work or school excuse for 24 hours related to this e-Visit. If it has been greater than 24 hours you will need to follow up with your provider, or enter a new e-Visit to address those concerns. You will get an e-mail in the next two days asking about your experience.  I hope that your e-visit has been valuable  and will speed your recovery. Thank you for using e-visits.

## 2018-10-06 ENCOUNTER — Encounter: Payer: Self-pay | Admitting: Family Medicine

## 2018-10-06 ENCOUNTER — Ambulatory Visit: Payer: 59 | Admitting: Family Medicine

## 2018-10-06 ENCOUNTER — Other Ambulatory Visit: Payer: Self-pay

## 2018-10-06 VITALS — BP 126/82 | HR 97 | Temp 98.2°F | Ht 63.0 in | Wt 261.1 lb

## 2018-10-06 DIAGNOSIS — J0100 Acute maxillary sinusitis, unspecified: Secondary | ICD-10-CM | POA: Insufficient documentation

## 2018-10-06 DIAGNOSIS — J019 Acute sinusitis, unspecified: Secondary | ICD-10-CM

## 2018-10-06 MED ORDER — FLUTICASONE PROPIONATE 50 MCG/ACT NA SUSP: 2.0000 | Freq: Every day | NASAL | 1 refills | Status: DC

## 2018-10-06 MED ORDER — AMOXICILLIN 875 MG PO TABS: 875.0000 mg | ORAL_TABLET | Freq: Two times a day (BID) | ORAL | 0 refills | Status: DC

## 2018-10-06 MED FILL — FLUTICASONE PROP 50 MCG SPR: 50 | 30 days supply | Qty: 16 | Fill #0

## 2018-10-06 NOTE — Assessment & Plan Note (Addendum)
Anticipate R sided sinusitis, likely viral + allergic given short duration. Supportive care reviewed as per instructions. Provided with WASP for amoxicillin in case symptoms suggestive of bacterial infection develop.

## 2018-10-06 NOTE — Progress Notes (Signed)
BP 126/82 (BP Location: Left Arm, Patient Position: Sitting, Cuff Size: Large)   Pulse 97   Temp 98.2 F (36.8 C) (Oral)   Ht 5\' 3"  (1.6 m)   Wt 261 lb 2 oz (118.4 kg)   LMP 09/16/2018   SpO2 97%   BMI 46.26 kg/m    CC: sinus congestion Subjective:    Patient ID: Alisha Wood, female    DOB: August 11, 1985, 33 y.o.   MRN: [id number]  HPI: Alisha Wood is a 33 y.o. female presenting on 10/06/2018 for Sinus Problem (C/o sinus congestion, drainage and cough. Did E-visit on 10/01/18 and suggested OTC meds. Tried Tyelonol Cold/Flu and Zyrtec, barely helpful. )   1 wk h/o sinus pressure forehead headache more on right, ear fullness more on right, tickle in back of throat, PNdrainage, initial ST, night time cough mildly productive. Some R upper tooth pain.   No fevers/chills, dyspnea, tooth pain.  No sick contacts at home.  No smokers at home.  No h/o asthma.   Did E-visit last week - recommended OTC remedies.  Only could find dayquil cold and flu - not really helping symptoms. She also tried tussin DM cough medicine this morning. Not currently taking flonase.   She has been walking regularly 30 min daily - 10 lbs down in the past month.  Significant low sodium healthy changes in diet.      Relevant past medical, surgical, family and social history reviewed and updated as indicated. Interim medical history since our last visit reviewed. Allergies and medications reviewed and updated. Outpatient Medications Prior to Visit  Medication Sig Dispense Refill  . levonorgestrel-ethinyl estradiol (SEASONALE,INTROVALE,JOLESSA) 0.15-0.03 MG tablet Take 1 tablet by mouth daily.    . benzonatate (TESSALON) 100 MG capsule Take 1 capsule (100 mg total) by mouth 2 (two) times daily as needed for cough. 20 capsule 0  . fluticasone (FLONASE) 50 MCG/ACT nasal spray Place 2 sprays into both nostrils daily. 16 g 6  . Multiple Vitamins-Minerals (MULTIVITAMIN GUMMIES ADULT PO) Take by mouth.    Marland Kitchen  omeprazole (PRILOSEC) 20 MG capsule Take 1 capsule (20 mg total) by mouth daily. 30 capsule 3   No facility-administered medications prior to visit.      Per HPI unless specifically indicated in ROS section below Review of Systems Objective:    BP 126/82 (BP Location: Left Arm, Patient Position: Sitting, Cuff Size: Large)   Pulse 97   Temp 98.2 F (36.8 C) (Oral)   Ht 5\' 3"  (1.6 m)   Wt 261 lb 2 oz (118.4 kg)   LMP 09/16/2018   SpO2 97%   BMI 46.26 kg/m   Wt Readings from Last 3 Encounters:  10/06/18 261 lb 2 oz (118.4 kg)  09/02/18 272 lb (123.4 kg)  04/20/18 262 lb (118.8 kg)    Physical Exam Vitals signs and nursing note reviewed.  Constitutional:      General: She is not in acute distress.    Appearance: Normal appearance. She is well-developed.  HENT:     Head: Normocephalic and atraumatic.     Right Ear: Hearing, ear canal and external ear normal.     Left Ear: Hearing, ear canal and external ear normal.     Ears:     Comments: Poor light reflex No erythema of TMs Bilateral congestion behind TMs    Nose: Mucosal edema, congestion and rhinorrhea present. Rhinorrhea is clear.     Right Sinus: Maxillary sinus tenderness and frontal sinus tenderness  present.     Left Sinus: No maxillary sinus tenderness or frontal sinus tenderness.     Mouth/Throat:     Mouth: Mucous membranes are moist.     Pharynx: Uvula midline. No oropharyngeal exudate or posterior oropharyngeal erythema.     Tonsils: No tonsillar abscesses.  Eyes:     General: No scleral icterus.    Conjunctiva/sclera: Conjunctivae normal.     Pupils: Pupils are equal, round, and reactive to light.  Neck:     Musculoskeletal: Normal range of motion and neck supple.  Cardiovascular:     Rate and Rhythm: Normal rate and regular rhythm.     Pulses: Normal pulses.     Heart sounds: Normal heart sounds. No murmur.  Pulmonary:     Effort: Pulmonary effort is normal. No respiratory distress.     Breath sounds:  Normal breath sounds. No wheezing, rhonchi or rales.     Comments: Lungs clear Lymphadenopathy:     Cervical: No cervical adenopathy.  Skin:    General: Skin is warm and dry.     Findings: No rash.  Neurological:     Mental Status: She is alert.       Assessment & Plan:   Problem List Items Addressed This Visit    Acute sinusitis - Primary    Anticipate R sided sinusitis, likely viral + allergic given short duration. Supportive care reviewed as per instructions. Provided with WASP for amoxicillin in case symptoms suggestive of bacterial infection develop.       Relevant Medications   amoxicillin (AMOXIL) 875 MG tablet   fluticasone (FLONASE) 50 MCG/ACT nasal spray       Meds ordered this encounter  Medications  . amoxicillin (AMOXIL) 875 MG tablet    Sig: Take 1 tablet (875 mg total) by mouth 2 (two) times daily.    Dispense:  20 tablet    Refill:  0  . fluticasone (FLONASE) 50 MCG/ACT nasal spray    Sig: Place 2 sprays into both nostrils daily.    Dispense:  16 g    Refill:  1   No orders of the defined types were placed in this encounter.   Patient Instructions  I do think you have sinus inflammation but likely allergy/viral cause at this time. Take medicine as prescribed: start flonase nasal steroid.  Take ibuprofen 400-600mg  with meals for next 3-5 days.  Push fluids and plenty of rest. Nasal saline irrigation or neti pot to help drain sinuses. May use tussin DM with plenty of fluid to help mobilize mucous. If fever, worsening facial pain on right, worsening productive cough, or ongoing past 8-10 days, fill antibiotic prescription provided.    Follow up plan: No follow-ups on file.  [name], MD

## 2018-10-06 NOTE — Patient Instructions (Signed)
I do think you have sinus inflammation but likely allergy/viral cause at this time. Take medicine as prescribed: start flonase nasal steroid.  Take ibuprofen 400-600mg  with meals for next 3-5 days.  Push fluids and plenty of rest. Nasal saline irrigation or neti pot to help drain sinuses. May use tussin DM with plenty of fluid to help mobilize mucous. If fever, worsening facial pain on right, worsening productive cough, or ongoing past 8-10 days, fill antibiotic prescription provided.

## 2018-10-11 ENCOUNTER — Ambulatory Visit: Payer: 59 | Admitting: Family Medicine

## 2018-11-01 ENCOUNTER — Encounter: Payer: Self-pay | Admitting: Family Medicine

## 2018-11-03 NOTE — Telephone Encounter (Signed)
Pt is scheduled for 02/02/19 @ 4:30pm

## 2018-11-03 NOTE — Telephone Encounter (Signed)
plz reschedule patient for 2-3 month f/u visit.

## 2018-11-04 ENCOUNTER — Telehealth: Payer: Self-pay | Admitting: Family Medicine

## 2018-11-04 NOTE — Telephone Encounter (Signed)
Patient called today requesting her blood pressure readings for the last year be faxed over to her OBGYN so she is able to continue her birth control   Dr Scarlette Ar [facility]  FAX- [phone]

## 2018-11-04 NOTE — Telephone Encounter (Signed)
Spoke with pt informing her we need a signed medical records release form signed before we can fax her info.  Pt verbalizes understanding and will come by our office tomorrow morning to sign. Did COVID 19 questions with pt and instructed her to park at back of office and I will come out to her car. Verbalizes understanding.   [Form is in basket on my desk.]

## 2018-11-08 ENCOUNTER — Ambulatory Visit: Payer: 59 | Admitting: Family Medicine

## 2018-11-08 NOTE — Telephone Encounter (Signed)
Pt signed Medical Release form.  Faxed recent BP readings to Dr. Garwin Brothers at [place] as requested per pt.   Placed release to be scanned.

## 2018-11-16 ENCOUNTER — Encounter: Payer: Self-pay | Admitting: Family Medicine

## 2018-12-22 DIAGNOSIS — Z6841 Body Mass Index (BMI) 40.0 and over, adult: Secondary | ICD-10-CM | POA: Diagnosis not present

## 2018-12-22 DIAGNOSIS — Z01419 Encounter for gynecological examination (general) (routine) without abnormal findings: Secondary | ICD-10-CM | POA: Diagnosis not present

## 2018-12-22 DIAGNOSIS — Z1151 Encounter for screening for human papillomavirus (HPV): Secondary | ICD-10-CM | POA: Diagnosis not present

## 2019-02-02 ENCOUNTER — Other Ambulatory Visit: Payer: Self-pay

## 2019-02-02 ENCOUNTER — Encounter: Payer: Self-pay | Admitting: Family Medicine

## 2019-02-02 ENCOUNTER — Ambulatory Visit: Payer: 59 | Admitting: Family Medicine

## 2019-02-02 DIAGNOSIS — E559 Vitamin D deficiency, unspecified: Secondary | ICD-10-CM

## 2019-02-02 DIAGNOSIS — E8881 Metabolic syndrome: Secondary | ICD-10-CM | POA: Diagnosis not present

## 2019-02-02 DIAGNOSIS — E7849 Other hyperlipidemia: Secondary | ICD-10-CM

## 2019-02-02 MED ORDER — METFORMIN HCL 500 MG PO TABS: 500.0000 mg | ORAL_TABLET | Freq: Every day | ORAL | 0 refills | Status: DC

## 2019-02-02 NOTE — Progress Notes (Signed)
This visit was conducted in person.  BP 130/82 (BP Location: Left Arm, Patient Position: Sitting, Cuff Size: Large)   Pulse 93   Temp 98.3 F (36.8 C) (Temporal)   Ht 5\' 3"  (1.6 m)   Wt 264 lb 2 oz (119.8 kg)   LMP 12/20/2018   SpO2 97%   BMI 46.79 kg/m    CC: 3 mo f/u visit Subjective:    Patient ID: Alisha Wood, female    DOB: October 05, 1985, 32 y.o.   MRN: [id number]  HPI: Alisha Wood is a 33 y.o. female presenting on 02/02/2019 for Hypertension (Here for 2-3 mo f/u.)   Seen by Rollene Fare 08/2018 with elevated BP readings associated with intermittent headaches - attributed to weight gain. BP better control with healthy diet changes and weight loss.   Obesity - 2 lb weight gain since last visit. Not significant stress with Covid pandemic. Watching more TV. Does have stationary bicycle. Cares for 68 yo son at home. Struggling with food choices, stays hungry.   Intolerance to wellbutrin in the past. Phentermine caused trouble with insomnia. Mother did not tolerate saxenda (nausea). Avoiding topamax as on OCP.   Goal is 1300 cal/day.   She has seen nutritionist.  Has had elevated insulin levels in the past.      Relevant past medical, surgical, family and social history reviewed and updated as indicated. Interim medical history since our last visit reviewed. Allergies and medications reviewed and updated. Outpatient Medications Prior to Visit  Medication Sig Dispense Refill  . levonorgestrel-ethinyl estradiol (SEASONALE,INTROVALE,JOLESSA) 0.15-0.03 MG tablet Take 1 tablet by mouth daily.    . fluticasone (FLONASE) 50 MCG/ACT nasal spray Place 2 sprays into both nostrils daily. 16 g 1   No facility-administered medications prior to visit.      Per HPI unless specifically indicated in ROS section below Review of Systems Objective:    BP 130/82 (BP Location: Left Arm, Patient Position: Sitting, Cuff Size: Large)   Pulse 93   Temp 98.3 F (36.8 C) (Temporal)   Ht 5'  3" (1.6 m)   Wt 264 lb 2 oz (119.8 kg)   LMP 12/20/2018   SpO2 97%   BMI 46.79 kg/m   Wt Readings from Last 3 Encounters:  02/02/19 264 lb 2 oz (119.8 kg)  10/06/18 261 lb 2 oz (118.4 kg)  09/02/18 272 lb (123.4 kg)    Physical Exam Vitals signs and nursing note reviewed.  Constitutional:      General: She is not in acute distress.    Appearance: Normal appearance. She is obese. She is not ill-appearing.  HENT:     Mouth/Throat:     Mouth: Mucous membranes are moist.     Pharynx: No posterior oropharyngeal erythema.  Cardiovascular:     Rate and Rhythm: Normal rate and regular rhythm.     Pulses: Normal pulses.     Heart sounds: Normal heart sounds. No murmur.  Pulmonary:     Effort: Pulmonary effort is normal. No respiratory distress.     Breath sounds: Normal breath sounds. No wheezing, rhonchi or rales.  Neurological:     Mental Status: She is alert.  Psychiatric:        Mood and Affect: Mood normal.        Behavior: Behavior normal.       Results for orders placed or performed in visit on 11/16/18  HM PAP SMEAR  Result Value Ref Range   HM Pap smear WNL  Assessment & Plan:   Problem List Items Addressed This Visit    Vitamin D deficiency   Relevant Orders   VITAMIN D 25 Hydroxy (Vit-D Deficiency, Fractures)   Severe obesity (BMI >= 40) (HCC) - Primary    Poor candidate for bariatric medication, does not want injectable saxenda at this time. Has had elevated insulin levels in the past - update tomorrow. Will start metformin 500mg  once daily after labs tomorrow. I asked her to look into [facility] weight loss program. Encouraged renewed effort at following regular exercise routine. Reviewed healthy diet and lifestyle changes to affect sustainable weight loss.       Relevant Medications   metFORMIN (GLUCOPHAGE) 500 MG tablet   Other hyperlipidemia   Relevant Orders   Comprehensive metabolic panel   Lipid panel   Insulin resistance   Relevant Orders   Insulin,  random       Meds ordered this encounter  Medications  . metFORMIN (GLUCOPHAGE) 500 MG tablet    Sig: Take 1 tablet (500 mg total) by mouth daily with breakfast.    Dispense:  30 tablet    Refill:  0   Orders Placed This Encounter  Procedures  . Comprehensive metabolic panel    Standing Status:   Future    Standing Expiration Date:   02/02/2020  . Lipid panel    Standing Status:   Future    Standing Expiration Date:   02/02/2020  . VITAMIN D 25 Hydroxy (Vit-D Deficiency, Fractures)    Standing Status:   Future    Standing Expiration Date:   02/02/2020  . Insulin, random    Standing Status:   Future    Standing Expiration Date:   02/02/2020    Follow up plan: Return in about 4 weeks (around 03/02/2019). for complete physical.   Alisha Bush, MD

## 2019-02-02 NOTE — Assessment & Plan Note (Signed)
Poor candidate for bariatric medication, does not want injectable saxenda at this time. Has had elevated insulin levels in the past - update tomorrow. Will start metformin 500mg  once daily after labs tomorrow. I asked her to look into [facility] weight loss program. Encouraged renewed effort at following regular exercise routine. Reviewed healthy diet and lifestyle changes to affect sustainable weight loss.

## 2019-02-02 NOTE — Patient Instructions (Addendum)
[employer] online.  Work on regular exercise routine, try to get out to park for walking.  Return at your convenience for fasting labs.  Start metformin 500mg  once daily for the next month. Return in 1 month for physical.

## 2019-02-03 ENCOUNTER — Other Ambulatory Visit (INDEPENDENT_AMBULATORY_CARE_PROVIDER_SITE_OTHER): Payer: 59

## 2019-02-03 DIAGNOSIS — E7849 Other hyperlipidemia: Secondary | ICD-10-CM

## 2019-02-03 DIAGNOSIS — E8881 Metabolic syndrome: Secondary | ICD-10-CM | POA: Diagnosis not present

## 2019-02-03 DIAGNOSIS — E559 Vitamin D deficiency, unspecified: Secondary | ICD-10-CM

## 2019-02-03 LAB — COMPREHENSIVE METABOLIC PANEL
ALT: 19 U/L (ref 0–35)
AST: 13 U/L (ref 0–37)
Albumin: 3.9 g/dL (ref 3.5–5.2)
Alkaline Phosphatase: 52 U/L (ref 39–117)
BUN: 13 mg/dL (ref 6–23)
CO2: 21 mEq/L (ref 19–32)
Calcium: 8.6 mg/dL (ref 8.4–10.5)
Chloride: 109 mEq/L (ref 96–112)
Creatinine, Ser: 0.62 mg/dL (ref 0.40–1.20)
GFR: 110.24 mL/min (ref >60.00)
Glucose, Bld: 94 mg/dL (ref 70–99)
Potassium: 4.3 mEq/L (ref 3.5–5.1)
Sodium: 138 mEq/L (ref 135–145)
Total Bilirubin: 0.3 mg/dL (ref 0.2–1.2)
Total Protein: 6.5 g/dL (ref 6.0–8.3)

## 2019-02-03 LAB — LIPID PANEL
Cholesterol: 180 mg/dL (ref 0–200)
HDL: 35.3 mg/dL — ABNORMAL LOW (ref >39.00)
LDL Cholesterol: 125 mg/dL — ABNORMAL HIGH (ref 0–99)
NonHDL: 144.93
Total CHOL/HDL Ratio: 5
Triglycerides: 102 mg/dL (ref 0.0–149.0)
VLDL: 20.4 mg/dL (ref 0.0–40.0)

## 2019-02-03 LAB — VITAMIN D 25 HYDROXY (VIT D DEFICIENCY, FRACTURES): VITD: 16.36 ng/mL — ABNORMAL LOW (ref 30.00–100.00)

## 2019-02-04 LAB — INSULIN, RANDOM: Insulin: 32 u[IU]/mL — ABNORMAL HIGH

## 2019-02-05 ENCOUNTER — Encounter: Payer: Self-pay | Admitting: Family Medicine

## 2019-02-05 ENCOUNTER — Other Ambulatory Visit: Payer: Self-pay | Admitting: Family Medicine

## 2019-02-05 MED ORDER — VITAMIN D 50 MCG (2000 UT) PO CAPS: 1.0000 | ORAL_CAPSULE | Freq: Every day | ORAL | Status: DC

## 2019-03-11 ENCOUNTER — Other Ambulatory Visit: Payer: Self-pay

## 2019-03-11 ENCOUNTER — Ambulatory Visit (INDEPENDENT_AMBULATORY_CARE_PROVIDER_SITE_OTHER): Payer: 59 | Admitting: Family Medicine

## 2019-03-11 ENCOUNTER — Encounter: Payer: Self-pay | Admitting: Family Medicine

## 2019-03-11 VITALS — BP 118/84 | HR 92 | Temp 97.9°F | Ht 62.25 in | Wt 260.1 lb

## 2019-03-11 DIAGNOSIS — E785 Hyperlipidemia, unspecified: Secondary | ICD-10-CM | POA: Diagnosis not present

## 2019-03-11 DIAGNOSIS — E538 Deficiency of other specified B group vitamins: Secondary | ICD-10-CM | POA: Diagnosis not present

## 2019-03-11 DIAGNOSIS — K760 Fatty (change of) liver, not elsewhere classified: Secondary | ICD-10-CM | POA: Diagnosis not present

## 2019-03-11 DIAGNOSIS — E8881 Metabolic syndrome: Secondary | ICD-10-CM

## 2019-03-11 DIAGNOSIS — Z0000 Encounter for general adult medical examination without abnormal findings: Secondary | ICD-10-CM

## 2019-03-11 DIAGNOSIS — E559 Vitamin D deficiency, unspecified: Secondary | ICD-10-CM | POA: Diagnosis not present

## 2019-03-11 MED ORDER — METFORMIN HCL 500 MG PO TABS: 500.0000 mg | ORAL_TABLET | Freq: Every day | ORAL | 1 refills | Status: DC

## 2019-03-11 MED ORDER — CETIRIZINE HCL 10 MG PO TABS: 10.0000 mg | ORAL_TABLET | Freq: Every day | ORAL | 11 refills | Status: DC

## 2019-03-11 NOTE — Patient Instructions (Addendum)
Flu shot at work.  You are doing well today. Return as needed or in 3 months for follow up visit.   Health Maintenance, Female Adopting a healthy lifestyle and getting preventive care are important in promoting health and wellness. Ask your health care provider about:  The right schedule for you to have regular tests and exams.  Things you can do on your own to prevent diseases and keep yourself healthy. What should I know about diet, weight, and exercise? Eat a healthy diet   Eat a diet that includes plenty of vegetables, fruits, low-fat dairy products, and lean protein.  Do not eat a lot of foods that are high in solid fats, added sugars, or sodium. Maintain a healthy weight Body mass index (BMI) is used to identify weight problems. It estimates body fat based on height and weight. Your health care provider can help determine your BMI and help you achieve or maintain a healthy weight. Get regular exercise Get regular exercise. This is one of the most important things you can do for your health. Most adults should:  Exercise for at least 150 minutes each week. The exercise should increase your heart rate and make you sweat (moderate-intensity exercise).  Do strengthening exercises at least twice a week. This is in addition to the moderate-intensity exercise.  Spend less time sitting. Even light physical activity can be beneficial. Watch cholesterol and blood lipids Have your blood tested for lipids and cholesterol at 34 years of age, then have this test every 5 years. Have your cholesterol levels checked more often if:  Your lipid or cholesterol levels are high.  You are older than 34 years of age.  You are at high risk for heart disease. What should I know about cancer screening? Depending on your health history and family history, you may need to have cancer screening at various ages. This may include screening for:  Breast cancer.  Cervical cancer.  Colorectal  cancer.  Skin cancer.  Lung cancer. What should I know about heart disease, diabetes, and high blood pressure? Blood pressure and heart disease  High blood pressure causes heart disease and increases the risk of stroke. This is more likely to develop in people who have high blood pressure readings, are of African descent, or are overweight.  Have your blood pressure checked: ? Every 3-5 years if you are 53-59 years of age. ? Every year if you are 5 years old or older. Diabetes Have regular diabetes screenings. This checks your fasting blood sugar level. Have the screening done:  Once every three years after age 40 if you are at a normal weight and have a low risk for diabetes.  More often and at a younger age if you are overweight or have a high risk for diabetes. What should I know about preventing infection? Hepatitis B If you have a higher risk for hepatitis B, you should be screened for this virus. Talk with your health care provider to find out if you are at risk for hepatitis B infection. Hepatitis C Testing is recommended for:  Everyone born from [birth year] through 1966.  Anyone with known risk factors for hepatitis C. Sexually transmitted infections (STIs)  Get screened for STIs, including gonorrhea and chlamydia, if: ? You are sexually active and are younger than 34 years of age. ? You are older than 34 years of age and your health care provider tells you that you are at risk for this type of infection. ? Your sexual activity  has changed since you were last screened, and you are at increased risk for chlamydia or gonorrhea. Ask your health care provider if you are at risk.  Ask your health care provider about whether you are at high risk for HIV. Your health care provider may recommend a prescription medicine to help prevent HIV infection. If you choose to take medicine to prevent HIV, you should first get tested for HIV. You should then be tested every 3 months for as long as  you are taking the medicine. Pregnancy  If you are about to stop having your period (premenopausal) and you may become pregnant, seek counseling before you get pregnant.  Take 400 to 800 micrograms (mcg) of folic acid every day if you become pregnant.  Ask for birth control (contraception) if you want to prevent pregnancy. Osteoporosis and menopause Osteoporosis is a disease in which the bones lose minerals and strength with aging. This can result in bone fractures. If you are 60 years old or older, or if you are at risk for osteoporosis and fractures, ask your health care provider if you should:  Be screened for bone loss.  Take a calcium or vitamin D supplement to lower your risk of fractures.  Be given hormone replacement therapy (HRT) to treat symptoms of menopause. Follow these instructions at home: Lifestyle  Do not use any products that contain nicotine or tobacco, such as cigarettes, e-cigarettes, and chewing tobacco. If you need help quitting, ask your health care provider.  Do not use street drugs.  Do not share needles.  Ask your health care provider for help if you need support or information about quitting drugs. Alcohol use  Do not drink alcohol if: ? Your health care provider tells you not to drink. ? You are pregnant, may be pregnant, or are planning to become pregnant.  If you drink alcohol: ? Limit how much you use to 0-1 drink a day. ? Limit intake if you are breastfeeding.  Be aware of how much alcohol is in your drink. In the [place] one drink equals one 12 oz bottle of beer (355 mL), one 5 oz glass of wine (148 mL), or one 1 oz glass of hard liquor (44 mL). General instructions  Schedule regular health, dental, and eye exams.  Stay current with your vaccines.  Tell your health care provider if: ? You often feel depressed. ? You have ever been abused or do not feel safe at home. Summary  Adopting a healthy lifestyle and getting preventive care are  important in promoting health and wellness.  Follow your health care provider's instructions about healthy diet, exercising, and getting tested or screened for diseases.  Follow your health care provider's instructions on monitoring your cholesterol and blood pressure. This information is not intended to replace advice given to you by your health care provider. Make sure you discuss any questions you have with your health care provider. Document Released: 01/20/2011 Document Revised: 06/30/2018 Document Reviewed: 06/30/2018 Elsevier Patient Education  2020 [employer].

## 2019-03-11 NOTE — Assessment & Plan Note (Addendum)
Congratulated on noted weight loss. Pt motivated for ongoing healthy diet choices. Following The [employer]'s Table biblical eating plan. Would consider Noom. RTC 3 mo f/u visit.

## 2019-03-11 NOTE — Assessment & Plan Note (Signed)
Preventative protocols reviewed and updated unless pt declined. Discussed healthy diet and lifestyle.  

## 2019-03-11 NOTE — Assessment & Plan Note (Signed)
She needs to restart vit D 2000 IU daily.

## 2019-03-11 NOTE — Assessment & Plan Note (Signed)
LFTs normal

## 2019-03-11 NOTE — Assessment & Plan Note (Signed)
Tolerating metformin - will continue. Insulin levels elevated last month.

## 2019-03-11 NOTE — Assessment & Plan Note (Signed)
Will need b12 replacement as well as updated levels next labs.

## 2019-03-11 NOTE — Assessment & Plan Note (Signed)
Low HDL, LDL elevated. Encouraged healthy diet choices.

## 2019-03-11 NOTE — Progress Notes (Signed)
This visit was conducted in person.  BP 118/84 (BP Location: Left Arm, Patient Position: Sitting, Cuff Size: Large)   Pulse 92   Temp 97.9 F (36.6 C) (Temporal)   Ht 5' 2.25" (1.581 m)   Wt 260 lb 1 oz (118 kg)   LMP 02/18/2019   SpO2 98%   BMI 47.18 kg/m    CC: CPE  Subjective:    Patient ID: Alisha Wood, female    DOB: 1986/03/03, 33 y.o.   MRN: [id number]  HPI: Alisha Wood is a 33 y.o. female presenting on 03/11/2019 for Annual Exam   Not eating out as much as prior.  Following The Lord[employer] Table.  Goal is 1300 cal/day, some days down to 650cal/day.   Intolerance to wellbutrin in the past. Phentermine caused trouble with insomnia. Mother did not tolerate saxenda (nausea). Avoiding topamax as on OCP.   She has seen nutritionist.  Has had elevated insulin levels in the past.  Preventative: Well woman exam yearly at [facility] with normal pap 09/2018 Flu shot yearly Tdap 2016  MMR completed (2 vaccines) Seat belt use discussed Sunscreen use discussed. No changing moles on skin.  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 Interim medical history since our last visit reviewed. Allergies and medications reviewed and updated. Outpatient Medications Prior to Visit  Medication Sig Dispense Refill  . Cholecalciferol (VITAMIN D) 50 MCG (2000 UT) CAPS Take 1 capsule (2,000 Units total) by mouth daily. 30 capsule   . levonorgestrel-ethinyl estradiol (SEASONALE,INTROVALE,JOLESSA) 0.15-0.03 MG tablet Take 1 tablet by mouth daily.    . metFORMIN (GLUCOPHAGE) 500  MG tablet Take 1 tablet (500 mg total) by mouth daily with breakfast. 30 tablet 0   No facility-administered medications prior to visit.      Per HPI unless specifically indicated in ROS section below Review of Systems  Constitutional: Negative for activity change, appetite change, chills, fatigue, fever and unexpected weight change.  HENT: Negative for hearing loss.   Eyes: Negative for visual disturbance.  Respiratory: Negative for cough, chest tightness, shortness of breath and wheezing.   Cardiovascular: Negative for chest pain, palpitations and leg swelling.  Gastrointestinal: Negative for abdominal distention, abdominal pain, blood in stool, constipation, diarrhea, nausea and vomiting.  Genitourinary: Negative for difficulty urinating and hematuria.  Musculoskeletal: Negative for arthralgias, myalgias and neck pain.  Skin: Negative for rash.  Neurological: Negative for dizziness, seizures, syncope and headaches.  Hematological: Negative for adenopathy. Does not bruise/bleed easily.  Psychiatric/Behavioral: Negative for dysphoric mood. The patient is not nervous/anxious.    Objective:    BP 118/84 (BP Location: Left Arm, Patient Position: Sitting, Cuff Size: Large)   Pulse 92   Temp 97.9 F (36.6 C) (Temporal)   Ht 5' 2.25" (1.581 m)   Wt 260 lb 1 oz (118 kg)   LMP 02/18/2019   SpO2 98%   BMI 47.18 kg/m   Wt Readings from Last 3 Encounters:  03/11/19 260 lb 1 oz (118 kg)  02/02/19 264 lb 2 oz (119.8 kg)  10/06/18 261  lb 2 oz (118.4 kg)    Physical Exam Vitals signs and nursing note reviewed.  Constitutional:      General: She is not in acute distress.    Appearance: Normal appearance. She is well-developed. She is obese. She is not ill-appearing.  HENT:     Head: Normocephalic and atraumatic.     Right Ear: Hearing, tympanic membrane, ear canal and external ear normal.     Left Ear: Hearing, tympanic membrane, ear canal and external ear normal.     Nose: Nose  normal.     Mouth/Throat:     Mouth: Mucous membranes are moist.     Pharynx: Uvula midline. No oropharyngeal exudate or posterior oropharyngeal erythema.  Eyes:     General: No scleral icterus.    Conjunctiva/sclera: Conjunctivae normal.     Pupils: Pupils are equal, round, and reactive to light.  Neck:     Musculoskeletal: Normal range of motion and neck supple.  Cardiovascular:     Rate and Rhythm: Normal rate and regular rhythm.     Pulses: Normal pulses.          Radial pulses are 2+ on the right side and 2+ on the left side.     Heart sounds: Normal heart sounds. No murmur.  Pulmonary:     Effort: Pulmonary effort is normal. No respiratory distress.     Breath sounds: Normal breath sounds. No wheezing, rhonchi or rales.  Abdominal:     General: Abdomen is flat. Bowel sounds are normal. There is no distension.     Palpations: Abdomen is soft. There is no mass.     Tenderness: There is no abdominal tenderness. There is no guarding or rebound.     Hernia: No hernia is present.  Musculoskeletal: Normal range of motion.  Lymphadenopathy:     Cervical: No cervical adenopathy.  Skin:    General: Skin is warm and dry.     Findings: No rash.  Neurological:     General: No focal deficit present.     Mental Status: She is alert and oriented to person, place, and time.     Comments: CN grossly intact, station and gait intact  Psychiatric:        Mood and Affect: Mood normal.        Behavior: Behavior normal.        Thought Content: Thought content normal.        Judgment: Judgment normal.       Results for orders placed or performed in visit on 02/03/19  Insulin, random  Result Value Ref Range   Insulin 32.0 (H) uIU/mL  VITAMIN D 25 Hydroxy (Vit-D Deficiency, Fractures)  Result Value Ref Range   VITD 16.36 (L) 30.00 - 100.00 ng/mL  Lipid panel  Result Value Ref Range   Cholesterol 180 0 - 200 mg/dL   Triglycerides 102.0 0.0 - 149.0 mg/dL   HDL 35.30 (L) >39.00 mg/dL    VLDL 20.4 0.0 - 40.0 mg/dL   LDL Cholesterol 125 (H) 0 - 99 mg/dL   Total CHOL/HDL Ratio 5    NonHDL 144.93   Comprehensive metabolic panel  Result Value Ref Range   Sodium 138 135 - 145 mEq/L   Potassium 4.3 3.5 - 5.1 mEq/L   Chloride 109 96 - 112 mEq/L   CO2 21 19 - 32 mEq/L   Glucose, Bld 94 70 - 99 mg/dL   BUN 13 6 - 23 mg/dL   Creatinine, Ser 0.62 0.40 -  1.20 mg/dL   Total Bilirubin 0.3 0.2 - 1.2 mg/dL   Alkaline Phosphatase 52 39 - 117 U/L   AST 13 0 - 37 U/L   ALT 19 0 - 35 U/L   Total Protein 6.5 6.0 - 8.3 g/dL   Albumin 3.9 3.5 - 5.2 g/dL   Calcium 8.6 8.4 - 10.5 mg/dL   GFR 110.24 >60.00 mL/min   Lab Results  Component Value Date   VITAMINB12 241 01/13/2018    Assessment & Plan:   Problem List Items Addressed This Visit    Vitamin D deficiency    She needs to restart vit D 2000 IU daily.       Vitamin B12 deficiency    Will need b12 replacement as well as updated levels next labs.       Severe obesity (BMI >= 40) (HCC)    Congratulated on noted weight loss. Pt motivated for ongoing healthy diet choices. Following The Lord's Table biblical eating plan. Would consider Noom. RTC 3 mo f/u visit.       Relevant Medications   metFORMIN (GLUCOPHAGE) 500 MG tablet   NAFLD (nonalcoholic fatty liver disease)    LFTs normal.       Insulin resistance    Tolerating metformin - will continue. Insulin levels elevated last month.       HLD (hyperlipidemia)    Low HDL, LDL elevated. Encouraged healthy diet choices.       Health maintenance examination - Primary    Preventative protocols reviewed and updated unless pt declined. Discussed healthy diet and lifestyle.           Meds ordered this encounter  Medications  . metFORMIN (GLUCOPHAGE) 500 MG tablet    Sig: Take 1 tablet (500 mg total) by mouth daily with breakfast.    Dispense:  90 tablet    Refill:  1  . cetirizine (ZYRTEC) 10 MG tablet    Sig: Take 1 tablet (10 mg total) by mouth daily.     Dispense:  30 tablet    Refill:  11   No orders of the defined types were placed in this encounter.   Follow up plan: Return in about 3 months (around 06/11/2019) for follow up visit.  [name], MD

## 2019-03-18 ENCOUNTER — Encounter: Payer: 59 | Admitting: Family Medicine

## 2019-03-23 ENCOUNTER — Encounter: Payer: Self-pay | Admitting: Family Medicine

## 2019-03-24 NOTE — Telephone Encounter (Signed)
See other mychart message.

## 2019-03-25 ENCOUNTER — Encounter (HOSPITAL_COMMUNITY): Payer: Self-pay | Admitting: Emergency Medicine

## 2019-03-25 ENCOUNTER — Telehealth: Payer: Self-pay

## 2019-03-25 ENCOUNTER — Other Ambulatory Visit: Payer: Self-pay

## 2019-03-25 ENCOUNTER — Ambulatory Visit (HOSPITAL_COMMUNITY)
Admission: EM | Admit: 2019-03-25 | Discharge: 2019-03-25 | Disposition: A | Discharge status: Home / Self Care | Payer: 59 | Attending: Family Medicine | Admitting: Family Medicine

## 2019-03-25 DIAGNOSIS — R55 Syncope and collapse: Secondary | ICD-10-CM | POA: Insufficient documentation

## 2019-03-25 DIAGNOSIS — R519 Headache, unspecified: Secondary | ICD-10-CM

## 2019-03-25 DIAGNOSIS — R51 Headache: Secondary | ICD-10-CM | POA: Insufficient documentation

## 2019-03-25 LAB — CBC
HCT: 45.7 % (ref 36.0–46.0)
Hemoglobin: 15.2 g/dL — ABNORMAL HIGH (ref 12.0–15.0)
MCH: 31.1 pg (ref 26.0–34.0)
MCHC: 33.3 g/dL (ref 30.0–36.0)
MCV: 93.6 fL (ref 80.0–100.0)
Platelets: 244 10*3/uL (ref 150–400)
RBC: 4.88 MIL/uL (ref 3.87–5.11)
RDW: 13.2 % (ref 11.5–15.5)
WBC: 7.9 10*3/uL (ref 4.0–10.5)
nRBC: 0 % (ref 0.0–0.2)

## 2019-03-25 LAB — COMPREHENSIVE METABOLIC PANEL
ALT: 88 U/L — ABNORMAL HIGH (ref 0–44)
AST: 45 U/L — ABNORMAL HIGH (ref 15–41)
Albumin: 4.4 g/dL (ref 3.5–5.0)
Alkaline Phosphatase: 58 U/L (ref 38–126)
Anion gap: 10 (ref 5–15)
BUN: 13 mg/dL (ref 6–20)
CO2: 22 mmol/L (ref 22–32)
Calcium: 9.8 mg/dL (ref 8.9–10.3)
Chloride: 106 mmol/L (ref 98–111)
Creatinine, Ser: 0.8 mg/dL (ref 0.44–1.00)
GFR calc Af Amer: >60 mL/min (ref >60)
GFR calc non Af Amer: >60 mL/min (ref >60)
Glucose, Bld: 96 mg/dL (ref 70–99)
Potassium: 3.8 mmol/L (ref 3.5–5.1)
Sodium: 138 mmol/L (ref 135–145)
Total Bilirubin: 0.8 mg/dL (ref 0.3–1.2)
Total Protein: 8.1 g/dL (ref 6.5–8.1)

## 2019-03-25 LAB — TSH: TSH: 1.159 u[IU]/mL (ref 0.350–4.500)

## 2019-03-25 NOTE — Telephone Encounter (Signed)
Pt felt OK this morning and then developed the lightheadedness and feeling woozy, eyes feel tired,no blurred vision bu difficulty focusing. Does not have h/a but does have pressure feeling in head.No CP, SOB or skipping heart beat. No available appts at [facility] and pt will go to [facility].Pt has no covid symptoms, no travel and no known exposure to + covid. FYI to Dr Darnell Level.

## 2019-03-25 NOTE — Telephone Encounter (Signed)
Noted. Seen at [place]. plz call to schedule OV for next week.

## 2019-03-25 NOTE — ED Triage Notes (Signed)
Pt c/o dizziness since Feb, has been seen by her PCP several times for same, intitally thought it was blood pressure, but it was never high enough to put her on medicine.

## 2019-03-25 NOTE — Discharge Instructions (Addendum)
Call today to set up an eye appointment Follow up with your PCP I will call you with your test results
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
## 2019-03-25 NOTE — Telephone Encounter (Signed)
Pt scheduled on 04/01/19 at 12:00.

## 2019-03-26 ENCOUNTER — Encounter: Payer: Self-pay | Admitting: Family Medicine

## 2019-03-27 ENCOUNTER — Other Ambulatory Visit: Payer: Self-pay

## 2019-03-27 ENCOUNTER — Emergency Department (HOSPITAL_COMMUNITY)
Admission: EM | Admit: 2019-03-27 | Discharge: 2019-03-28 | Disposition: A | Discharge status: Home / Self Care | Payer: 59 | Attending: Emergency Medicine | Admitting: Emergency Medicine

## 2019-03-27 ENCOUNTER — Emergency Department (HOSPITAL_COMMUNITY): Payer: 59

## 2019-03-27 ENCOUNTER — Encounter (HOSPITAL_COMMUNITY): Payer: Self-pay | Admitting: Emergency Medicine

## 2019-03-27 DIAGNOSIS — R55 Syncope and collapse: Secondary | ICD-10-CM | POA: Diagnosis not present

## 2019-03-27 DIAGNOSIS — G43909 Migraine, unspecified, not intractable, without status migrainosus: Secondary | ICD-10-CM | POA: Diagnosis not present

## 2019-03-27 DIAGNOSIS — R519 Headache, unspecified: Secondary | ICD-10-CM

## 2019-03-27 DIAGNOSIS — Z79899 Other long term (current) drug therapy: Secondary | ICD-10-CM | POA: Diagnosis not present

## 2019-03-27 DIAGNOSIS — R002 Palpitations: Secondary | ICD-10-CM | POA: Insufficient documentation

## 2019-03-27 DIAGNOSIS — R51 Headache: Secondary | ICD-10-CM | POA: Diagnosis not present

## 2019-03-27 DIAGNOSIS — R42 Dizziness and giddiness: Secondary | ICD-10-CM | POA: Diagnosis not present

## 2019-03-27 IMAGING — CR DG CHEST 2V
2 series · 2 of 2 positions shown · non-contrast
Comparison: [DATE]

CLINICAL DATA: Dizziness

EXAM:
CHEST - 2 VIEW

[chest pa]
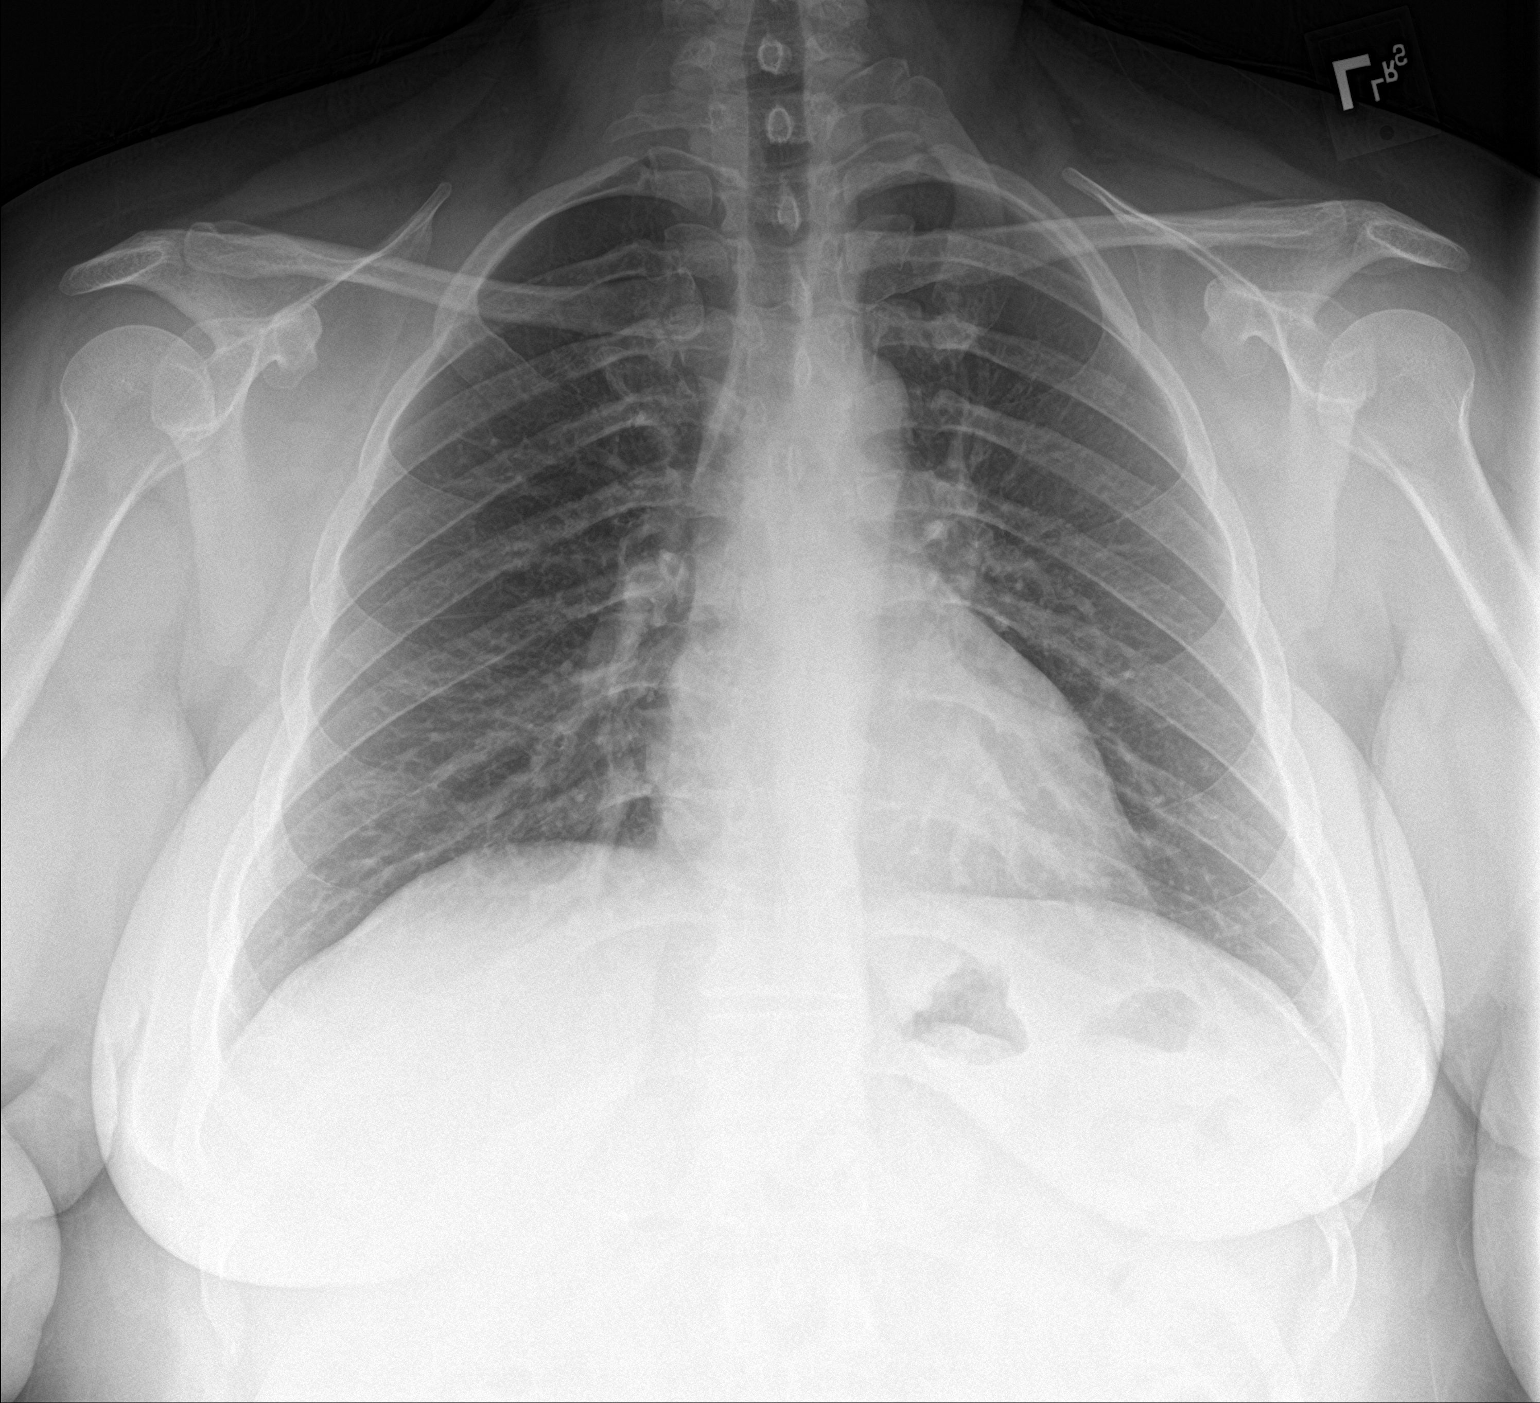

[chest lat]
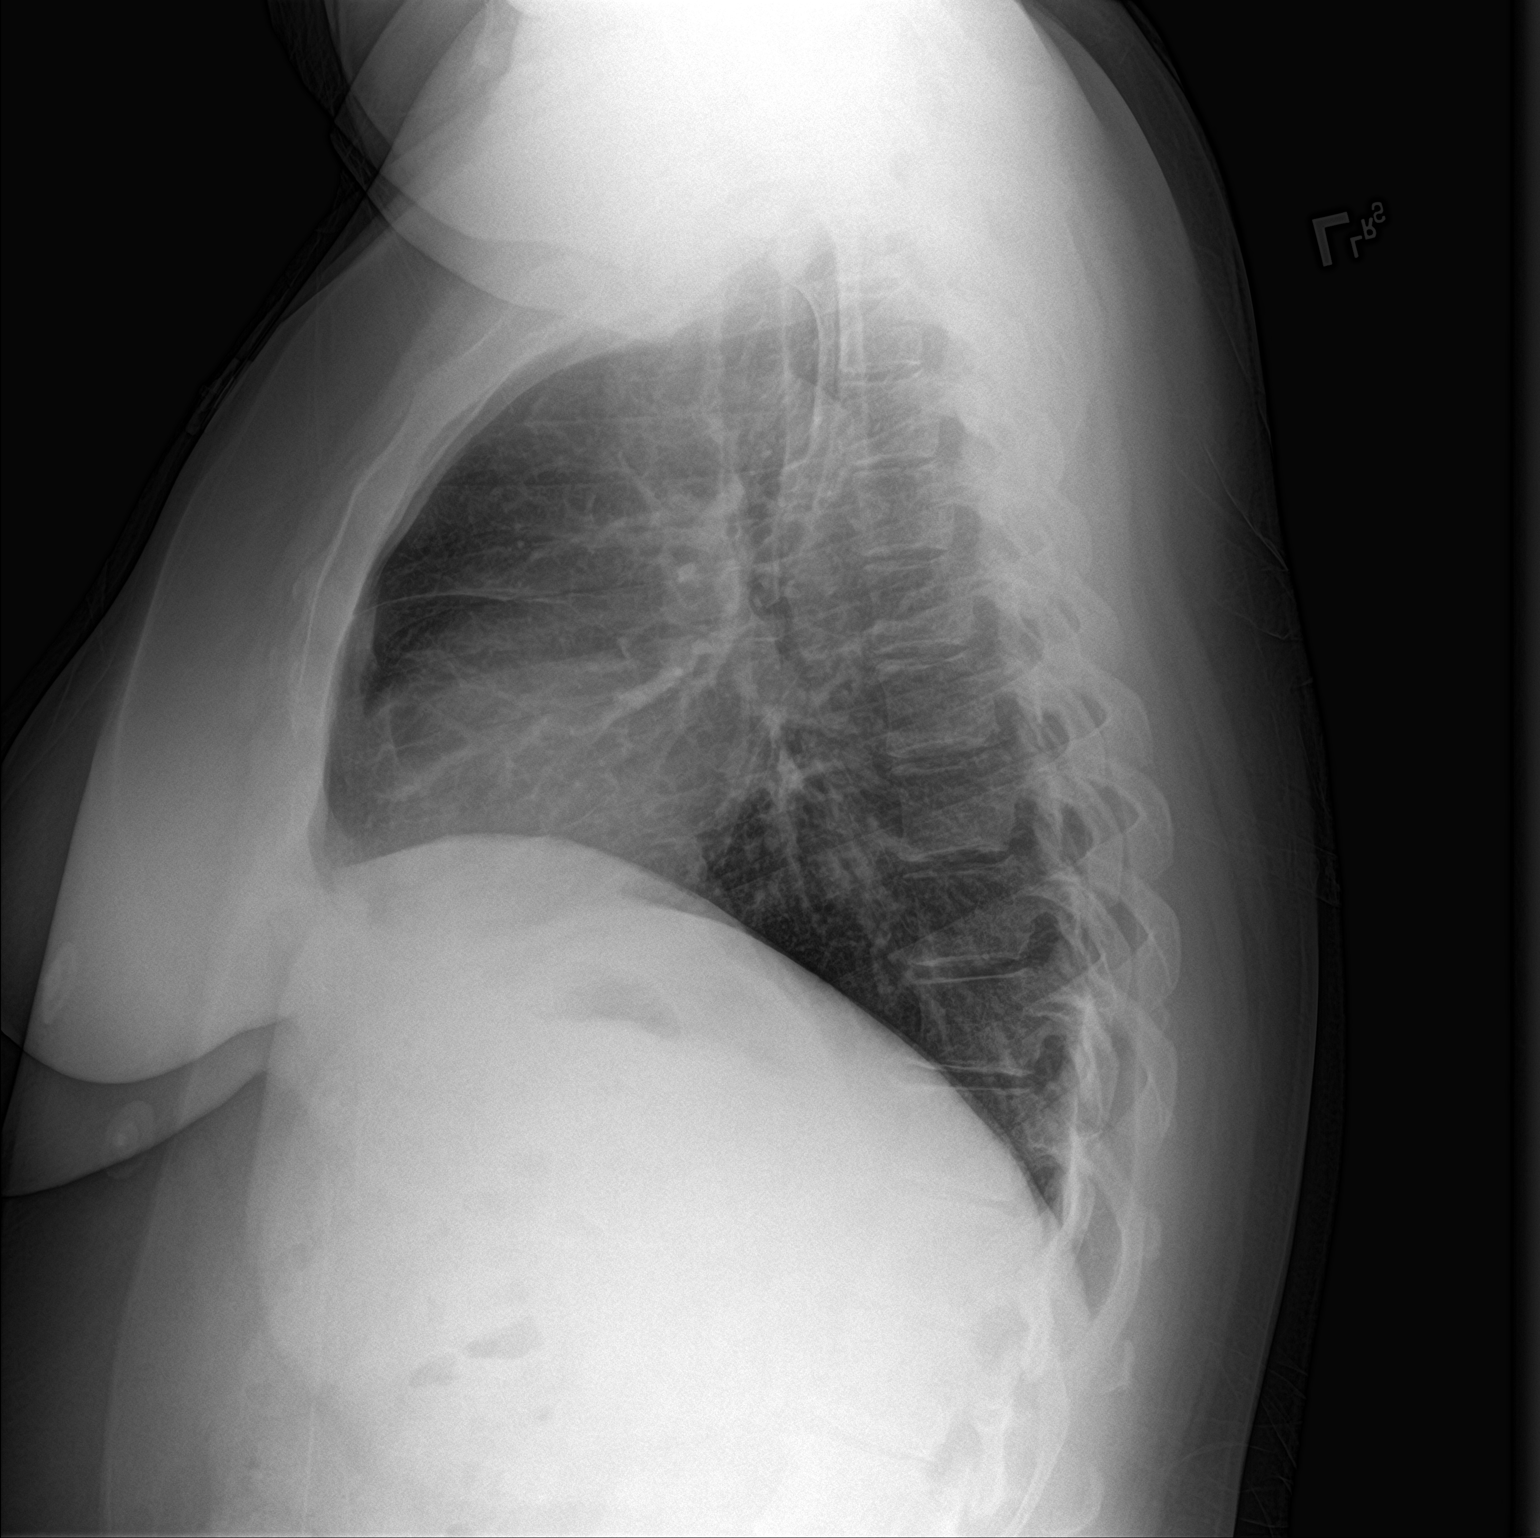

[2 of 2 positions shown; findings below may reference images not displayed]

FINDINGS: Heart and mediastinal contours are within normal limits. No focal
opacities or effusions. No acute bony abnormality.
IMPRESSION: No active cardiopulmonary disease.

## 2019-03-27 MED ORDER — SODIUM CHLORIDE 0.9% FLUSH: 3.0000 mL | Freq: Once | INTRAVENOUS | Status: DC

## 2019-03-27 NOTE — ED Notes (Signed)
2 Techs attempted lab draw x 3.  Pt to go to x-ray and then return to triage for labs.

## 2019-03-27 NOTE — ED Triage Notes (Signed)
C/o intermittent dizziness since February.  Reports left sided chest pressure and SOB that started today.  Denies nausea/vomiting.

## 2019-03-28 ENCOUNTER — Encounter: Payer: Self-pay | Admitting: Family Medicine

## 2019-03-28 ENCOUNTER — Emergency Department (HOSPITAL_COMMUNITY): Payer: 59

## 2019-03-28 DIAGNOSIS — R55 Syncope and collapse: Secondary | ICD-10-CM | POA: Diagnosis not present

## 2019-03-28 DIAGNOSIS — G43909 Migraine, unspecified, not intractable, without status migrainosus: Secondary | ICD-10-CM | POA: Diagnosis not present

## 2019-03-28 DIAGNOSIS — Z79899 Other long term (current) drug therapy: Secondary | ICD-10-CM | POA: Diagnosis not present

## 2019-03-28 DIAGNOSIS — R42 Dizziness and giddiness: Secondary | ICD-10-CM | POA: Diagnosis not present

## 2019-03-28 DIAGNOSIS — R51 Headache: Secondary | ICD-10-CM | POA: Diagnosis not present

## 2019-03-28 DIAGNOSIS — R002 Palpitations: Secondary | ICD-10-CM | POA: Diagnosis not present

## 2019-03-28 LAB — HEPATIC FUNCTION PANEL
ALT: 110 U/L — ABNORMAL HIGH (ref 0–44)
AST: 63 U/L — ABNORMAL HIGH (ref 15–41)
Albumin: 4.1 g/dL (ref 3.5–5.0)
Alkaline Phosphatase: 52 U/L (ref 38–126)
Bilirubin, Direct: 0.2 mg/dL (ref 0.0–0.2)
Indirect Bilirubin: 0.3 mg/dL (ref 0.3–0.9)
Total Bilirubin: 0.5 mg/dL (ref 0.3–1.2)
Total Protein: 7.2 g/dL (ref 6.5–8.1)

## 2019-03-28 LAB — CBC WITH DIFFERENTIAL/PLATELET
Abs Immature Granulocytes: 0.07 10*3/uL (ref 0.00–0.07)
Basophils Absolute: 0.1 10*3/uL (ref 0.0–0.1)
Basophils Relative: 1 %
Eosinophils Absolute: 0.2 10*3/uL (ref 0.0–0.5)
Eosinophils Relative: 2 %
HCT: 34.2 % — ABNORMAL LOW (ref 36.0–46.0)
Hemoglobin: 12.3 g/dL (ref 12.0–15.0)
Immature Granulocytes: 1 %
Lymphocytes Relative: 28 %
Lymphs Abs: 2.9 10*3/uL (ref 0.7–4.0)
MCH: 31.9 pg (ref 26.0–34.0)
MCHC: 36 g/dL (ref 30.0–36.0)
MCV: 88.6 fL (ref 80.0–100.0)
Monocytes Absolute: 0.9 10*3/uL (ref 0.1–1.0)
Monocytes Relative: 8 %
Neutro Abs: 6.2 10*3/uL (ref 1.7–7.7)
Neutrophils Relative %: 60 %
Platelets: 275 10*3/uL (ref 150–400)
RBC: 3.86 MIL/uL — ABNORMAL LOW (ref 3.87–5.11)
RDW: 13.3 % (ref 11.5–15.5)
WBC: 10.3 10*3/uL (ref 4.0–10.5)
nRBC: 0 % (ref 0.0–0.2)

## 2019-03-28 LAB — CBC
HCT: 44.4 % (ref 36.0–46.0)
Hemoglobin: 15 g/dL (ref 12.0–15.0)
MCH: 30.9 pg (ref 26.0–34.0)
MCHC: 33.8 g/dL (ref 30.0–36.0)
MCV: 91.5 fL (ref 80.0–100.0)
Platelets: 220 10*3/uL (ref 150–400)
RBC: 4.85 MIL/uL (ref 3.87–5.11)
RDW: 13.2 % (ref 11.5–15.5)
WBC: 9.4 10*3/uL (ref 4.0–10.5)
nRBC: 0 % (ref 0.0–0.2)

## 2019-03-28 LAB — BASIC METABOLIC PANEL
Anion gap: 12 (ref 5–15)
BUN: 11 mg/dL (ref 6–20)
CO2: 22 mmol/L (ref 22–32)
Calcium: 9.5 mg/dL (ref 8.9–10.3)
Chloride: 103 mmol/L (ref 98–111)
Creatinine, Ser: 0.74 mg/dL (ref 0.44–1.00)
GFR calc Af Amer: >60 mL/min (ref >60)
GFR calc non Af Amer: >60 mL/min (ref >60)
Glucose, Bld: 86 mg/dL (ref 70–99)
Potassium: 3.7 mmol/L (ref 3.5–5.1)
Sodium: 137 mmol/L (ref 135–145)

## 2019-03-28 LAB — RAPID URINE DRUG SCREEN, HOSP PERFORMED
Amphetamines: NOT DETECTED
Barbiturates: NOT DETECTED
Benzodiazepines: NOT DETECTED
Cocaine: NOT DETECTED
Opiates: NOT DETECTED
Tetrahydrocannabinol: NOT DETECTED

## 2019-03-28 LAB — I-STAT BETA HCG BLOOD, ED (MC, WL, AP ONLY): I-stat hCG, quantitative: <5 m[IU]/mL (ref <5)

## 2019-03-28 LAB — I-STAT CHEM 8, ED
BUN: 12 mg/dL (ref 6–20)
Calcium, Ion: 1.12 mmol/L — ABNORMAL LOW (ref 1.15–1.40)
Chloride: 106 mmol/L (ref 98–111)
Creatinine, Ser: 0.7 mg/dL (ref 0.44–1.00)
Glucose, Bld: 97 mg/dL (ref 70–99)
HCT: 44 % (ref 36.0–46.0)
Hemoglobin: 15 g/dL (ref 12.0–15.0)
Potassium: 3.7 mmol/L (ref 3.5–5.1)
Sodium: 139 mmol/L (ref 135–145)
TCO2: 23 mmol/L (ref 22–32)

## 2019-03-28 LAB — TROPONIN I (HIGH SENSITIVITY)
Troponin I (High Sensitivity): 3 ng/L (ref <18)
Troponin I (High Sensitivity): 5 ng/L (ref <18)

## 2019-03-28 LAB — URINALYSIS, ROUTINE W REFLEX MICROSCOPIC
Bilirubin Urine: NEGATIVE
Glucose, UA: NEGATIVE mg/dL
Hgb urine dipstick: NEGATIVE
Ketones, ur: 5 mg/dL — AB
Leukocytes,Ua: NEGATIVE
Nitrite: NEGATIVE
Protein, ur: NEGATIVE mg/dL
Specific Gravity, Urine: 1.015 (ref 1.005–1.030)
pH: 5 (ref 5.0–8.0)

## 2019-03-28 LAB — PROTIME-INR
INR: 1 (ref 0.8–1.2)
Prothrombin Time: 13.1 seconds (ref 11.4–15.2)

## 2019-03-28 LAB — APTT: aPTT: 28 seconds (ref 24–36)

## 2019-03-28 LAB — ETHANOL: Alcohol, Ethyl (B): <10 mg/dL (ref <10)

## 2019-03-28 IMAGING — CT CT HEAD W/O CM
4 series · 17 of 47 positions shown, 19 images · non-contrast
Comparison: None.

CLINICAL DATA: Worst headache of life

EXAM:
CT HEAD WITHOUT CONTRAST
TECHNIQUE: Contiguous axial images were obtained from the base of the skull
through the vertex without intravenous contrast.

[Series 3: head wo · axial · 0.44mm/px · z∈[-122,-12]mm · 7 of 30 slices shown, 9 images]
[im 4/30  brain]
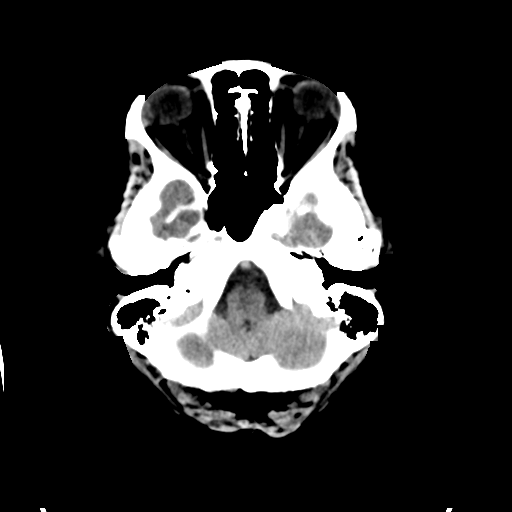
[im 4/30  bone]
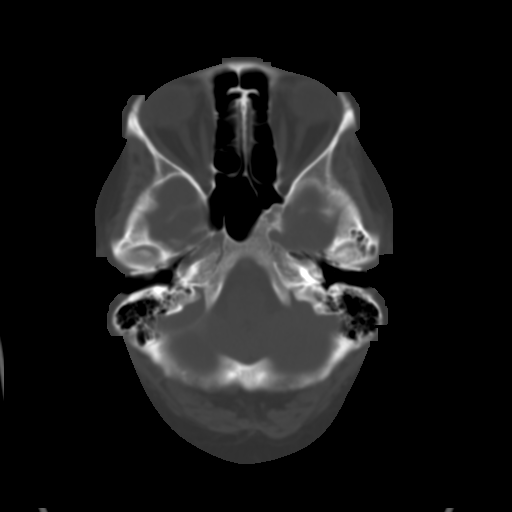
[im 8/30  brain]
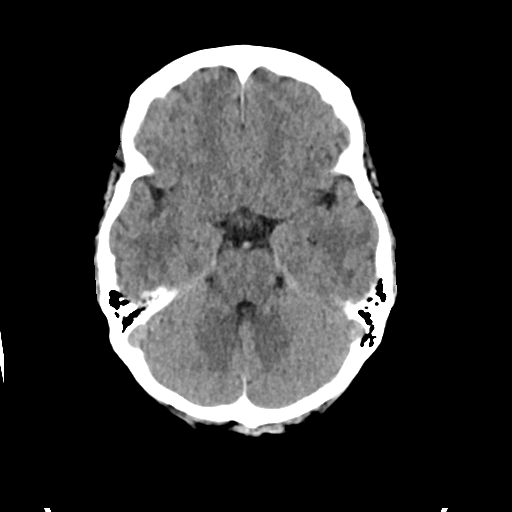
[im 11/30  brain]
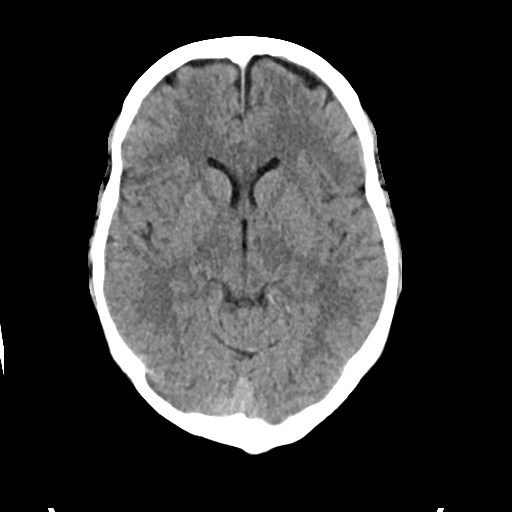
[im 15/30  brain]
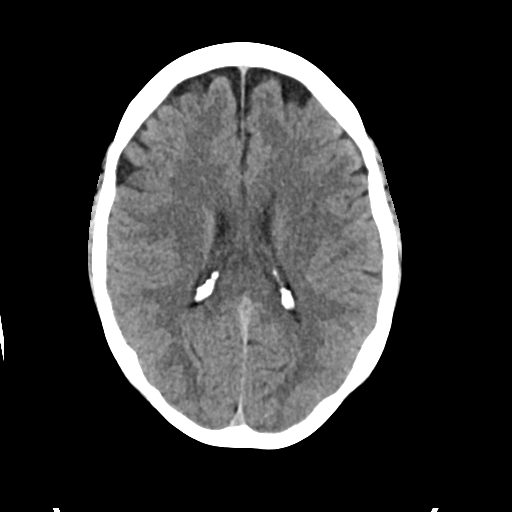
[im 19/30  brain]
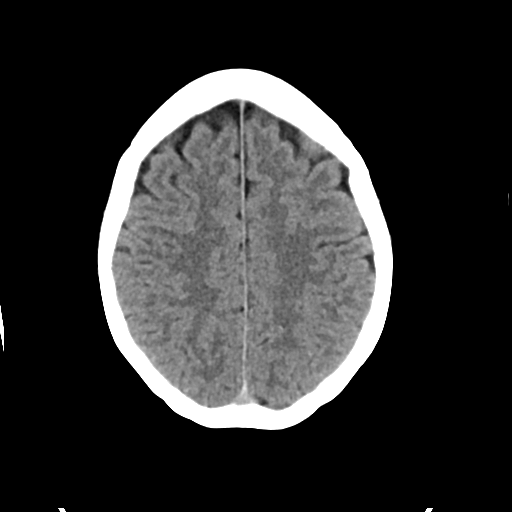
[im 19/30  bone]
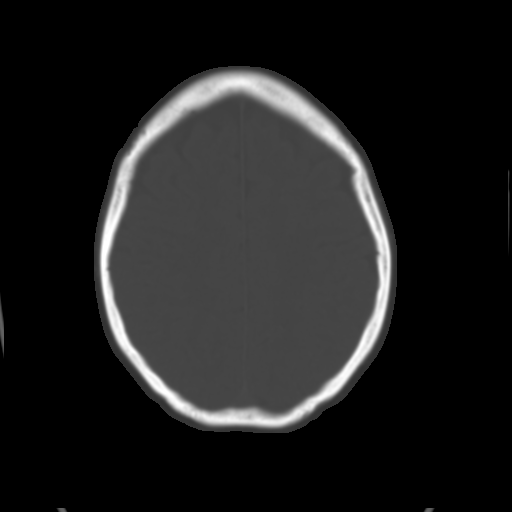
[im 22/30  brain]
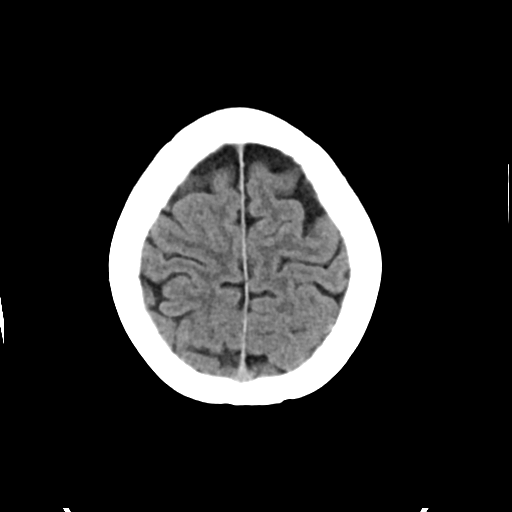
[im 26/30  brain]
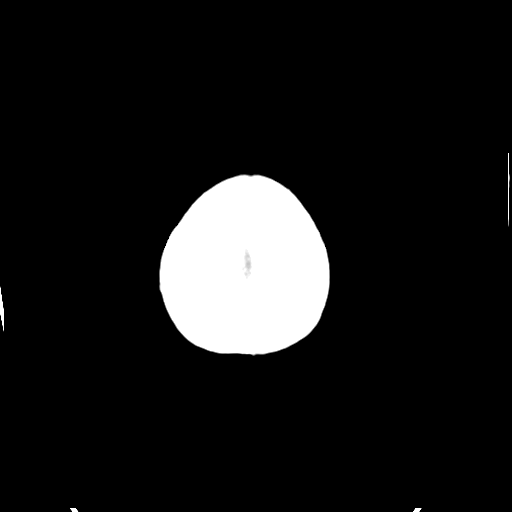

[Series 4: head bone · axial · 0.44mm/px · z∈[-122,-72]mm · 4 of 73 slices shown]
[im 8/73  bone]
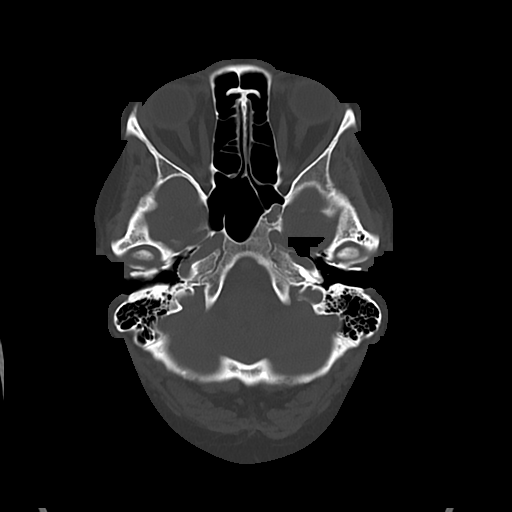
[im 15/73  bone]
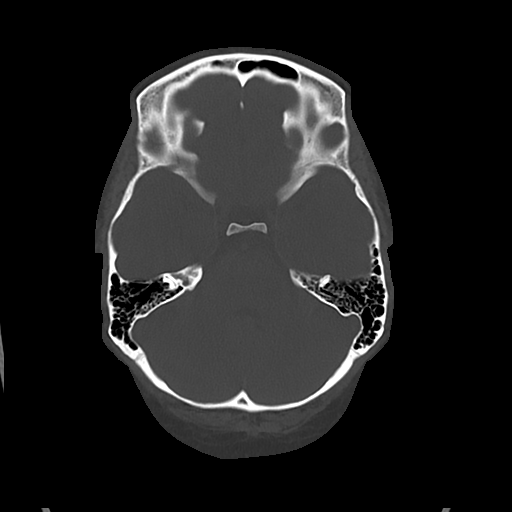
[im 22/73  bone]
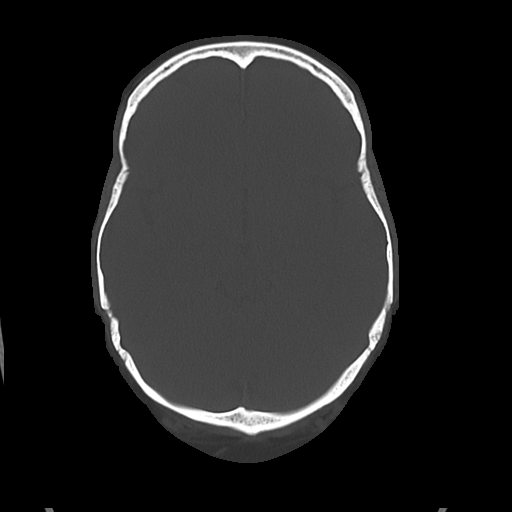
[im 33/73  bone]
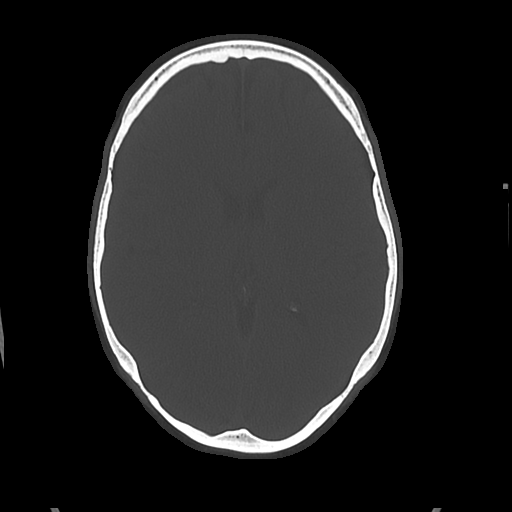

[Series 5: cor soft · coronal · 0.31mm/px · 3 of 64 slices shown]
[im 22/64  brain]
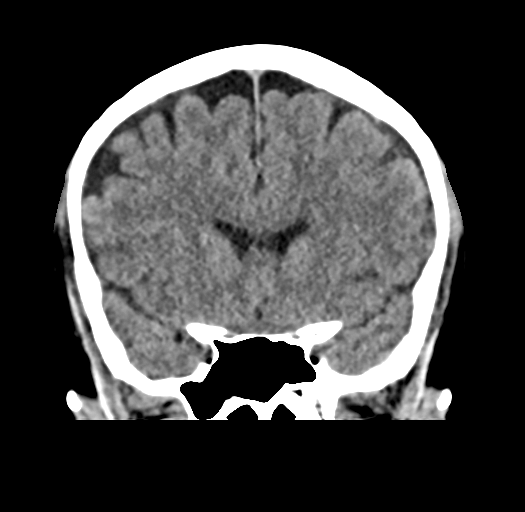
[im 29/64  brain]
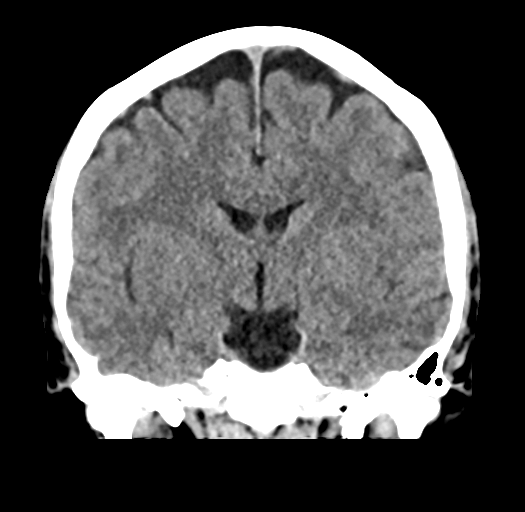
[im 36/64  brain]
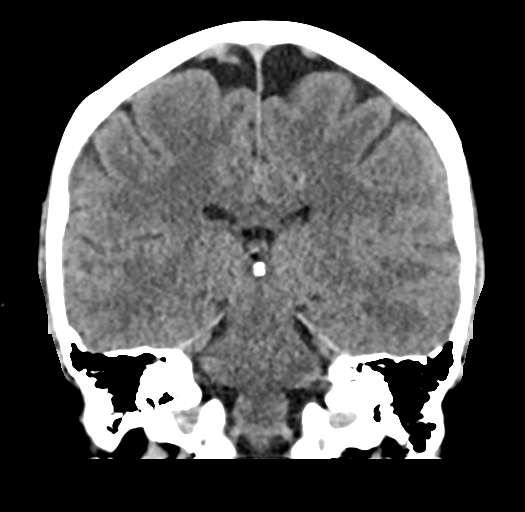

[Series 6: sag soft · sagittal · 0.31mm/px · 3 of 55 slices shown]
[im 19/55  brain]
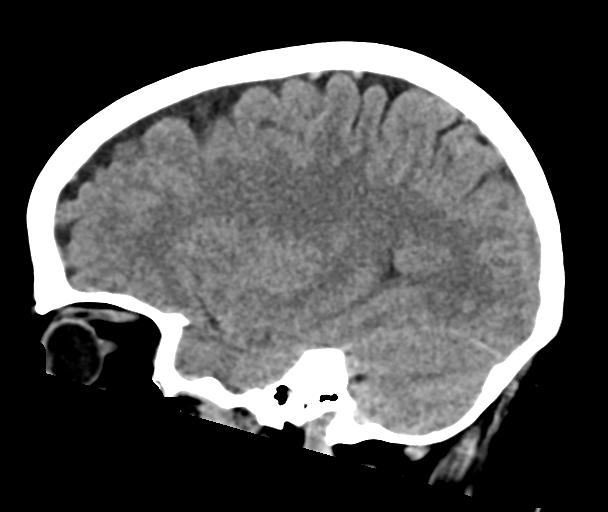
[im 28/55  brain]
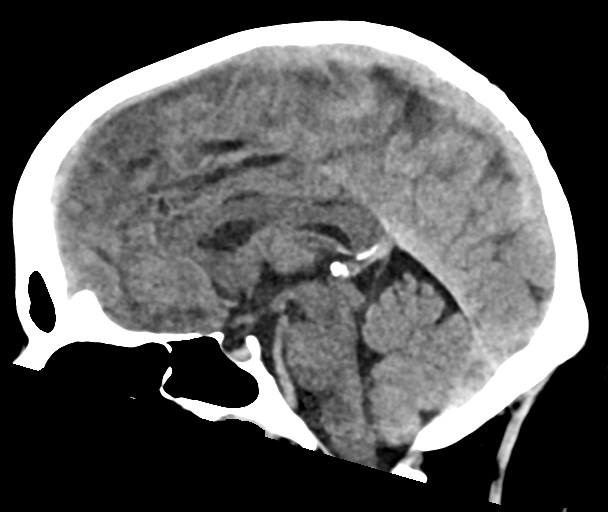
[im 37/55  brain]
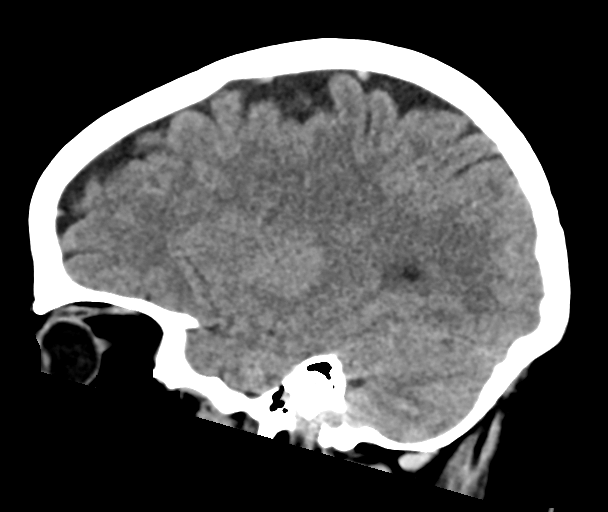

[17 of 47 positions shown; findings below may reference images not displayed]

FINDINGS: Brain: No evidence of acute infarction, hemorrhage, hydrocephalus,
extra-axial collection or mass lesion/mass effect.

Vascular: No hyperdense vessel or unexpected calcification.

Skull: Normal. Negative for fracture or focal lesion.

Sinuses/Orbits: No acute finding.
IMPRESSION: Negative head CT.

## 2019-03-28 IMAGING — MR MR HEAD W/O CM
12 of 13 series · 44 of 48 positions shown · non-contrast
Comparison: Head CT earlier today

CLINICAL DATA: Persistent central vertigo

EXAM:
MRI HEAD WITHOUT CONTRAST
TECHNIQUE: Multiplanar, multiecho pulse sequences of the brain and surrounding
structures were obtained without intravenous contrast.

[Series 5: DWI · axial · 3.0mm · 0.88mm/px · z∈[-74,+76]mm · 9 of 104 slices shown (1 of 4)]
[im 1/104]
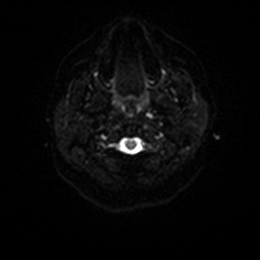
[im 13/104]
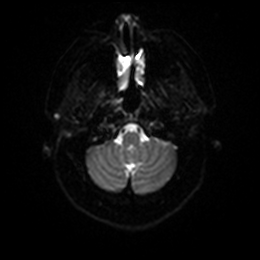
[im 26/104]
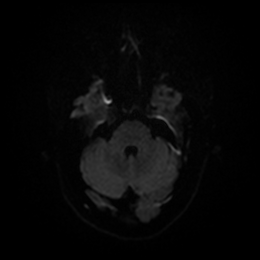
[im 39/104]
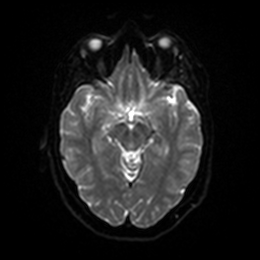
[im 52/104]
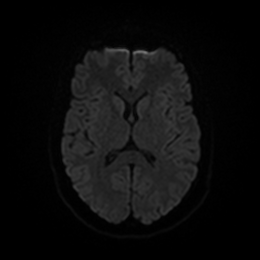
[im 65/104]
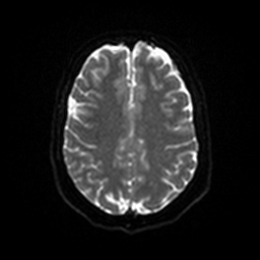
[im 78/104]
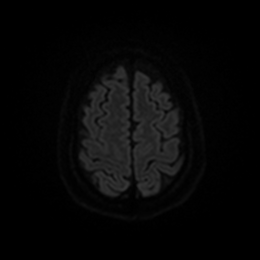
[im 91/104]
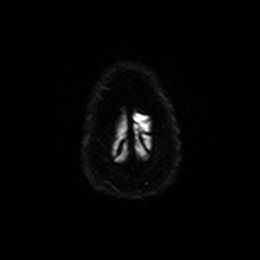
[im 104/104]
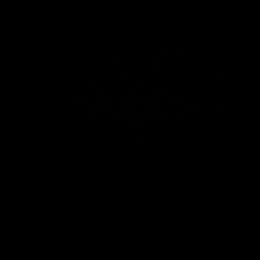

[Series 6: DWI · axial · 3.0mm · 0.88mm/px · z∈[-74,+70]mm · 4 of 50 slices shown (2 of 4)]
[im 1/50]
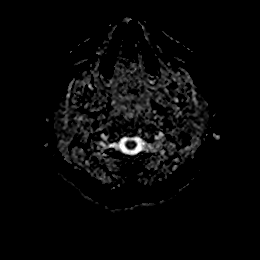
[im 17/50]
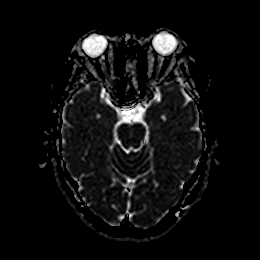
[im 33/50]
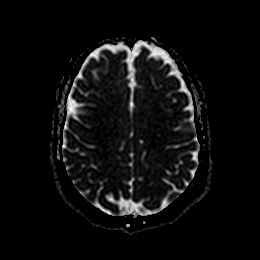
[im 50/50]
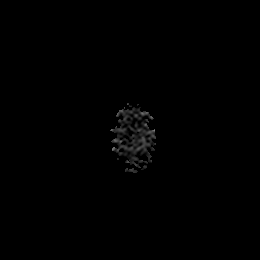

[Series 7: DWI · coronal · 4.0mm · 0.88mm/px · 5 of 64 slices shown (3 of 4)]
[im 1/64]
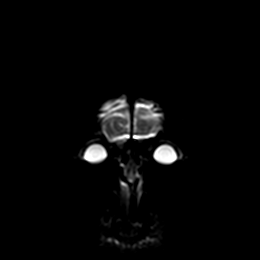
[im 16/64]
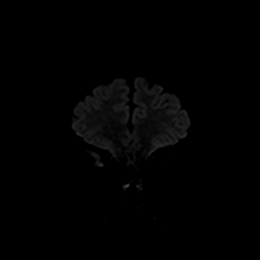
[im 32/64]
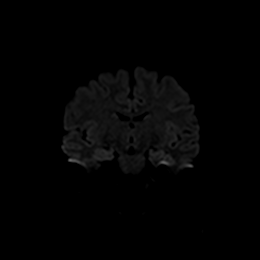
[im 48/64]
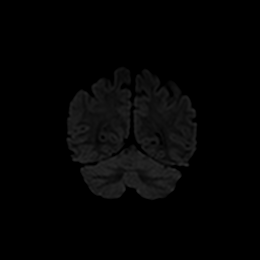
[im 64/64]
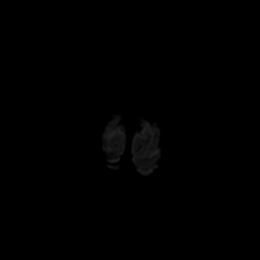

[Series 8: DWI · coronal · 4.0mm · 0.88mm/px · 2 of 32 slices shown (4 of 4)]
[im 1/32]
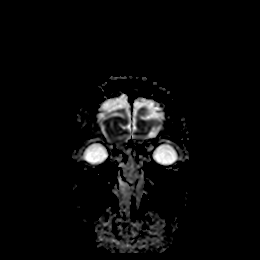
[im 32/32]
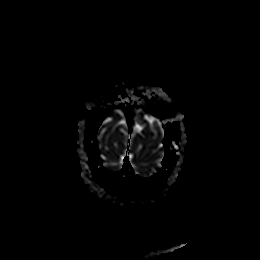

[Series 9: T1 · sagittal · 5.0mm · 0.75mm/px · 2 of 23 slices shown]
[im 1/23]
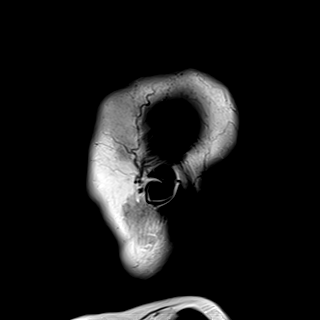
[im 23/23]
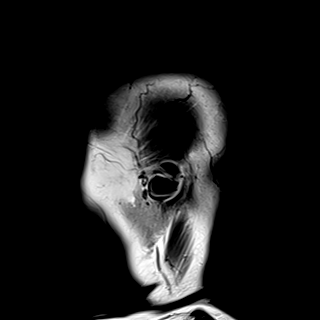

[Series 10: T2 · axial · 5.0mm · 0.72mm/px · z∈[-73,+68]mm · 2 of 25 slices shown (1 of 2)]
[im 1/25]
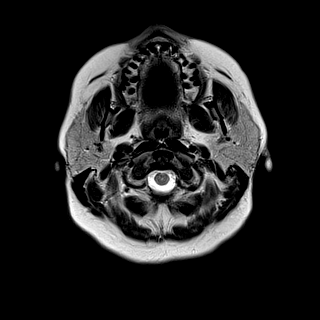
[im 25/25]
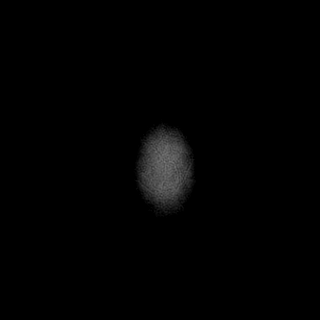

[Series 11: FLAIR · axial · 5.0mm · 0.45mm/px · z∈[-73,+68]mm · 2 of 25 slices shown]
[im 1/25]
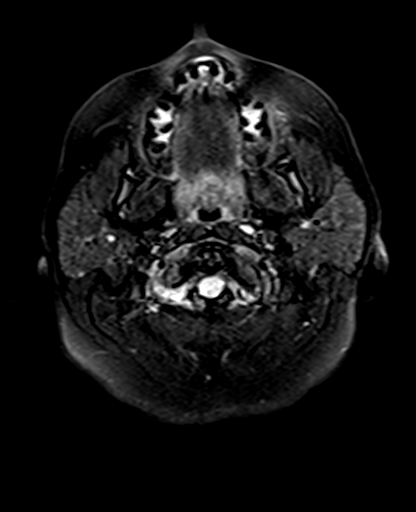
[im 25/25]
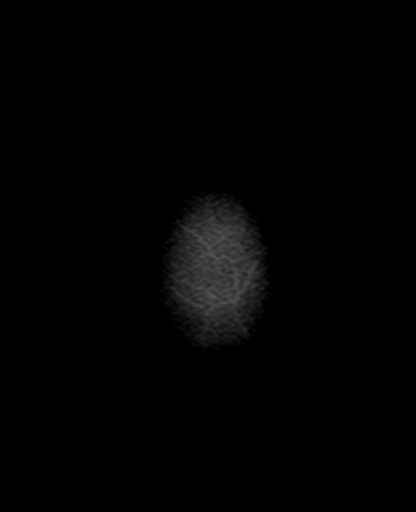

[Series 12: mag_images · axial · 3.0mm · 0.90mm/px · z∈[-89,+84]mm · 4 of 60 slices shown]
[im 1/60]
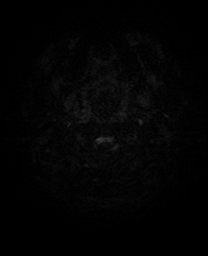
[im 20/60]
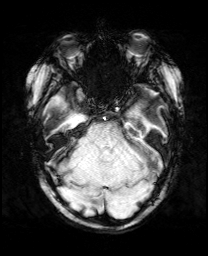
[im 40/60]
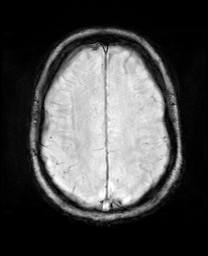
[im 60/60]
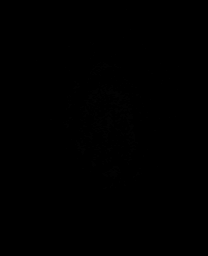

[Series 13: pha_images · axial · 3.0mm · 0.90mm/px · z∈[-87,+84]mm · 4 of 58 slices shown]
[im 1/58]
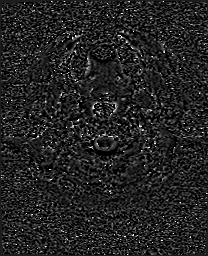
[im 20/58]
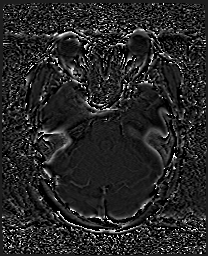
[im 39/58]
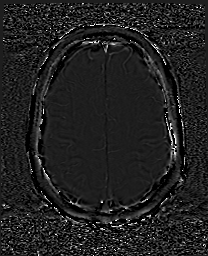
[im 58/58]
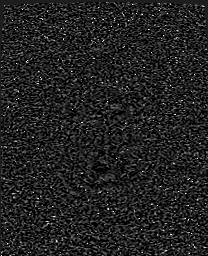

[Series 14: swi_images · axial · 3.0mm · 0.90mm/px · z∈[-89,+84]mm · 4 of 60 slices shown]
[im 1/60]
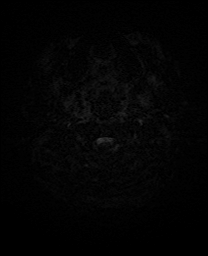
[im 20/60]
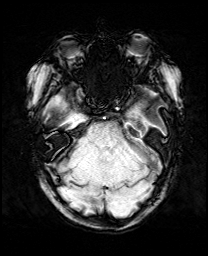
[im 40/60]
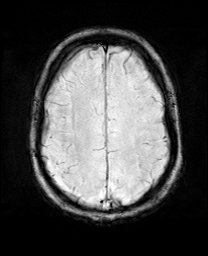
[im 60/60]
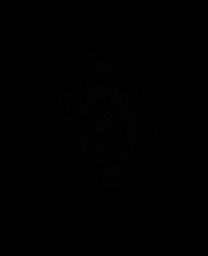

[Series 15: mip_images(sw) · axial · 24.0mm · 0.90mm/px · z∈[-79,+74]mm · 4 of 53 slices shown]
[im 1/53]
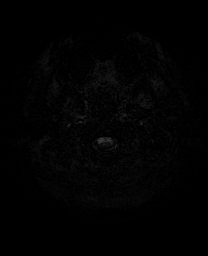
[im 18/53]
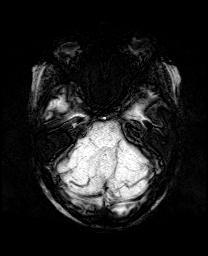
[im 35/53]
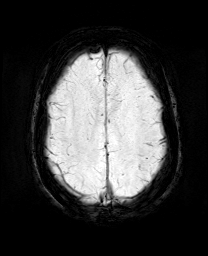
[im 53/53]
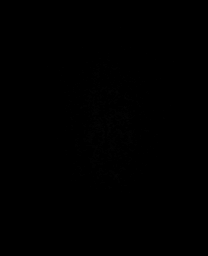

[Series 17: T2 · coronal · 5.0mm · 0.34mm/px · 2 of 29 slices shown (2 of 2)]
[im 1/29]
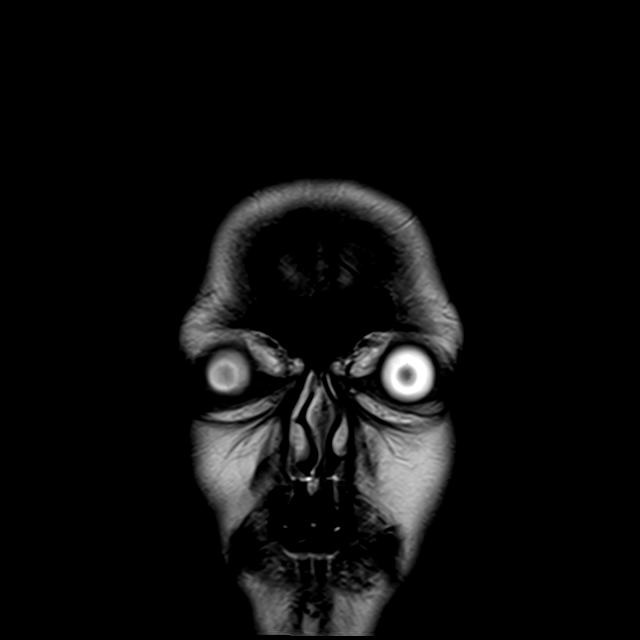
[im 29/29]
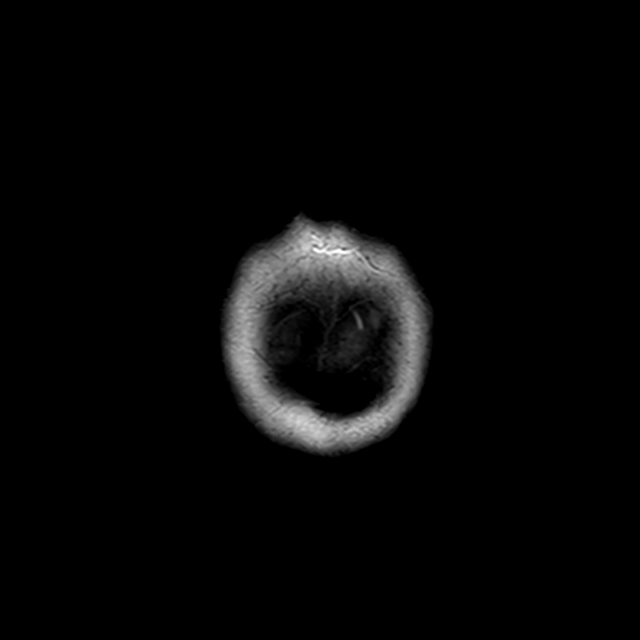

[44 of 48 positions shown; findings below may reference images not displayed]

FINDINGS: Brain: No infarction, hemorrhage, hydrocephalus, extra-axial
collection or mass lesion. No white matter disease.

Vascular: Normal flow voids, including vertebrobasilar

Skull and upper cervical spine: Normal marrow signal.

Sinuses/Orbits: Minimal trapped secretions or arrested aeration in
the right mastoid air cells at the petrous apex without restricted
diffusion or T1 shortening.
IMPRESSION: Normal MRI of the brain.

## 2019-03-28 MED ORDER — DIPHENHYDRAMINE HCL 50 MG/ML IJ SOLN: 25.0000 mg | Freq: Once | INTRAMUSCULAR | Status: DC

## 2019-03-28 MED ORDER — DEXAMETHASONE SODIUM PHOSPHATE 10 MG/ML IJ SOLN: 10.0000 mg | Freq: Once | INTRAMUSCULAR | Status: DC

## 2019-03-28 MED ORDER — METOCLOPRAMIDE HCL 5 MG/ML IJ SOLN: 10.0000 mg | Freq: Once | INTRAMUSCULAR | Status: DC

## 2019-03-28 MED ORDER — PROCHLORPERAZINE EDISYLATE 10 MG/2ML IJ SOLN: 10.0000 mg | Freq: Once | INTRAMUSCULAR | Status: DC

## 2019-03-28 MED ORDER — ACETAMINOPHEN 500 MG PO TABS
1000.0000 mg | ORAL_TABLET | Freq: Once | ORAL | Status: AC
  Administered 2019-03-28: 1000 mg via ORAL
  Filled 2019-03-28: qty 2

## 2019-03-28 MED ORDER — SODIUM CHLORIDE 0.9 % IV BOLUS: 1000.0000 mL | Freq: Once | INTRAVENOUS | Status: DC
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
## 2019-03-28 NOTE — ED Notes (Signed)
Pt verbalizes understanding of d/c instructions. Pt ambulatory at d/c with all belongings.   

## 2019-03-28 NOTE — ED Provider Notes (Signed)
Care assumed from [facility], please see her note for full details, but in brief Alisha Wood is a 33 y.o. female who presents with generalized weakness, lightheadedness, palpitations and dizziness, the symptoms have been intermittent but ongoing over the past few weeks, she had not had any headaches until 1 week ago.  Reports it feels as if "her brain is vibrating".  Has family history of brain tumors.  Did develop headache while here in the emergency department but CT ordered within 3 hours of onset.  Had lab work done at urgent care that was largely reassuring, but presented today due to worsening symptoms.  Lab work in the ED overall reassuring, no leukocytosis, no acute electrolyte derangements, slight transaminitis but no other acute derangements.  Plan is to follow-up on CT head, if negative no concern for intracranial hemorrhage but given family history of brain tumors if CT negative will plan for Noncon MRI of the brain to rule out brain tumor or demyelinating disease.  If MRI is negative feel that patient can likely be discharged home with neurology follow-up.  Cardiac work-up has been reassuring, patient has reported some near syncopal symptoms and palpitations, but with no chest pain or shortness of breath.    Physical Exam  BP 135/66   Pulse 68   Temp 97.9 F (36.6 C) (Oral)   Resp (!) 23   SpO2 100% Levsin, and back from  Physical Exam Vitals signs and nursing note reviewed.  Constitutional:      General: She is not in acute distress.    Appearance: Normal appearance. She is well-developed. She is not diaphoretic.  HENT:     Head: Normocephalic and atraumatic.  Eyes:     General:        Right eye: No discharge.        Left eye: No discharge.  Pulmonary:     Effort: Pulmonary effort is normal. No respiratory distress.  Skin:    General: Skin is warm and dry.  Neurological:     General: No focal deficit present.     Mental Status: She is alert and oriented  to person, place, and time.     Coordination: Coordination normal.  Psychiatric:        Mood and Affect: Mood normal.        Behavior: Behavior normal.     ED Course/Procedures   Labs Reviewed  URINALYSIS, ROUTINE W REFLEX MICROSCOPIC - Abnormal; Notable for the following components:      Result Value   Ketones, ur 5 (*)    All other components within normal limits  HEPATIC FUNCTION PANEL - Abnormal; Notable for the following components:   AST 63 (*)    ALT 110 (*)    All other components within normal limits  CBC WITH DIFFERENTIAL/PLATELET - Abnormal; Notable for the following components:   RBC 3.86 (*)    HCT 34.2 (*)    All other components within normal limits  I-STAT CHEM 8, ED - Abnormal; Notable for the following components:   Calcium, Ion 1.12 (*)    All other components within normal limits  BASIC METABOLIC PANEL  CBC  ETHANOL  PROTIME-INR  APTT  RAPID URINE DRUG SCREEN, HOSP PERFORMED  I-STAT BETA HCG BLOOD, ED ([facility], [facility], [facility] ONLY)  TROPONIN I (HIGH SENSITIVITY)  TROPONIN I (HIGH SENSITIVITY)    Dg Chest 2 View  Result Date: 03/27/2019 CLINICAL DATA:  Dizziness EXAM: CHEST - 2 VIEW COMPARISON:  02/12/2015 FINDINGS:  Heart and mediastinal contours are within normal limits. No focal opacities or effusions. No acute bony abnormality. IMPRESSION: No active cardiopulmonary disease. Electronically Signed   By: Rolm Baptise M.D.   On: 03/27/2019 23:36   Ct Head Wo Contrast  Result Date: 03/28/2019 CLINICAL DATA:  Worst headache of life EXAM: CT HEAD WITHOUT CONTRAST TECHNIQUE: Contiguous axial images were obtained from the base of the skull through the vertex without intravenous contrast. COMPARISON:  None. FINDINGS: Brain: No evidence of acute infarction, hemorrhage, hydrocephalus, extra-axial collection or mass lesion/mass effect. Vascular: No hyperdense vessel or unexpected calcification. Skull: Normal. Negative for fracture or focal lesion. Sinuses/Orbits: No acute  finding. IMPRESSION: Negative head CT. Electronically Signed   By: Monte Fantasia M.D.   On: 03/28/2019 07:31   Mr Brain Wo Contrast  Result Date: 03/28/2019 CLINICAL DATA:  Persistent central vertigo EXAM: MRI HEAD WITHOUT CONTRAST TECHNIQUE: Multiplanar, multiecho pulse sequences of the brain and surrounding structures were obtained without intravenous contrast. COMPARISON:  Head CT earlier today FINDINGS: Brain: No infarction, hemorrhage, hydrocephalus, extra-axial collection or mass lesion. No white matter disease. Vascular: Normal flow voids, including vertebrobasilar Skull and upper cervical spine: Normal marrow signal. Sinuses/Orbits: Minimal trapped secretions or arrested aeration in the right mastoid air cells at the petrous apex without restricted diffusion or T1 shortening. IMPRESSION: Normal MRI of the brain. Electronically Signed   By: Monte Fantasia M.D.   On: 03/28/2019 08:58     Procedures  MDM   On my evaluation she does still complain of mild headache although it has improved.  Continues to have no focal neurologic deficits.  Headache cocktail ordered.  Discussed with patient that CT of her head showed no evidence of intracranial bleeding or other acute abnormality, will proceed with Non-con MRI of the brain.  Case discussed with Dr. Roslynn Amble who is in agreement with plan.    9:17 AM patient declined headache cocktail, and would prefer Tylenol.  MRI of the brain returned and shows no evidence of any masses, lesions or evidence of demyelinating disease, no other acute findings.  Will discuss reassuring results with patient and have her follow-up outpatient with neurology regarding the symptoms.  Cardiac work-up very reassuring, given that patient has been experiencing palpitations, recommend cardiology follow-up as well.   Final diagnoses:  Palpitations  Near syncope  Dizziness  Acute nonintractable headache, unspecified headache type        Jacqlyn Larsen, PA-C 03/28/19  [id number]    Lucrezia Starch, MD 03/29/19 [phone]

## 2019-03-28 NOTE — ED Notes (Signed)
Pt transported to MRI 

## 2019-03-28 NOTE — ED Notes (Signed)
ED Provider at bedside. 

## 2019-03-28 NOTE — Discharge Instructions (Addendum)
Please follow-up with neurology regarding the dizziness, lightheadedness and headaches you have been experiencing.  You can also follow-up with cardiology if you continue to have palpitations.  Your work-up today was very reassuring.  MRI shows no evidence of tumors or other lesions and your lab work overall looks good.  Return to the ED for any new or worsening symptoms.

## 2019-03-29 NOTE — Telephone Encounter (Signed)
Please review. No 30 minutes for ER follow up before Friday that I see

## 2019-03-30 ENCOUNTER — Encounter: Payer: Self-pay | Admitting: Family Medicine

## 2019-03-30 ENCOUNTER — Ambulatory Visit: Payer: 59 | Admitting: Family Medicine

## 2019-03-30 ENCOUNTER — Other Ambulatory Visit: Payer: Self-pay

## 2019-03-30 VITALS — BP 148/100 | HR 91 | Temp 98.2°F | Ht 62.25 in | Wt 256.6 lb

## 2019-03-30 DIAGNOSIS — R7401 Elevation of levels of liver transaminase levels: Secondary | ICD-10-CM

## 2019-03-30 DIAGNOSIS — R740 Nonspecific elevation of levels of transaminase and lactic acid dehydrogenase [LDH]: Secondary | ICD-10-CM

## 2019-03-30 DIAGNOSIS — K760 Fatty (change of) liver, not elsewhere classified: Secondary | ICD-10-CM | POA: Diagnosis not present

## 2019-03-30 DIAGNOSIS — I10 Essential (primary) hypertension: Secondary | ICD-10-CM | POA: Diagnosis not present

## 2019-03-30 DIAGNOSIS — R51 Headache: Secondary | ICD-10-CM

## 2019-03-30 DIAGNOSIS — R519 Headache, unspecified: Secondary | ICD-10-CM | POA: Insufficient documentation

## 2019-03-30 DIAGNOSIS — R42 Dizziness and giddiness: Secondary | ICD-10-CM | POA: Diagnosis not present

## 2019-03-30 MED ORDER — AMLODIPINE BESYLATE 2.5 MG PO TABS: 2.5000 mg | ORAL_TABLET | Freq: Every day | ORAL | 3 refills | Status: DC

## 2019-03-30 NOTE — Telephone Encounter (Signed)
Please call and offer 30 min appt at 1:30pm today

## 2019-03-30 NOTE — Telephone Encounter (Signed)
Pt has been added to schedule ?

## 2019-03-30 NOTE — Progress Notes (Signed)
This visit was conducted in person.  BP (!) 148/100 (BP Location: Right Arm, Cuff Size: Large)   Pulse 91   Temp 98.2 F (36.8 C) (Tympanic)   Ht 5' 2.25" (1.581 m)   Wt 256 lb 9 oz (116.4 kg)   SpO2 97%   BMI 46.55 kg/m   No exam data present   CC: ER f/u visit Subjective:    Patient ID: Alisha Wood, female    DOB: [birth year], 33 y.o.   MRN: [id number]  HPI: Alisha Wood is a 33 y.o. female presenting on 03/30/2019 for Hospitalization Follow-up (Seen at [facility] ED on 03/27/19.)   ER records reviewed. Seen 03/25/2019 at [facility] and again 03/27/2019 at ER for nonspecific dizziness and generalized weakness, palpitations present for several weeks. Headaches started 1 wk ago. She also had spots in vision, palpitations, and new fatigue. Workup included normal CXR, EKG, head CT and MRI, labwork. Planned f/u with neurology - next few days. Next available eye doctor appt was 04/2019.   Headache started at ER described as R hemicranial severe sharp pain that did improve in the hospital. Since has had some dull unilateral R>L frontal ache.   bp remaining elevated - despite limiting sodium to 2000mg .   Newly noted transaminitis of unclear cause.   Patient was worried due to fmhx brain tumors.   She did stop seasonale over the weekend, as well as metformin. Today is feeling markedly better. Wonders if metformin related symptoms?   Denies sleep apnea symptoms - no snoring, no witnessed apneic episodes, no significant daytime sleepiness.      Relevant past medical, surgical, family and social history reviewed and updated as indicated. Interim medical history since our last visit reviewed. Allergies and medications reviewed and updated. Outpatient Medications Prior to Visit  Medication Sig Dispense Refill  . cetirizine (ZYRTEC) 10 MG tablet Take 1 tablet (10 mg total) by mouth daily. (Patient not taking: Reported on 03/28/2019) 30 tablet 11  . Cholecalciferol (VITAMIN D) 50 MCG (2000 UT) CAPS  Take 1 capsule (2,000 Units total) by mouth daily. (Patient not taking: Reported on 03/30/2019) 30 capsule   . levonorgestrel-ethinyl estradiol (SEASONALE,INTROVALE,JOLESSA) 0.15-0.03 MG tablet Take 1 tablet by mouth at bedtime.     . metFORMIN (GLUCOPHAGE) 500 MG tablet Take 1 tablet (500 mg total) by mouth daily with breakfast. (Patient not taking: Reported on 03/30/2019) 90 tablet 1   No facility-administered medications prior to visit.      Per HPI unless specifically indicated in ROS section below Review of Systems Objective:    BP (!) 148/100 (BP Location: Right Arm, Cuff Size: Large)   Pulse 91   Temp 98.2 F (36.8 C) (Tympanic)   Ht 5' 2.25" (1.581 m)   Wt 256 lb 9 oz (116.4 kg)   SpO2 97%   BMI 46.55 kg/m   Wt Readings from Last 3 Encounters:  03/30/19 256 lb 9 oz (116.4 kg)  03/11/19 260 lb 1 oz (118 kg)  02/02/19 264 lb 2 oz (119.8 kg)    Physical Exam Vitals signs and nursing note reviewed.  Constitutional:      General: She is not in acute distress.    Appearance: Normal appearance. She is obese. She is not ill-appearing.  HENT:     Head: Normocephalic and atraumatic.     Mouth/Throat:     Mouth: Mucous membranes are moist.  Eyes:     General:        Right eye: No  discharge.        Left eye: No discharge.     Pupils: Pupils are equal, round, and reactive to light.     Funduscopic exam:    Right eye: No hemorrhage or AV nicking.        Left eye: No hemorrhage or AV nicking.     Comments: Limited fundoscopic exam ?hazy optic disc borders  Musculoskeletal:     Right lower leg: No edema.     Left lower leg: No edema.  Skin:    General: Skin is warm and dry.     Findings: No rash.  Neurological:     General: No focal deficit present.     Mental Status: She is alert. Mental status is at baseline.     Cranial Nerves: Cranial nerves are intact. No cranial nerve deficit.     Sensory: Sensation is intact. No sensory deficit.     Motor: Motor function is intact. No  weakness.     Coordination: Coordination is intact. Romberg sign negative. Coordination normal.     Gait: Gait normal.     Comments:  CN 2-12 intact FTN intact EOMI without palsy  Psychiatric:        Mood and Affect: Mood normal.        Behavior: Behavior normal.       Assessment & Plan:   Problem List Items Addressed This Visit    Transaminitis    In h/o NAFLD possibly related. Last abd [place] 2015. Pt worried metformin may have worsened this - agree with discontinuing medication given recent symptoms. Will recheck today along with ferritin levels.       Relevant Orders   Hepatic function panel   Ferritin   NAFLD (nonalcoholic fatty liver disease)   Hypertension - Primary    Of new onset. Pt already has low sodium diet. Will start low dose amlodipine in an effort to control blood pressure while she undergoes neurology and ophthalmology eval.       Relevant Medications   amLODipine (NORVASC) 2.5 MG tablet   Other Relevant Orders   Ambulatory referral to Ophthalmology   Headache    Overall benign exam. ?HTN related. No signs of OSA. I do want her to undergo formal eye exam eval for pseudotumor and r/o papilledema so will refer. Doubt cerebral venous thrombosis as symptoms are largely improved off metformin, not progressive.       Relevant Medications   amLODipine (NORVASC) 2.5 MG tablet   Other Relevant Orders   Ambulatory referral to Ophthalmology   Dizziness and giddiness    Describes woozy feeling that is largely better since stopping metformin ?related. Will stay off this medication now, await neuro eval tomorrow.           Meds ordered this encounter  Medications  . amLODipine (NORVASC) 2.5 MG tablet    Sig: Take 1 tablet (2.5 mg total) by mouth daily.    Dispense:  30 tablet    Refill:  3   Orders Placed This Encounter  Procedures  . Hepatic function panel  . Ferritin  . Ambulatory referral to Ophthalmology    Referral Priority:   Urgent    Referral Type:    Consultation    Referral Reason:   Specialty Services Required    Requested Specialty:   Ophthalmology    Number of Visits Requested:   1    Patient Instructions  We will refer you for eye exam.  Keep neurology appointment.  Stay off metformin at this time.  Hold birth control until you see neurology.  Start amlodipine 2.5mg  daily for better BP control over next few weeks.  Labs today.    Follow up plan: No follow-ups on file.  [name], MD

## 2019-03-30 NOTE — Assessment & Plan Note (Addendum)
Of new onset. Pt already has low sodium diet. Will start low dose amlodipine in an effort to control blood pressure while she undergoes neurology and ophthalmology eval.

## 2019-03-30 NOTE — Assessment & Plan Note (Addendum)
In h/o NAFLD possibly related. Last abd [place] 2015. Pt worried metformin may have worsened this - agree with discontinuing medication given recent symptoms. Will recheck today along with ferritin levels.

## 2019-03-30 NOTE — Assessment & Plan Note (Signed)
Describes woozy feeling that is largely better since stopping metformin ?related. Will stay off this medication now, await neuro eval tomorrow.

## 2019-03-30 NOTE — Assessment & Plan Note (Addendum)
Overall benign exam. ?HTN related. No signs of OSA. I do want her to undergo formal eye exam eval for pseudotumor and r/o papilledema so will refer. Doubt cerebral venous thrombosis as symptoms are largely improved off metformin, not progressive.

## 2019-03-30 NOTE — Patient Instructions (Addendum)
We will refer you for eye exam.  Keep neurology appointment.  Stay off metformin at this time.  Hold birth control until you see neurology.  Start amlodipine 2.5mg  daily for better BP control over next few weeks.  Labs today.

## 2019-03-31 ENCOUNTER — Ambulatory Visit: Payer: 59 | Admitting: Neurology

## 2019-03-31 ENCOUNTER — Encounter: Payer: Self-pay | Admitting: Neurology

## 2019-03-31 VITALS — BP 133/90 | HR 75 | Temp 98.4°F | Ht 62.25 in | Wt 256.5 lb

## 2019-03-31 DIAGNOSIS — G43709 Chronic migraine without aura, not intractable, without status migrainosus: Secondary | ICD-10-CM | POA: Diagnosis not present

## 2019-03-31 DIAGNOSIS — IMO0002 Reserved for concepts with insufficient information to code with codable children: Secondary | ICD-10-CM | POA: Insufficient documentation

## 2019-03-31 LAB — HEPATIC FUNCTION PANEL
ALT: 138 U/L — ABNORMAL HIGH (ref 0–35)
AST: 61 U/L — ABNORMAL HIGH (ref 0–37)
Albumin: 4.2 g/dL (ref 3.5–5.2)
Alkaline Phosphatase: 59 U/L (ref 39–117)
Bilirubin, Direct: 0.1 mg/dL (ref 0.0–0.3)
Total Bilirubin: 0.5 mg/dL (ref 0.2–1.2)
Total Protein: 7.2 g/dL (ref 6.0–8.3)

## 2019-03-31 LAB — FERRITIN: Ferritin: 76.4 ng/mL (ref 10.0–291.0)
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
## 2019-04-01 ENCOUNTER — Ambulatory Visit: Payer: 59 | Admitting: Family Medicine

## 2019-04-05 ENCOUNTER — Encounter: Payer: Self-pay | Admitting: Family Medicine

## 2019-04-25 ENCOUNTER — Other Ambulatory Visit: Payer: Self-pay

## 2019-04-25 ENCOUNTER — Encounter: Payer: Self-pay | Admitting: Family Medicine

## 2019-04-25 ENCOUNTER — Ambulatory Visit: Payer: 59 | Admitting: Family Medicine

## 2019-04-25 VITALS — BP 124/82 | HR 89 | Temp 97.6°F | Ht 62.25 in | Wt 251.4 lb

## 2019-04-25 DIAGNOSIS — R42 Dizziness and giddiness: Secondary | ICD-10-CM | POA: Diagnosis not present

## 2019-04-25 DIAGNOSIS — H52222 Regular astigmatism, left eye: Secondary | ICD-10-CM | POA: Diagnosis not present

## 2019-04-25 DIAGNOSIS — I10 Essential (primary) hypertension: Secondary | ICD-10-CM | POA: Diagnosis not present

## 2019-04-25 DIAGNOSIS — H5203 Hypermetropia, bilateral: Secondary | ICD-10-CM | POA: Diagnosis not present

## 2019-04-25 DIAGNOSIS — Z23 Encounter for immunization: Secondary | ICD-10-CM

## 2019-04-25 DIAGNOSIS — R7401 Elevation of levels of liver transaminase levels: Secondary | ICD-10-CM | POA: Diagnosis not present

## 2019-04-25 NOTE — Patient Instructions (Addendum)
Let's hold amlodipine for now, if BP creeping up then restart and let me know to send in more refills.  Continue limiting salt/sodium in diet.  Labs today Flu shot today
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
## 2019-04-25 NOTE — Assessment & Plan Note (Signed)
Marked improvement off metformin. ?side effect to this.

## 2019-04-25 NOTE — Assessment & Plan Note (Signed)
Improved readings over the past month. Ok to trial off amlodipine, restart if BP elevation noted. Pt agrees with plan.

## 2019-04-25 NOTE — Assessment & Plan Note (Signed)
Update labs. H/o NAFLD.

## 2019-04-25 NOTE — Assessment & Plan Note (Signed)
She continues working towards healthy diet and lifestyle changes to affect sustainable weight loss

## 2019-04-26 LAB — HEPATIC FUNCTION PANEL
ALT: 58 U/L — ABNORMAL HIGH (ref 0–35)
AST: 40 U/L — ABNORMAL HIGH (ref 0–37)
Albumin: 4.3 g/dL (ref 3.5–5.2)
Alkaline Phosphatase: 70 U/L (ref 39–117)
Bilirubin, Direct: 0.1 mg/dL (ref 0.0–0.3)
Total Bilirubin: 0.7 mg/dL (ref 0.2–1.2)
Total Protein: 7.4 g/dL (ref 6.0–8.3)

## 2019-05-24 ENCOUNTER — Encounter: Payer: Self-pay | Admitting: Family Medicine

## 2019-05-24 NOTE — Telephone Encounter (Signed)
Last OV 04/25/2019 - no follow instructions given.  Pt scheduled appt for 06/14/2019 assuming for BP follow up.   Instructions 04/25/2019 Let's hold amlodipine for now, if BP creeping up then restart and let me know to send in more refills.  Continue limiting salt/sodium in diet.  Labs today Flu shot today      Pt is wanting to know if she needs to keep upcoming appt.   Please advise, thanks.

## 2019-05-24 NOTE — Telephone Encounter (Signed)
Ok to cancel appointment. Thanks

## 2019-05-25 NOTE — Telephone Encounter (Signed)
Appt cx.

## 2019-06-14 ENCOUNTER — Ambulatory Visit: Payer: 59 | Admitting: Family Medicine

## 2019-07-05 ENCOUNTER — Encounter: Payer: Self-pay | Admitting: Family Medicine

## 2019-07-31 ENCOUNTER — Encounter: Payer: Self-pay | Admitting: Family Medicine

## 2019-08-01 MED ORDER — EPINEPHRINE 0.3 MG/0.3ML IJ SOAJ: 0.3000 mg | INTRAMUSCULAR | 1 refills | Status: DC | PRN

## 2019-08-05 ENCOUNTER — Other Ambulatory Visit: Payer: 59

## 2019-08-09 DIAGNOSIS — Z3689 Encounter for other specified antenatal screening: Secondary | ICD-10-CM | POA: Diagnosis not present

## 2019-08-09 DIAGNOSIS — Z3200 Encounter for pregnancy test, result unknown: Secondary | ICD-10-CM | POA: Diagnosis not present

## 2019-08-12 ENCOUNTER — Encounter: Payer: Self-pay | Admitting: Family Medicine

## 2019-08-26 DIAGNOSIS — Z3201 Encounter for pregnancy test, result positive: Secondary | ICD-10-CM | POA: Diagnosis not present

## 2019-09-12 DIAGNOSIS — Z3689 Encounter for other specified antenatal screening: Secondary | ICD-10-CM | POA: Diagnosis not present

## 2019-09-12 DIAGNOSIS — O09521 Supervision of elderly multigravida, first trimester: Secondary | ICD-10-CM | POA: Diagnosis not present

## 2019-09-12 LAB — OB RESULTS CONSOLE HEPATITIS B SURFACE ANTIGEN: Hepatitis B Surface Ag: NEGATIVE

## 2019-09-12 LAB — OB RESULTS CONSOLE HIV ANTIBODY (ROUTINE TESTING): HIV: NONREACTIVE

## 2019-09-12 LAB — OB RESULTS CONSOLE ABO/RH: RH Type: POSITIVE

## 2019-09-12 LAB — OB RESULTS CONSOLE ANTIBODY SCREEN: Antibody Screen: NEGATIVE

## 2019-09-12 LAB — OB RESULTS CONSOLE RUBELLA ANTIBODY, IGM: Rubella: IMMUNE

## 2019-09-12 LAB — OB RESULTS CONSOLE GC/CHLAMYDIA
Chlamydia: NEGATIVE
Gonorrhea: NEGATIVE

## 2019-09-12 LAB — OB RESULTS CONSOLE RPR: RPR: NONREACTIVE

## 2019-09-23 DIAGNOSIS — O09521 Supervision of elderly multigravida, first trimester: Secondary | ICD-10-CM | POA: Diagnosis not present

## 2019-09-23 DIAGNOSIS — Z3689 Encounter for other specified antenatal screening: Secondary | ICD-10-CM | POA: Diagnosis not present

## 2019-09-23 DIAGNOSIS — Z3A10 10 weeks gestation of pregnancy: Secondary | ICD-10-CM | POA: Diagnosis not present

## 2019-10-05 ENCOUNTER — Encounter: Payer: Self-pay | Admitting: Family Medicine

## 2019-10-05 DIAGNOSIS — Z9189 Other specified personal risk factors, not elsewhere classified: Secondary | ICD-10-CM

## 2019-10-10 ENCOUNTER — Other Ambulatory Visit (INDEPENDENT_AMBULATORY_CARE_PROVIDER_SITE_OTHER): Payer: 59

## 2019-10-10 DIAGNOSIS — Z9189 Other specified personal risk factors, not elsewhere classified: Secondary | ICD-10-CM

## 2019-10-11 LAB — SAR COV2 SEROLOGY (COVID19)AB(IGG),IA: SARS CoV2 AB IGG: NEGATIVE

## 2019-10-19 ENCOUNTER — Other Ambulatory Visit: Payer: Self-pay | Admitting: Obstetrics and Gynecology

## 2019-10-31 MED FILL — OMRON 3 SERIES BP MONITOR D: 1 days supply | Qty: 1 | Fill #0

## 2019-11-15 DIAGNOSIS — O09522 Supervision of elderly multigravida, second trimester: Secondary | ICD-10-CM | POA: Diagnosis not present

## 2019-11-15 DIAGNOSIS — Z3A18 18 weeks gestation of pregnancy: Secondary | ICD-10-CM | POA: Diagnosis not present

## 2020-01-12 DIAGNOSIS — O133 Gestational [pregnancy-induced] hypertension without significant proteinuria, third trimester: Secondary | ICD-10-CM | POA: Diagnosis not present

## 2020-01-12 DIAGNOSIS — Z3A26 26 weeks gestation of pregnancy: Secondary | ICD-10-CM | POA: Diagnosis not present

## 2020-01-12 DIAGNOSIS — Z3689 Encounter for other specified antenatal screening: Secondary | ICD-10-CM | POA: Diagnosis not present

## 2020-01-25 DIAGNOSIS — Z3A28 28 weeks gestation of pregnancy: Secondary | ICD-10-CM | POA: Diagnosis not present

## 2020-01-25 DIAGNOSIS — O133 Gestational [pregnancy-induced] hypertension without significant proteinuria, third trimester: Secondary | ICD-10-CM | POA: Diagnosis not present

## 2020-01-25 DIAGNOSIS — Z3689 Encounter for other specified antenatal screening: Secondary | ICD-10-CM | POA: Diagnosis not present

## 2020-02-01 DIAGNOSIS — O133 Gestational [pregnancy-induced] hypertension without significant proteinuria, third trimester: Secondary | ICD-10-CM | POA: Diagnosis not present

## 2020-02-01 DIAGNOSIS — Z3A29 29 weeks gestation of pregnancy: Secondary | ICD-10-CM | POA: Diagnosis not present

## 2020-02-07 DIAGNOSIS — O133 Gestational [pregnancy-induced] hypertension without significant proteinuria, third trimester: Secondary | ICD-10-CM | POA: Diagnosis not present

## 2020-02-07 DIAGNOSIS — Z3A30 30 weeks gestation of pregnancy: Secondary | ICD-10-CM | POA: Diagnosis not present

## 2020-02-14 DIAGNOSIS — O133 Gestational [pregnancy-induced] hypertension without significant proteinuria, third trimester: Secondary | ICD-10-CM | POA: Diagnosis not present

## 2020-02-14 DIAGNOSIS — Z3A31 31 weeks gestation of pregnancy: Secondary | ICD-10-CM | POA: Diagnosis not present

## 2020-02-21 DIAGNOSIS — O133 Gestational [pregnancy-induced] hypertension without significant proteinuria, third trimester: Secondary | ICD-10-CM | POA: Diagnosis not present

## 2020-02-21 DIAGNOSIS — Z3A32 32 weeks gestation of pregnancy: Secondary | ICD-10-CM | POA: Diagnosis not present

## 2020-02-27 ENCOUNTER — Other Ambulatory Visit: Payer: Self-pay | Admitting: Obstetrics and Gynecology

## 2020-02-28 DIAGNOSIS — O133 Gestational [pregnancy-induced] hypertension without significant proteinuria, third trimester: Secondary | ICD-10-CM | POA: Diagnosis not present

## 2020-02-28 DIAGNOSIS — Z3A33 33 weeks gestation of pregnancy: Secondary | ICD-10-CM | POA: Diagnosis not present

## 2020-03-06 DIAGNOSIS — Z3A34 34 weeks gestation of pregnancy: Secondary | ICD-10-CM | POA: Diagnosis not present

## 2020-03-06 DIAGNOSIS — O133 Gestational [pregnancy-induced] hypertension without significant proteinuria, third trimester: Secondary | ICD-10-CM | POA: Diagnosis not present

## 2020-03-06 DIAGNOSIS — Z23 Encounter for immunization: Secondary | ICD-10-CM | POA: Diagnosis not present

## 2020-03-08 ENCOUNTER — Telehealth: Payer: Self-pay

## 2020-03-08 NOTE — Telephone Encounter (Signed)
What is reason for OBGYN not wanting her to get covid vaccine until after 6wks postpartum? I think it would be ok to get postpartum but would want to see reasoning behind OB's recommendation.  I would recommend Pfizer if possible, or Moderna.

## 2020-03-08 NOTE — Telephone Encounter (Signed)
Pt is almost [redacted] weeks pregnant. Scheduled for C-Section at 37 weeks. Her OB is against her getting the Covid vaccine pregnant and up to 6 week postpartum.   Pt does not want to wait to get the vaccine. Would like to get it in the hospital after delivery if possible. Asking if Dr Darnell Level has any recommendations as to when she can safely get the vaccine and which one she should get.    She is aware Dr Darnell Level is out of the office this week and there will be a delay in his response.

## 2020-03-09 NOTE — Telephone Encounter (Signed)
Patient notified as instructed by telephone and verbalized understanding. Patient stated that her OB/GYN  just told her that he just does not recommend the vaccine at this point for her. Patient stated that she was hoping to get the vaccine when she is in the hospital after she has the baby since the hospital is offering it. Patient stated that she is due to see her OB/GYN Tuesday and will have more of a conversation with him about the vaccine at that appointment.

## 2020-03-11 NOTE — Telephone Encounter (Signed)
Noted! Thank you

## 2020-03-13 DIAGNOSIS — Z3A35 35 weeks gestation of pregnancy: Secondary | ICD-10-CM | POA: Diagnosis not present

## 2020-03-13 DIAGNOSIS — O133 Gestational [pregnancy-induced] hypertension without significant proteinuria, third trimester: Secondary | ICD-10-CM | POA: Diagnosis not present

## 2020-03-13 DIAGNOSIS — Z3685 Encounter for antenatal screening for Streptococcus B: Secondary | ICD-10-CM | POA: Diagnosis not present

## 2020-03-13 LAB — OB RESULTS CONSOLE GBS: GBS: NEGATIVE

## 2020-03-19 ENCOUNTER — Inpatient Hospital Stay (HOSPITAL_COMMUNITY)
Admission: AD | Admit: 2020-03-19 | Discharge: 2020-03-19 | Disposition: A | Discharge status: Home / Self Care | Payer: 59 | Attending: Obstetrics | Admitting: Obstetrics

## 2020-03-19 ENCOUNTER — Encounter (HOSPITAL_COMMUNITY): Payer: Self-pay | Admitting: *Deleted

## 2020-03-19 DIAGNOSIS — Z3A36 36 weeks gestation of pregnancy: Secondary | ICD-10-CM | POA: Insufficient documentation

## 2020-03-19 DIAGNOSIS — O133 Gestational [pregnancy-induced] hypertension without significant proteinuria, third trimester: Secondary | ICD-10-CM

## 2020-03-19 DIAGNOSIS — N898 Other specified noninflammatory disorders of vagina: Secondary | ICD-10-CM | POA: Diagnosis not present

## 2020-03-19 DIAGNOSIS — Z3493 Encounter for supervision of normal pregnancy, unspecified, third trimester: Secondary | ICD-10-CM

## 2020-03-19 DIAGNOSIS — O26893 Other specified pregnancy related conditions, third trimester: Secondary | ICD-10-CM | POA: Diagnosis not present

## 2020-03-19 LAB — POCT FERN TEST: POCT Fern Test: NEGATIVE

## 2020-03-19 NOTE — [facility] Provider Note (Signed)
First Provider Initiated Contact with Patient 03/19/20 1901      S: Ms. Alisha Wood is a 34 y.o. G2P1001 at [redacted]w[redacted]d  who presents to [facility] today complaining of leaking a gush of fluid when she voided earlier today. She denies vaginal bleeding. She denies contractions. She reports normal fetal movement.    O: BP 118/86 (BP Location: Right Arm)   Pulse 96   Temp 98.4 F (36.9 C)   Resp 17   Ht 5\' 2"  (1.575 m)   Wt 125.6 kg   LMP 03/28/2019   BMI 50.66 kg/m  GENERAL: Well-developed, well-nourished female in no acute distress.  HEAD: Normocephalic, atraumatic.  CHEST: Normal effort of breathing, regular heart rate ABDOMEN: Soft, nontender, gravid PELVIC: Normal external female genitalia. Vagina is pink and rugated. Cervix with normal contour, no lesions. Normal discharge.  Neg pooling.   Cervical exam:  Dilation: Closed Effacement (%): Thick Cervical Position: Posterior Exam by:: AThurman Coyer, RN   Fetal Monitoring: Baseline: 140 Variability: Mod Accelerations: 15 x 15 Decelerations: None Contractions: Quiet  Results for orders placed or performed during the hospital encounter of 03/19/20 (from the past 24 hour(s))  POCT fern test     Status: None   Collection Time: 03/19/20  7:18 PM  Result Value Ref Range   POCT Fern Test Negative = intact amniotic membranes      A: SIUP at [redacted]w[redacted]d  Negative pooling Negative fern Intact amniotic sac Elevated BP in setting of GHTN, no severe range or severe features  P: Discharge home in stable condition  F/U: Next appt with [facility] is tomorrow 08/31  Mallie Snooks, CNM 03/19/2020 8:49 PM

## 2020-03-19 NOTE — MAU Note (Signed)
Pt reports she went to the [facility] her panties where wet before and a lot of fluid came out .Denies any vag bleeding. Stated she feels "slimey" stuff coming out. Good fetal movement reported and increased pelvic pressure. Repeat c-section scheduled 03/28/2020.

## 2020-03-19 NOTE — Discharge Instructions (Signed)

## 2020-03-20 DIAGNOSIS — Z3A36 36 weeks gestation of pregnancy: Secondary | ICD-10-CM | POA: Diagnosis not present

## 2020-03-20 DIAGNOSIS — O133 Gestational [pregnancy-induced] hypertension without significant proteinuria, third trimester: Secondary | ICD-10-CM | POA: Diagnosis not present

## 2020-03-21 ENCOUNTER — Encounter (HOSPITAL_COMMUNITY): Payer: Self-pay

## 2020-03-21 DIAGNOSIS — Z3A36 36 weeks gestation of pregnancy: Secondary | ICD-10-CM | POA: Diagnosis not present

## 2020-03-21 DIAGNOSIS — O133 Gestational [pregnancy-induced] hypertension without significant proteinuria, third trimester: Secondary | ICD-10-CM | POA: Diagnosis not present

## 2020-03-21 NOTE — Patient Instructions (Signed)
TENYA ARAQUE  03/21/2020   Your procedure is scheduled on:  03/28/2020  Arrive at 20 at Entrance C on [address] at [facility]  and [employer] [address]. You are invited to use the FREE valet parking or use the Visitor's parking deck.  Pick up the phone at the desk and dial [phone].  Call this number if you have problems the morning of surgery: [phone]  Remember:   Do not eat food:(After Midnight) Desps de medianoche.  Do not drink clear liquids: (After Midnight) Desps de medianoche.  Take these medicines the morning of surgery with A SIP OF WATER:  none   Do not wear jewelry, make-up or nail polish.  Do not wear lotions, powders, or perfumes. Do not wear deodorant.  Do not shave 48 hours prior to surgery.  Do not bring valuables to the hospital.  [facility] is not   responsible for any belongings or valuables brought to the hospital.  Contacts, dentures or bridgework may not be worn into surgery.  Leave suitcase in the car. After surgery it may be brought to your room.  For patients admitted to the hospital, checkout time is 11:00 AM the day of              discharge.      Please read over the following fact sheets that you were given:     Preparing for Surgery

## 2020-03-24 ENCOUNTER — Encounter (HOSPITAL_COMMUNITY): Payer: Self-pay | Admitting: Obstetrics and Gynecology

## 2020-03-24 ENCOUNTER — Other Ambulatory Visit: Payer: Self-pay

## 2020-03-24 ENCOUNTER — Inpatient Hospital Stay (HOSPITAL_COMMUNITY): Payer: 59 | Admitting: Anesthesiology

## 2020-03-24 ENCOUNTER — Inpatient Hospital Stay (HOSPITAL_COMMUNITY)
Admission: AD | Admit: 2020-03-24 | Discharge: 2020-03-26 | DRG: 787 | Disposition: A | Discharge status: Home / Self Care | Payer: 59 | Attending: Obstetrics and Gynecology | Admitting: Obstetrics and Gynecology

## 2020-03-24 ENCOUNTER — Encounter (HOSPITAL_COMMUNITY)
Admission: AD | Disposition: A | Discharge status: Home / Self Care | Payer: Self-pay | Source: Home / Self Care | Attending: Obstetrics and Gynecology

## 2020-03-24 DIAGNOSIS — Z3A00 Weeks of gestation of pregnancy not specified: Secondary | ICD-10-CM | POA: Diagnosis not present

## 2020-03-24 DIAGNOSIS — Z6841 Body Mass Index (BMI) 40.0 and over, adult: Secondary | ICD-10-CM

## 2020-03-24 DIAGNOSIS — O99214 Obesity complicating childbirth: Secondary | ICD-10-CM | POA: Diagnosis present

## 2020-03-24 DIAGNOSIS — O134 Gestational [pregnancy-induced] hypertension without significant proteinuria, complicating childbirth: Secondary | ICD-10-CM | POA: Diagnosis not present

## 2020-03-24 DIAGNOSIS — O9962 Diseases of the digestive system complicating childbirth: Secondary | ICD-10-CM | POA: Diagnosis present

## 2020-03-24 DIAGNOSIS — O1002 Pre-existing essential hypertension complicating childbirth: Secondary | ICD-10-CM | POA: Diagnosis present

## 2020-03-24 DIAGNOSIS — Z98891 History of uterine scar from previous surgery: Secondary | ICD-10-CM

## 2020-03-24 DIAGNOSIS — O139 Gestational [pregnancy-induced] hypertension without significant proteinuria, unspecified trimester: Secondary | ICD-10-CM | POA: Diagnosis present

## 2020-03-24 DIAGNOSIS — Z3A36 36 weeks gestation of pregnancy: Secondary | ICD-10-CM

## 2020-03-24 DIAGNOSIS — O9081 Anemia of the puerperium: Secondary | ICD-10-CM | POA: Diagnosis not present

## 2020-03-24 DIAGNOSIS — O34211 Maternal care for low transverse scar from previous cesarean delivery: Secondary | ICD-10-CM | POA: Diagnosis present

## 2020-03-24 DIAGNOSIS — Z8759 Personal history of other complications of pregnancy, childbirth and the puerperium: Secondary | ICD-10-CM | POA: Diagnosis present

## 2020-03-24 DIAGNOSIS — Z20822 Contact with and (suspected) exposure to covid-19: Secondary | ICD-10-CM | POA: Diagnosis present

## 2020-03-24 DIAGNOSIS — D62 Acute posthemorrhagic anemia: Secondary | ICD-10-CM | POA: Diagnosis not present

## 2020-03-24 DIAGNOSIS — K219 Gastro-esophageal reflux disease without esophagitis: Secondary | ICD-10-CM | POA: Diagnosis present

## 2020-03-24 DIAGNOSIS — O10919 Unspecified pre-existing hypertension complicating pregnancy, unspecified trimester: Secondary | ICD-10-CM | POA: Diagnosis present

## 2020-03-24 DIAGNOSIS — O34219 Maternal care for unspecified type scar from previous cesarean delivery: Secondary | ICD-10-CM | POA: Diagnosis not present

## 2020-03-24 LAB — COMPREHENSIVE METABOLIC PANEL
ALT: 14 U/L (ref 0–44)
AST: 17 U/L (ref 15–41)
Albumin: 2.8 g/dL — ABNORMAL LOW (ref 3.5–5.0)
Alkaline Phosphatase: 84 U/L (ref 38–126)
Anion gap: 10 (ref 5–15)
BUN: 13 mg/dL (ref 6–20)
CO2: 21 mmol/L — ABNORMAL LOW (ref 22–32)
Calcium: 10 mg/dL (ref 8.9–10.3)
Chloride: 104 mmol/L (ref 98–111)
Creatinine, Ser: 0.54 mg/dL (ref 0.44–1.00)
GFR calc Af Amer: >60 mL/min (ref >60)
GFR calc non Af Amer: >60 mL/min (ref >60)
Glucose, Bld: 95 mg/dL (ref 70–99)
Potassium: 3.6 mmol/L (ref 3.5–5.1)
Sodium: 135 mmol/L (ref 135–145)
Total Bilirubin: 0.6 mg/dL (ref 0.3–1.2)
Total Protein: 6.3 g/dL — ABNORMAL LOW (ref 6.5–8.1)

## 2020-03-24 LAB — CBC
HCT: 38.4 % (ref 36.0–46.0)
Hemoglobin: 12.8 g/dL (ref 12.0–15.0)
MCH: 31.3 pg (ref 26.0–34.0)
MCHC: 33.3 g/dL (ref 30.0–36.0)
MCV: 93.9 fL (ref 80.0–100.0)
Platelets: 280 10*3/uL (ref 150–400)
RBC: 4.09 MIL/uL (ref 3.87–5.11)
RDW: 13.3 % (ref 11.5–15.5)
WBC: 10.8 10*3/uL — ABNORMAL HIGH (ref 4.0–10.5)
nRBC: 0 % (ref 0.0–0.2)

## 2020-03-24 LAB — RPR: RPR Ser Ql: NONREACTIVE

## 2020-03-24 LAB — TYPE AND SCREEN
ABO/RH(D): A POS
Antibody Screen: NEGATIVE

## 2020-03-24 LAB — PROTEIN / CREATININE RATIO, URINE
Creatinine, Urine: 109.42 mg/dL
Protein Creatinine Ratio: 0.1 mg/mg{Cre} (ref 0.00–0.15)
Total Protein, Urine: 11 mg/dL

## 2020-03-24 LAB — SARS CORONAVIRUS 2 BY RT PCR (HOSPITAL ORDER, PERFORMED IN ~~LOC~~ HOSPITAL LAB): SARS Coronavirus 2: NEGATIVE

## 2020-03-24 SURGERY — Surgical Case
Anesthesia: Spinal | Laterality: Bilateral | Wound class: Clean Contaminated

## 2020-03-24 MED ORDER — SCOPOLAMINE 1 MG/3DAYS TD PT72
1.0000 | MEDICATED_PATCH | TRANSDERMAL | Status: DC
  Administered 2020-03-24: 1.5 mg via TRANSDERMAL
  Filled 2020-03-24: qty 1

## 2020-03-24 MED ORDER — FENTANYL CITRATE (PF) 100 MCG/2ML IJ SOLN
INTRAMUSCULAR | Status: AC
  Filled 2020-03-24: qty 2

## 2020-03-24 MED ORDER — DIPHENHYDRAMINE HCL 50 MG/ML IJ SOLN: 12.5000 mg | INTRAMUSCULAR | Status: DC | PRN

## 2020-03-24 MED ORDER — BUPIVACAINE IN DEXTROSE 0.75-8.25 % IT SOLN
INTRATHECAL | Status: DC | PRN
  Administered 2020-03-24: 1.6 mL via INTRATHECAL

## 2020-03-24 MED ORDER — MORPHINE SULFATE (PF) 0.5 MG/ML IJ SOLN
INTRAMUSCULAR | Status: AC
Start: 2020-03-24 — End: ?
  Filled 2020-03-24: qty 10

## 2020-03-24 MED ORDER — WITCH HAZEL-GLYCERIN EX PADS: 1.0000 "application " | MEDICATED_PAD | CUTANEOUS | Status: DC | PRN

## 2020-03-24 MED ORDER — CEFAZOLIN SODIUM-DEXTROSE 2-4 GM/100ML-% IV SOLN
2.0000 g | INTRAVENOUS | Status: DC
  Filled 2020-03-24: qty 100

## 2020-03-24 MED ORDER — KETOROLAC TROMETHAMINE 30 MG/ML IJ SOLN: 30.0000 mg | Freq: Four times a day (QID) | INTRAMUSCULAR | Status: AC | PRN

## 2020-03-24 MED ORDER — MEPERIDINE HCL 25 MG/ML IJ SOLN: 6.2500 mg | INTRAMUSCULAR | Status: DC | PRN

## 2020-03-24 MED ORDER — POVIDONE-IODINE 10 % EX SWAB: 2.0000 "application " | Freq: Once | CUTANEOUS | Status: DC

## 2020-03-24 MED ORDER — ACETAMINOPHEN 500 MG PO TABS
1000.0000 mg | ORAL_TABLET | Freq: Four times a day (QID) | ORAL | Status: AC
  Administered 2020-03-24 – 2020-03-25 (×3): 1000 mg via ORAL
  Filled 2020-03-24 (×3): qty 2

## 2020-03-24 MED ORDER — CARBOPROST TROMETHAMINE 250 MCG/ML IM SOLN
INTRAMUSCULAR | Status: AC
  Filled 2020-03-24: qty 2

## 2020-03-24 MED ORDER — NALBUPHINE HCL 10 MG/ML IJ SOLN: 5.0000 mg | Freq: Once | INTRAMUSCULAR | Status: DC | PRN

## 2020-03-24 MED ORDER — DEXAMETHASONE SODIUM PHOSPHATE 4 MG/ML IJ SOLN
INTRAMUSCULAR | Status: DC | PRN
  Administered 2020-03-24: 4 mg via INTRAVENOUS

## 2020-03-24 MED ORDER — LACTATED RINGERS IV BOLUS
500.0000 mL | Freq: Once | INTRAVENOUS | Status: AC
  Administered 2020-03-24: 500 mL via INTRAVENOUS

## 2020-03-24 MED ORDER — OXYTOCIN-SODIUM CHLORIDE 30-0.9 UT/500ML-% IV SOLN: 2.5000 [IU]/h | INTRAVENOUS | Status: AC

## 2020-03-24 MED ORDER — KETOROLAC TROMETHAMINE 30 MG/ML IJ SOLN
30.0000 mg | Freq: Four times a day (QID) | INTRAMUSCULAR | Status: AC
  Administered 2020-03-24 – 2020-03-25 (×2): 30 mg via INTRAVENOUS
  Filled 2020-03-24 (×2): qty 1

## 2020-03-24 MED ORDER — OXYTOCIN-SODIUM CHLORIDE 30-0.9 UT/500ML-% IV SOLN
INTRAVENOUS | Status: DC | PRN
  Administered 2020-03-24: 300 mL via INTRAVENOUS

## 2020-03-24 MED ORDER — NALOXONE HCL 4 MG/10ML IJ SOLN
1.0000 ug/kg/h | INTRAVENOUS | Status: DC | PRN
  Filled 2020-03-24: qty 5

## 2020-03-24 MED ORDER — BUPIVACAINE HCL (PF) 0.25 % IJ SOLN
INTRAMUSCULAR | Status: AC
  Filled 2020-03-24: qty 30

## 2020-03-24 MED ORDER — OXYCODONE HCL 5 MG/5ML PO SOLN: 5.0000 mg | Freq: Once | ORAL | Status: DC | PRN

## 2020-03-24 MED ORDER — PANTOPRAZOLE SODIUM 40 MG PO TBEC
40.0000 mg | DELAYED_RELEASE_TABLET | Freq: Every day | ORAL | Status: DC
  Administered 2020-03-24 – 2020-03-26 (×3): 40 mg via ORAL
  Filled 2020-03-24 (×4): qty 1

## 2020-03-24 MED ORDER — DEXTROSE 5 % IV SOLN
INTRAVENOUS | Status: AC
  Filled 2020-03-24: qty 3000

## 2020-03-24 MED ORDER — MIDAZOLAM HCL 2 MG/2ML IJ SOLN
INTRAMUSCULAR | Status: AC
  Filled 2020-03-24: qty 2

## 2020-03-24 MED ORDER — SENNOSIDES-DOCUSATE SODIUM 8.6-50 MG PO TABS
2.0000 | ORAL_TABLET | ORAL | Status: DC
  Administered 2020-03-24 – 2020-03-25 (×2): 2 via ORAL
  Filled 2020-03-24 (×3): qty 2

## 2020-03-24 MED ORDER — PHENYLEPHRINE HCL-NACL 20-0.9 MG/250ML-% IV SOLN
INTRAVENOUS | Status: DC | PRN
  Administered 2020-03-24: 60 ug/min via INTRAVENOUS

## 2020-03-24 MED ORDER — COCONUT OIL OIL: 1.0000 "application " | TOPICAL_OIL | Status: DC | PRN

## 2020-03-24 MED ORDER — SCOPOLAMINE 1 MG/3DAYS TD PT72: 1.0000 | MEDICATED_PATCH | Freq: Once | TRANSDERMAL | Status: DC

## 2020-03-24 MED ORDER — PHENYLEPHRINE HCL-NACL 20-0.9 MG/250ML-% IV SOLN
INTRAVENOUS | Status: AC
  Filled 2020-03-24: qty 250

## 2020-03-24 MED ORDER — SIMETHICONE 80 MG PO CHEW
80.0000 mg | CHEWABLE_TABLET | ORAL | Status: DC
  Administered 2020-03-24 – 2020-03-25 (×2): 80 mg via ORAL
  Filled 2020-03-24 (×2): qty 1

## 2020-03-24 MED ORDER — NALOXONE HCL 0.4 MG/ML IJ SOLN: 0.4000 mg | INTRAMUSCULAR | Status: DC | PRN

## 2020-03-24 MED ORDER — LACTATED RINGERS IV SOLN: INTRAVENOUS | Status: DC

## 2020-03-24 MED ORDER — BUPIVACAINE HCL (PF) 0.25 % IJ SOLN
INTRAMUSCULAR | Status: DC | PRN
  Administered 2020-03-24: 20 mL

## 2020-03-24 MED ORDER — TETANUS-DIPHTH-ACELL PERTUSSIS 5-2.5-18.5 LF-MCG/0.5 IM SUSP: 0.5000 mL | Freq: Once | INTRAMUSCULAR | Status: DC

## 2020-03-24 MED ORDER — ONDANSETRON HCL 4 MG/2ML IJ SOLN
INTRAMUSCULAR | Status: AC
  Filled 2020-03-24: qty 2

## 2020-03-24 MED ORDER — MORPHINE SULFATE (PF) 0.5 MG/ML IJ SOLN
INTRAMUSCULAR | Status: DC | PRN
Start: 2020-03-24 — End: 2020-03-24
  Administered 2020-03-24: .15 mg via EPIDURAL

## 2020-03-24 MED ORDER — OXYCODONE HCL 5 MG PO TABS: 5.0000 mg | ORAL_TABLET | Freq: Once | ORAL | Status: DC | PRN

## 2020-03-24 MED ORDER — FENTANYL CITRATE (PF) 100 MCG/2ML IJ SOLN
INTRAMUSCULAR | Status: DC | PRN
Start: 2020-03-24 — End: 2020-03-24
  Administered 2020-03-24: 15 ug via INTRAVENOUS

## 2020-03-24 MED ORDER — ONDANSETRON HCL 4 MG/2ML IJ SOLN
INTRAMUSCULAR | Status: DC | PRN
  Administered 2020-03-24: 4 mg via INTRAVENOUS

## 2020-03-24 MED ORDER — DIBUCAINE (PERIANAL) 1 % EX OINT: 1.0000 "application " | TOPICAL_OINTMENT | CUTANEOUS | Status: DC | PRN

## 2020-03-24 MED ORDER — KETOROLAC TROMETHAMINE 30 MG/ML IJ SOLN
30.0000 mg | Freq: Once | INTRAMUSCULAR | Status: AC | PRN
  Administered 2020-03-24: 30 mg via INTRAVENOUS

## 2020-03-24 MED ORDER — HYDROMORPHONE HCL 1 MG/ML IJ SOLN: 0.2500 mg | INTRAMUSCULAR | Status: DC | PRN

## 2020-03-24 MED ORDER — NALBUPHINE HCL 10 MG/ML IJ SOLN: 5.0000 mg | INTRAMUSCULAR | Status: DC | PRN

## 2020-03-24 MED ORDER — FAMOTIDINE IN NACL 20-0.9 MG/50ML-% IV SOLN
20.0000 mg | Freq: Once | INTRAVENOUS | Status: AC
  Administered 2020-03-24: 20 mg via INTRAVENOUS
  Filled 2020-03-24: qty 50

## 2020-03-24 MED ORDER — SIMETHICONE 80 MG PO CHEW
80.0000 mg | CHEWABLE_TABLET | Freq: Three times a day (TID) | ORAL | Status: DC
  Administered 2020-03-25 – 2020-03-26 (×3): 80 mg via ORAL
  Filled 2020-03-24 (×3): qty 1

## 2020-03-24 MED ORDER — IBUPROFEN 800 MG PO TABS
800.0000 mg | ORAL_TABLET | Freq: Four times a day (QID) | ORAL | Status: DC
  Administered 2020-03-25 – 2020-03-26 (×5): 800 mg via ORAL
  Filled 2020-03-24 (×5): qty 1

## 2020-03-24 MED ORDER — DIPHENHYDRAMINE HCL 25 MG PO CAPS: 25.0000 mg | ORAL_CAPSULE | ORAL | Status: DC | PRN

## 2020-03-24 MED ORDER — MENTHOL 3 MG MT LOZG: 1.0000 | LOZENGE | OROMUCOSAL | Status: DC | PRN

## 2020-03-24 MED ORDER — DIPHENHYDRAMINE HCL 25 MG PO CAPS: 25.0000 mg | ORAL_CAPSULE | Freq: Four times a day (QID) | ORAL | Status: DC | PRN

## 2020-03-24 MED ORDER — DEXTROSE 5 % IV SOLN
INTRAVENOUS | Status: DC | PRN
  Administered 2020-03-24: 3 g via INTRAVENOUS

## 2020-03-24 MED ORDER — STERILE WATER FOR IRRIGATION IR SOLN
Status: DC | PRN
  Administered 2020-03-24: 1

## 2020-03-24 MED ORDER — LIDOCAINE HCL 1 % IJ SOLN
INTRAMUSCULAR | Status: AC
  Filled 2020-03-24: qty 20

## 2020-03-24 MED ORDER — KETOROLAC TROMETHAMINE 30 MG/ML IJ SOLN
INTRAMUSCULAR | Status: AC
  Filled 2020-03-24: qty 1

## 2020-03-24 MED ORDER — SIMETHICONE 80 MG PO CHEW: 80.0000 mg | CHEWABLE_TABLET | ORAL | Status: DC | PRN

## 2020-03-24 MED ORDER — SODIUM CHLORIDE 0.9 % IR SOLN
Status: DC | PRN
  Administered 2020-03-24: 1

## 2020-03-24 MED ORDER — SOD CITRATE-CITRIC ACID 500-334 MG/5ML PO SOLN
30.0000 mL | Freq: Once | ORAL | Status: AC
  Administered 2020-03-24: 30 mL via ORAL
  Filled 2020-03-24: qty 30

## 2020-03-24 MED ORDER — SODIUM CHLORIDE 0.9% FLUSH: 3.0000 mL | INTRAVENOUS | Status: DC | PRN

## 2020-03-24 MED ORDER — MIDAZOLAM HCL 2 MG/2ML IJ SOLN
INTRAMUSCULAR | Status: DC | PRN
  Administered 2020-03-24 (×2): .5 mg via INTRAVENOUS

## 2020-03-24 MED ORDER — OXYCODONE-ACETAMINOPHEN 5-325 MG PO TABS: 1.0000 | ORAL_TABLET | ORAL | Status: DC | PRN

## 2020-03-24 MED ORDER — ONDANSETRON HCL 4 MG/2ML IJ SOLN: 4.0000 mg | Freq: Three times a day (TID) | INTRAMUSCULAR | Status: DC | PRN

## 2020-03-24 MED ORDER — PRENATAL MULTIVITAMIN CH
1.0000 | ORAL_TABLET | Freq: Every day | ORAL | Status: DC
  Administered 2020-03-24 – 2020-03-26 (×3): 1 via ORAL
  Filled 2020-03-24 (×3): qty 1

## 2020-03-24 MED ORDER — DEXAMETHASONE SODIUM PHOSPHATE 4 MG/ML IJ SOLN
INTRAMUSCULAR | Status: AC
  Filled 2020-03-24: qty 1

## 2020-03-24 MED ORDER — PROMETHAZINE HCL 25 MG/ML IJ SOLN: 6.2500 mg | INTRAMUSCULAR | Status: DC | PRN

## 2020-03-24 SURGICAL SUPPLY — 38 items
APL SKNCLS STERI-STRIP NONHPOA (GAUZE/BANDAGES/DRESSINGS) ×1
BENZOIN TINCTURE PRP APPL 2/3 (GAUZE/BANDAGES/DRESSINGS) ×2 IMPLANT
CLAMP CORD UMBIL (MISCELLANEOUS) IMPLANT
CLOSURE STERI-STRIP 1/4X4 (GAUZE/BANDAGES/DRESSINGS) ×2 IMPLANT
CLOTH BEACON ORANGE TIMEOUT ST (SAFETY) ×2 IMPLANT
DRSG OPSITE POSTOP 4X10 (GAUZE/BANDAGES/DRESSINGS) ×2 IMPLANT
ELECT REM PT RETURN 9FT ADLT (ELECTROSURGICAL) ×2
ELECTRODE REM PT RTRN 9FT ADLT (ELECTROSURGICAL) ×1 IMPLANT
EXTRACTOR VACUUM M CUP 4 TUBE (SUCTIONS) IMPLANT
GLOVE BIO SURGEON STRL SZ7.5 (GLOVE) ×2 IMPLANT
GLOVE BIOGEL PI IND STRL 7.0 (GLOVE) ×1 IMPLANT
GLOVE BIOGEL PI INDICATOR 7.0 (GLOVE) ×1
GOWN STRL REUS W/TWL LRG LVL3 (GOWN DISPOSABLE) ×4 IMPLANT
HEMOSTAT ARISTA ABSORB 3G PWDR (HEMOSTASIS) ×2 IMPLANT
KIT ABG SYR 3ML LUER SLIP (SYRINGE) IMPLANT
NEEDLE HYPO 22GX1.5 SAFETY (NEEDLE) ×4 IMPLANT
NEEDLE HYPO 25X5/8 SAFETYGLIDE (NEEDLE) IMPLANT
NEEDLE SPNL 20GX3.5 QUINCKE YW (NEEDLE) IMPLANT
NS IRRIG 1000ML POUR BTL (IV SOLUTION) ×2 IMPLANT
PACK C SECTION WH (CUSTOM PROCEDURE TRAY) ×2 IMPLANT
PAD OB MATERNITY 4.3X12.25 (PERSONAL CARE ITEMS) ×2 IMPLANT
PREVENA RESTOR AXIOFORM 29X28 (GAUZE/BANDAGES/DRESSINGS) ×2 IMPLANT
SUT MNCRL 0 VIOLET CTX 36 (SUTURE) ×2 IMPLANT
SUT MNCRL AB 3-0 PS2 27 (SUTURE) IMPLANT
SUT MON AB 2-0 CT1 27 (SUTURE) ×2 IMPLANT
SUT MON AB-0 CT1 36 (SUTURE) ×4 IMPLANT
SUT MONOCRYL 0 CTX 36 (SUTURE) ×4
SUT PLAIN 0 NONE (SUTURE) ×2 IMPLANT
SUT PLAIN 2 0 (SUTURE)
SUT PLAIN 2 0 XLH (SUTURE) ×4 IMPLANT
SUT PLAIN ABS 2-0 CT1 27XMFL (SUTURE) IMPLANT
SUT VIC AB 0 CT1 36 (SUTURE) ×2 IMPLANT
SUT VIC AB 4-0 KS 27 (SUTURE) ×2 IMPLANT
SYR 20CC LL (SYRINGE) IMPLANT
SYR CONTROL 10ML LL (SYRINGE) ×4 IMPLANT
TOWEL OR 17X24 6PK STRL BLUE (TOWEL DISPOSABLE) ×2 IMPLANT
TRAY FOLEY W/BAG SLVR 14FR LF (SET/KITS/TRAYS/PACK) ×2 IMPLANT
WATER STERILE IRR 1000ML POUR (IV SOLUTION) ×2 IMPLANT

## 2020-03-24 NOTE — Lactation Note (Signed)
This note was copied from a baby's chart. Lactation Consultation Note  Patient Name: Alisha Wood [id number] Date: 03/24/2020 Reason for consult: Initial assessment;Late-preterm 34-36.6wks   P2 mom.  Attempted to BF first child but was readmitted for pre eclampsia and her milk supply was gone per mom.    Mom demonstrated hand expression and drops were seen.  Collected 46ml into spoon and finger fed to infant.  Infant had just fed 13 minutes on right side.  LC offered to assist on left side.  Infant was placed in cross cradle.  Parents instructed on teacup hold; mom's breasts need support on the side and underneath so cloths were rolled and placed for added support.  Infant latched easily with flanged lips. Rhythmic sucking noted.  Mom's nipples appeared slightly compressed after 10 minutes of feeding when infant came off the breast.  Dad and mom were able to get infant relatched well.  LC strongly suggested using support pillows and rolled blankets for added infant support.    Infant fed 10 more minutes.  LC suggested doing plenty of STS.    LPTI guidelines reviewed with family.  Supplementation guidelines reviewed and pump explained.  RN will set up pump.  LC recommended pumping every 2-3 hours or after BF and for family to spoon feed back EBM to infant.    Lactation brochure provided and family is aware of BFSG, lactation phone line, and Out patient [place] appt. Which LC highly recommended to the family.     Maternal Data Has patient been taught Hand Expression?: Yes Does the patient have breastfeeding experience prior to this delivery?: No (she attempted with her first but was readmitted,put on lasix, and lost her milk supply per mom)  Feeding Feeding Type: Breast Fed  LATCH Score Latch: Repeated attempts needed to sustain latch, nipple held in mouth throughout feeding, stimulation needed to elicit sucking reflex.  Audible Swallowing: Spontaneous and intermittent  Type of Nipple:  Flat  Comfort (Breast/Nipple): Soft / non-tender  Hold (Positioning): Assistance needed to correctly position infant at breast and maintain latch.  LATCH Score: 7  Interventions Interventions: Breast feeding basics reviewed;Assisted with latch;Skin to skin;Breast massage;Hand express;Position options;Support pillows;Adjust position;Breast compression;DEBP  Lactation Tools Discussed/Used WIC Program: No Pump Review:  (LC reviewed usage, frequency of pumping:  RN to set up pump) Date initiated:: 03/24/20   Consult Status Consult Status: Follow-up Date: 03/25/20 Follow-up type: In-patient    Ferne Coe [facility] 03/24/2020, 5:42 PM

## 2020-03-24 NOTE — MAU Note (Signed)
Report given to [facility] OR charge nurse and OB anesthesia notified.

## 2020-03-24 NOTE — Op Note (Addendum)
Cesarean Section Procedure Note   Alisha Wood  03/24/2020  Indications: SROM, h/o prior c/s and wants repeat   Pre-operative Diagnosis: Previous Cesarean Section, Gestational Hypertension, SROM, meconium fluid; morbid obesity   Post-operative Diagnosis: Same   Surgeon: Surgeon(s) and Role:  Lema Heinkel, Earlyne Iba, MD - Primary   Assistants: Gavin Potters, CNM  Anesthesia: epidural   Procedure Details:  The patient was seen in the Holding Room. The risks, benefits, complications, treatment options, and expected outcomes were discussed with the patient. The patient concurred with the proposed plan, giving informed consent. identified as Alisha Wood and the procedure verified as C-Section Delivery. A Time Out was held and the above information confirmed.  After induction of anesthesia, the patient was draped and prepped in the usual sterile manner, foley was draining urine well.  A pfannenstiel incision was made and carried down through the subcutaneous tissue to the fascia. Fascial incision was made and extended transversely. The fascia was separated from the underlying rectus tissue superiorly and inferiorly. The peritoneum was identified and entered. Peritoneal incision was extended longitudinally. Alexis-O retractor placed. The utero-vesical peritoneal reflection was incised transversely and the bladder flap was bluntly freed from the lower uterine segment. A low transverse uterine incision was made. Delivered from cephalic presentation was a viable female infant with vigorous cry. After delivery of fetal head, there was a loose nuchal x1 that was easily reduced. Apgar scores of 8 at one minute and 10 at five minutes. Delayed cord clamping done at 1 minute and baby handed to NICU team in attendance. Cord ph was not sent. Cord blood was obtained for evaluation. The placenta was removed Intact, spontaneously and appeared normal. The uterine outline, tubes and ovaries appeared normal}. The  uterine incision was closed with running locked sutures of 64monocryl. A second imbricating layer sutured.   Hemostasis was observed and maintained after placing arista on uterine incision and LUS. Alexis retractor removed. Peritoneal closure done with 2-0 Vicryl.  The fascia was then reapproximated with running sutures of 0Vicryl. The subcuticular closure was performed using 2-0plain gut. The skin was closed with 4-0Vicryl.   Instrument, sponge, and needle counts were correct prior the abdominal closure and were correct at the conclusion of the case.    Findings:viable female infant, apgars 8/9; normal appearing uterus/tubes and ov bilat   Estimated Blood Loss: 549ml   Total IV Fluids: 1532ml   Urine Output: 4108mlCC OF clear urine  Specimens: none   Complications: no complications  Disposition: PACU - hemodynamically stable.   Maternal Condition: stable   Baby condition / location:  Couplet care / Skin to Skin  Attending Attestation: I performed the procedure.   Signed: Surgeon(s): Brien Few, MD Charyl Bigger, MD

## 2020-03-24 NOTE — MAU Note (Signed)
G2P1 arrived with complaints of SROM green tinged fluid at 0400. Reports last fetal movement at 0230. Pt has an [place] of 04/16/2020. Pt denies vaginal bleeding or bloody show. Instructed to change into pt gown and obtain urine sample. Pt reports last food was at 0100. Pt reports contractions began 20 min after SROM

## 2020-03-24 NOTE — Anesthesia Procedure Notes (Addendum)
Spinal  Patient location during procedure: OR Start time: 03/24/2020 7:54 AM End time: 03/24/2020 7:59 AM Staffing Performed: anesthesiologist  Anesthesiologist: Nolon Nations, MD Preanesthetic Checklist Completed: patient identified, IV checked, site marked, risks and benefits discussed, surgical consent, monitors and equipment checked, pre-op evaluation and timeout performed Spinal Block Patient position: sitting Prep: DuraPrep and site prepped and draped Patient monitoring: heart rate, continuous pulse ox and blood pressure Approach: midline Location: L3-4 Injection technique: single-shot Needle Needle type: Sprotte  Needle gauge: 24 G Needle length: 9 cm Additional Notes Expiration date of kit checked and confirmed. Patient tolerated procedure well, without complications.

## 2020-03-24 NOTE — MAU Note (Signed)
Patient reports water broke at 0400, meconium. She reports irregular contractions. Felt baby move last night at bed time but has not felt baby move since water broke. Scheduled for repeat c/s on 9/8.

## 2020-03-24 NOTE — Anesthesia Preprocedure Evaluation (Addendum)
Anesthesia Evaluation  Patient identified by MRN, date of birth, ID band Patient awake    Reviewed: Allergy & Precautions, NPO status , Patient's Chart, lab work & pertinent test results  History of Anesthesia Complications (+) PONV and history of anesthetic complications (PONV with last c section and GA)  Airway Mallampati: II  TM Distance: >3 FB Neck ROM: Full    Dental no notable dental hx. (+) Teeth Intact, Caps,    Pulmonary neg pulmonary ROS,    Pulmonary exam normal breath sounds clear to auscultation       Cardiovascular hypertension (PIH), Normal cardiovascular exam Rhythm:Regular Rate:Normal     Neuro/Psych  Headaches, negative psych ROS   GI/Hepatic Neg liver ROS, GERD  Medicated and Controlled,  Endo/Other  diabetes (diet controlled GDMA), Well Controlled, GestationalMorbid obesityBMI 51  Renal/GU negative Renal ROS  negative genitourinary   Musculoskeletal negative musculoskeletal ROS (+)   Abdominal (+) + obese,   Peds negative pediatric ROS (+)  Hematology negative hematology ROS (+) hct 38.4, plt 280   Anesthesia Other Findings   Reproductive/Obstetrics (+) Pregnancy SROM at home + meconium, contracting  Prior c section 2016- labored x 24h then sectioned for failure to progress, epidural worked well Desires sterilization                            Anesthesia Physical Anesthesia Plan  ASA: III  Anesthesia Plan: Spinal   Post-op Pain Management:    Induction:   PONV Risk Score and Plan: 2 and Scopolamine patch - Pre-op, Ondansetron, Dexamethasone, Treatment may vary due to age or medical condition and Metaclopromide  Airway Management Planned: Natural Airway and Nasal Cannula  Additional Equipment: None  Intra-op Plan:   Post-operative Plan:   Informed Consent: I have reviewed the patients History and Physical, chart, labs and discussed the procedure including  the risks, benefits and alternatives for the proposed anesthesia with the patient or authorized representative who has indicated his/her understanding and acceptance.       Plan Discussed with: CRNA  Anesthesia Plan Comments:        Anesthesia Quick Evaluation
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
## 2020-03-24 NOTE — Anesthesia Postprocedure Evaluation (Signed)
Anesthesia Post Note  Patient: Alisha Wood  Procedure(s) Performed: Repeat CESAREAN SECTION (Bilateral )     Patient location during evaluation: PACU Anesthesia Type: Spinal Level of consciousness: oriented and awake and alert Pain management: pain level controlled Vital Signs Assessment: post-procedure vital signs reviewed and stable Respiratory status: spontaneous breathing, respiratory function stable and patient connected to nasal cannula oxygen Cardiovascular status: blood pressure returned to baseline and stable Postop Assessment: no headache, no backache and no apparent nausea or vomiting Anesthetic complications: no   No complications documented.  Last Vitals:  Vitals:   03/24/20 1400 03/24/20 1500  BP: 109/65 120/60  Pulse: 61 65  Resp: 17 17  Temp: 36.9 C   SpO2: 97% 97%    Last Pain:  Vitals:   03/24/20 1500  TempSrc:   PainSc: 2    Pain Goal:                   Nolon Nations

## 2020-03-24 NOTE — Transfer of Care (Signed)
Immediate Anesthesia Transfer of Care Note  Patient: Alisha Wood  Procedure(s) Performed: Repeat CESAREAN SECTION (Bilateral )  Patient Location: PACU  Anesthesia Type:Spinal  Level of Consciousness: awake  Airway & Oxygen Therapy: Patient Spontanous Breathing  Post-op Assessment: Report given to RN and Post -op Vital signs reviewed and stable  Post vital signs: Reviewed and stable  Last Vitals:  Vitals Value Taken Time  BP    Temp    Pulse 67 03/24/20 0956  Resp 19 03/24/20 0956  SpO2 99 % 03/24/20 0956  Vitals shown include unvalidated device data.  Last Pain:  Vitals:   03/24/20 0511  TempSrc:   PainSc: 3          Complications: No complications documented.

## 2020-03-25 LAB — CBC
HCT: 35.3 % — ABNORMAL LOW (ref 36.0–46.0)
Hemoglobin: 11 g/dL — ABNORMAL LOW (ref 12.0–15.0)
MCH: 32.3 pg (ref 26.0–34.0)
MCHC: 31.2 g/dL (ref 30.0–36.0)
MCV: 103.5 fL — ABNORMAL HIGH (ref 80.0–100.0)
Platelets: 123 10*3/uL — ABNORMAL LOW (ref 150–400)
RBC: 3.41 MIL/uL — ABNORMAL LOW (ref 3.87–5.11)
RDW: 13.6 % (ref 11.5–15.5)
WBC: 12.5 10*3/uL — ABNORMAL HIGH (ref 4.0–10.5)
nRBC: 0 % (ref 0.0–0.2)

## 2020-03-25 NOTE — Lactation Note (Signed)
This note was copied from a baby's chart. Lactation Consultation Note  Infant is 55 hours old 36 weeks 6 days. LC received report to f/u with Mom to see how she is doing and offer assistance. [place] spoke with Mom and she stated she was giving formula at this time and did not require any LC service at this time.

## 2020-03-25 NOTE — Lactation Note (Addendum)
This note was copied from a baby's chart. Lactation Consultation Note  Patient Name: Alisha Wood [name] Date: 03/25/2020 Reason for consult: Follow-up assessment;1st time breastfeeding;Late-preterm 34-36.6wks;Difficult latch 34 29hrs old, wt loss 2.5%, mom sitting on couch, baby asleep in bassinet, dad sitting in chair. Mom reports baby last fed ~9am, recently circumcised, reports difficulty latching baby, states baby received formula over night d/t difficulty latching. Mom with flat nipples bruised bilat, wide spacing noted between breasts, reports noting increase in nipple size and areola darkening during pregnancy, denies noting increase in breast size. Mom reports with first baby having difficulty latching, unsuccessful with using nipple shield, discontinued breastfeeding attempts/ pumping d/t readmission for pre-eclampsia. Mom states her goal with this baby is to breastfeed and is ok if supplementation with formula needed. Assisted mom with latching baby to left breast football hold, LC latched baby using teacup hold few swallows noted, mom unable to do on her own and baby unable to maintain latch. Mom fitted for 56mm nipple shield, latched baby on own, audible swallows noted with stimulation, baby maintained latch. BF Supplementation sheet given, discussed SNS vs bottle feeding, mom prefers bottle feed with formula at this time, extra slow flow nipple given. Reinforced cue based feedings, wake if >3hrs since last feeding, skin to skin, pump with DEBP after each feeding, offer EBM back to baby, paced bottle feeding, avoid feedings >6mins. Mom aware to call for [facility] or RN support if with difficulty applying nipple shield. Mom voiced understanding and with no further concerns. Left the room with mom bottle feeding baby. Celene Kras, RN, IBCLC  Addendum: [employer] employee pump given. BGilliam, RN, IBCLC  Maternal Data    Feeding Feeding Type: Breast Fed  LATCH Score Latch: Grasps breast  easily, tongue down, lips flanged, rhythmical sucking.  Audible Swallowing: A few with stimulation  Type of Nipple: Flat  Comfort (Breast/Nipple): Filling, red/small blisters or bruises, mild/mod discomfort  Hold (Positioning): No assistance needed to correctly position infant at breast.  LATCH Score: 7  Interventions Interventions: Breast feeding basics reviewed;Assisted with latch;Skin to skin;Breast massage;Hand express;Breast compression;Adjust position;Support pillows;Expressed milk;DEBP  Lactation Tools Discussed/Used Tools: Nipple [name];Bottle (90mm, bottle with extra slow flow nipple)   Consult Status Consult Status: Follow-up Date: 03/26/20 Follow-up type: In-patient    [name] 03/25/2020, 2:23 PM

## 2020-03-25 NOTE — Progress Notes (Signed)
No c/o; tol po, voiding w/o difficulty, ambulating, +flatus, pain controlled Wants circ  Patient Vitals for the past 24 hrs:  BP Temp Temp src Pulse Resp SpO2  03/25/20 0727 113/65 98.5 F (36.9 C) Oral 62 16 98 %  03/25/20 0546 (!) 111/59 98.1 F (36.7 C) Oral (!) 53 15 100 %  03/25/20 0300 -- -- -- -- 17 100 %  03/25/20 0046 -- -- -- -- 16 100 %  03/25/20 0014 -- -- -- -- -- 100 %  03/24/20 2340 -- -- Oral -- 15 100 %  03/24/20 2034 (!) 103/53 98.2 F (36.8 C) Oral (!) 56 16 100 %  03/24/20 1927 -- -- -- -- 18 97 %  03/24/20 1630 120/60 99.5 F (37.5 C) Oral 63 19 98 %  03/24/20 1500 120/60 -- -- 65 17 97 %  03/24/20 1400 109/65 98.5 F (36.9 C) Oral 61 17 97 %  03/24/20 1236 124/65 98.6 F (37 C) Oral (!) 55 18 97 %  03/24/20 1145 100/62 97.8 F (36.6 C) Oral 62 13 99 %  03/24/20 1130 (!) 95/52 -- -- 68 14 98 %  03/24/20 1123 -- 97.8 F (36.6 C) Oral (!) 58 19 98 %  03/24/20 1115 (!) 85/54 (!) 97 F (36.1 C) Axillary 68 11 --  03/24/20 1045 (!) 88/52 -- -- 60 18 --  03/24/20 1030 (!) 96/55 98.4 F (36.9 C) Oral 66 11 99 %  03/24/20 1015 (!) 89/49 98.3 F (36.8 C) Oral 70 17 99 %  03/24/20 1005 -- -- -- 66 13 99 %  03/24/20 1000 (!) 100/58 -- -- 69 14 98 %  03/24/20 0956 (!) 98/56 -- -- 67 19 99 %    Intake/Output Summary (Last 24 hours) at 03/25/2020 0951 Last data filed at 03/25/2020 0825 Gross per 24 hour  Intake 437.5 ml  Output 2675 ml  Net -2237.5 ml   A&ox3 rrr ctab Abd: soft, nt, nd, +bs; dressing: c/d/i; fundus firm  LE: tr edema, nt bilat   CBC Latest Ref Rng & Units 03/25/2020 03/24/2020 03/28/2019  WBC 4.0 - 10.5 K/uL 12.5(H) 10.8(H) -  Hemoglobin 12.0 - 15.0 g/dL 11.0(L) 12.8 15.0  Hematocrit 36 - 46 % 35.3(L) 38.4 44.0  Platelets 150 - 400 K/uL 123(L) 280 -    A/P: POD 1 s/p rltcs 1. Doing well - contin current care 2. ghtn - wnl, follow closely 3. Acute anemia d/t blood loss - asymptomatic, iron q day pp

## 2020-03-25 NOTE — Progress Notes (Signed)
Pt states IV sore and uncomfortable; IV removed per pt request.

## 2020-03-26 ENCOUNTER — Other Ambulatory Visit (HOSPITAL_COMMUNITY)
Admission: RE | Admit: 2020-03-26 | Discharge: 2020-03-26 | Disposition: A | Discharge status: Home / Self Care | Payer: 59 | Source: Ambulatory Visit | Attending: Obstetrics and Gynecology | Admitting: Obstetrics and Gynecology

## 2020-03-26 HISTORY — DX: Nausea with vomiting, unspecified: R11.2

## 2020-03-26 HISTORY — DX: Other specified postprocedural states: R11.2

## 2020-03-26 HISTORY — DX: Other specified postprocedural states: Z98.890

## 2020-03-26 LAB — BIRTH TISSUE RECOVERY COLLECTION (PLACENTA DONATION)

## 2020-03-26 MED ORDER — SIMETHICONE 80 MG PO CHEW: 80.0000 mg | CHEWABLE_TABLET | ORAL | 0 refills | Status: DC | PRN

## 2020-03-26 MED ORDER — ACETAMINOPHEN 500 MG PO TABS: 1000.0000 mg | ORAL_TABLET | Freq: Four times a day (QID) | ORAL | 2 refills | Status: DC | PRN

## 2020-03-26 MED ORDER — COCONUT OIL OIL: 1.0000 "application " | TOPICAL_OIL | 0 refills | Status: DC | PRN

## 2020-03-26 MED ORDER — OXYCODONE HCL 5 MG PO TABS: 5.0000 mg | ORAL_TABLET | Freq: Three times a day (TID) | ORAL | 0 refills | Status: DC | PRN

## 2020-03-26 MED ORDER — SENNOSIDES-DOCUSATE SODIUM 8.6-50 MG PO TABS
2.0000 | ORAL_TABLET | ORAL | Status: DC
Start: 2020-03-27 — End: 2020-05-18

## 2020-03-26 MED ORDER — IBUPROFEN 800 MG PO TABS
800.0000 mg | ORAL_TABLET | Freq: Four times a day (QID) | ORAL | 0 refills | Status: DC
Start: 2020-03-26 — End: 2020-05-18

## 2020-03-26 NOTE — Discharge Summary (Signed)
OB Discharge Summary  Patient Name: Alisha Wood DOB: Apr 10, 1986 MRN: [id number]  Date of admission: 03/24/2020 Delivering provider: Tiana Loft E   Admitting diagnosis: Gestational hypertension [O13.9] Intrauterine pregnancy: [redacted]w[redacted]d     Secondary diagnosis: Patient Active Problem List   Diagnosis Date Noted  . Status post repeat low transverse cesarean section 9/4 03/24/2020  . Postpartum care following cesarean delivery 9/4 03/24/2020  . Chronic hypertension affecting pregnancy 02/08/2015  . Morbid obesity with BMI of 50.0-59.9, adult ([facility]) 08/30/2012   Additional problems:none   Date of discharge: 03/26/2020   Discharge diagnosis: Principal Problem:   Postpartum care following cesarean delivery 9/4 Active Problems:   Morbid obesity with BMI of 50.0-59.9, adult ([facility])   Chronic hypertension affecting pregnancy   Status post repeat low transverse cesarean section 9/4                                                              Post partum procedures:none  Augmentation: N/A Pain control: Spinal  Laceration:None  Episiotomy:None  Complications: None  Hospital course:  Sceduled C/S   34 y.o. yo [id number] at [redacted]w[redacted]d was admitted to the hospital 03/24/2020 for scheduled cesarean section with the following indication:Elective Repeat and presented early with spontaneous rupture of membranes.Delivery details are as follows:  Membrane Rupture Time/Date: 4:00 AM ,03/24/2020   Delivery Method:C-Section, Low Transverse  Details of operation can be found in separate operative note.  Patient had an uncomplicated postpartum course.  She is ambulating, tolerating a regular diet, passing flatus, and urinating well. Patient is discharged home in stable condition on  03/26/20        Newborn Data: Birth date:03/24/2020  Birth time:8:49 AM  Gender:Female  Living status:Living  Apgars:9 ,10  Weight:2903 g     Physical exam  Vitals:   03/25/20 1510 03/25/20 1933 03/25/20 2053 03/26/20 0529  BP:  (!) 121/58 (!) 122/56 118/60 125/69  Pulse: 68 75 71 67  Resp: 20 18 19 18   Temp: 98.4 F (36.9 C) 98.3 F (36.8 C) 98.1 F (36.7 C) 98.4 F (36.9 C)  TempSrc: Oral Oral  Oral  SpO2: 98% 100% 99% 100%  Weight:      Height:       General: alert, cooperative and no distress Lochia: appropriate Uterine Fundus: firm Incision: Previna dressing intact and patent DVT Evaluation: No cords or calf tenderness. No significant calf/ankle edema. Labs: Lab Results  Component Value Date   WBC 12.5 (H) 03/25/2020   HGB 11.0 (L) 03/25/2020   HCT 35.3 (L) 03/25/2020   MCV 103.5 (H) 03/25/2020   PLT 123 (L) 03/25/2020   CMP Latest Ref Rng & Units 03/24/2020  Glucose 70 - 99 mg/dL 95  BUN 6 - 20 mg/dL 13  Creatinine 0.44 - 1.00 mg/dL 0.54  Sodium 135 - 145 mmol/L 135  Potassium 3.5 - 5.1 mmol/L 3.6  Chloride 98 - 111 mmol/L 104  CO2 22 - 32 mmol/L 21(L)  Calcium 8.9 - 10.3 mg/dL 10.0  Total Protein 6.5 - 8.1 g/dL 6.3(L)  Total Bilirubin 0.3 - 1.2 mg/dL 0.6  Alkaline Phos 38 - 126 U/L 84  AST 15 - 41 U/L 17  ALT 0 - 44 U/L 14   Edinburgh Postnatal Depression Scale Screening Tool 03/25/2020 03/24/2020  I have  been able to laugh and see the funny side of things. (No Data) (No Data)     Covid vaccine Pfeizer 1st dose given prior to DC  Discharge instruction:  per After Visit Summary,  [facility] booklet and  "Understanding Mother [facility] Baby Care" hospital booklet  After Visit Meds:  Allergies as of 03/26/2020      Reactions   Other Anaphylaxis   Seafood allergy   Wellbutrin [bupropion]    Dizziness, Tunnel vision, Fuzzy head   Metformin And Related Other (See Comments)   Woozy feeling, headaches, fatigue   Sulfa Antibiotics Rash      Medication List    STOP taking these medications   aspirin EC 81 MG tablet     TAKE these medications   acetaminophen 500 MG tablet Commonly known as: TYLENOL Take 2 tablets (1,000 mg total) by mouth every 6 (six) hours as needed.   coconut oil  Oil Apply 1 application topically as needed.   EPINEPHrine 0.3 mg/0.3 mL Soaj injection Commonly known as: EPI-PEN Inject 0.3 mLs (0.3 mg total) into the muscle as needed for anaphylaxis.   ibuprofen 800 MG tablet Commonly known as: ADVIL Take 1 tablet (800 mg total) by mouth every 6 (six) hours.   oxyCODONE 5 MG immediate release tablet Commonly known as: Roxicodone Take 1 tablet (5 mg total) by mouth every 8 (eight) hours as needed.   pantoprazole 40 MG tablet Commonly known as: PROTONIX Take 40 mg by mouth daily.   PRENATAL PO Take 1 tablet by mouth daily.   senna-docusate 8.6-50 MG tablet Commonly known as: Senokot-S Take 2 tablets by mouth daily. Start taking on: March 27, 2020   simethicone 80 MG chewable tablet Commonly known as: MYLICON Chew 1 tablet (80 mg total) by mouth as needed for flatulence.       Diet: routine diet  Activity: Advance as tolerated. Pelvic rest for 6 weeks.   Postpartum contraception: Not Discussed  Newborn Data: Live born female  Birth Weight: 6 lb 6.4 oz (2903 g) APGAR: 55, 10  Newborn Delivery   Birth date/time: 03/24/2020 08:49:00 Delivery type: C-Section, Low Transverse Trial of labor: No C-section categorization: Repeat      named Hart Carwin Baby Feeding: Bottle and Breast Disposition:home with mother   Delivery Report:  Review the Delivery Report for details.    Follow up:  Follow-up Information    Brien Few, MD. Schedule an appointment as soon as possible for a visit in 1 week(s).   Specialty: Obstetrics and Gynecology Why: BP and incision check Contact information: [address] [place] [address] [phone]                 Signed: Juliene Pina, CNM, MSN 03/26/2020, 11:55 AM

## 2020-03-26 NOTE — Discharge Instructions (Signed)
Lactation outpatient support - home visit   Scherry Ran RN, MHA, IBCLC at [employer]: Lactation Consultant  [email or web address]

## 2020-03-26 NOTE — Progress Notes (Signed)
CSW received and acknowledges consult for EDPS of 9.  Consult screened out due to 9 on EDPS does not warrant a CSW consult.  MOB whom scores are greater than 9/yes to question 10 on Edinburgh Postpartum Depression Screen warrants a CSW consult.    Alisha Wood Alisha Wood, MSW, LCSW [facility] [place] [phone]

## 2020-03-27 ENCOUNTER — Other Ambulatory Visit: Payer: Self-pay

## 2020-03-27 ENCOUNTER — Inpatient Hospital Stay (HOSPITAL_COMMUNITY)
Admission: AD | Admit: 2020-03-27 | Discharge: 2020-03-27 | Disposition: A | Discharge status: Home / Self Care | Payer: 59 | Source: Home / Self Care | Attending: Obstetrics and Gynecology | Admitting: Obstetrics and Gynecology

## 2020-03-27 ENCOUNTER — Encounter (HOSPITAL_COMMUNITY): Payer: Self-pay | Admitting: Obstetrics and Gynecology

## 2020-03-27 ENCOUNTER — Inpatient Hospital Stay (HOSPITAL_COMMUNITY): Payer: 59

## 2020-03-27 ENCOUNTER — Inpatient Hospital Stay (HOSPITAL_COMMUNITY)
Admission: AD | Admit: 2020-03-27 | Discharge: 2020-03-27 | Disposition: A | Discharge status: Home / Self Care | Payer: 59 | Attending: Obstetrics and Gynecology | Admitting: Obstetrics and Gynecology

## 2020-03-27 DIAGNOSIS — M7989 Other specified soft tissue disorders: Secondary | ICD-10-CM | POA: Diagnosis not present

## 2020-03-27 DIAGNOSIS — Z79899 Other long term (current) drug therapy: Secondary | ICD-10-CM | POA: Insufficient documentation

## 2020-03-27 DIAGNOSIS — M4802 Spinal stenosis, cervical region: Secondary | ICD-10-CM | POA: Insufficient documentation

## 2020-03-27 DIAGNOSIS — O99893 Other specified diseases and conditions complicating puerperium: Secondary | ICD-10-CM

## 2020-03-27 DIAGNOSIS — R531 Weakness: Secondary | ICD-10-CM | POA: Diagnosis not present

## 2020-03-27 DIAGNOSIS — M5126 Other intervertebral disc displacement, lumbar region: Secondary | ICD-10-CM | POA: Diagnosis not present

## 2020-03-27 DIAGNOSIS — R2231 Localized swelling, mass and lump, right upper limb: Secondary | ICD-10-CM

## 2020-03-27 DIAGNOSIS — O1093 Unspecified pre-existing hypertension complicating the puerperium: Secondary | ICD-10-CM | POA: Insufficient documentation

## 2020-03-27 DIAGNOSIS — O165 Unspecified maternal hypertension, complicating the puerperium: Secondary | ICD-10-CM

## 2020-03-27 DIAGNOSIS — R0600 Dyspnea, unspecified: Secondary | ICD-10-CM | POA: Diagnosis not present

## 2020-03-27 DIAGNOSIS — J90 Pleural effusion, not elsewhere classified: Secondary | ICD-10-CM | POA: Diagnosis not present

## 2020-03-27 DIAGNOSIS — R079 Chest pain, unspecified: Secondary | ICD-10-CM | POA: Insufficient documentation

## 2020-03-27 DIAGNOSIS — M48061 Spinal stenosis, lumbar region without neurogenic claudication: Secondary | ICD-10-CM | POA: Insufficient documentation

## 2020-03-27 DIAGNOSIS — Z98891 History of uterine scar from previous surgery: Secondary | ICD-10-CM

## 2020-03-27 DIAGNOSIS — M5134 Other intervertebral disc degeneration, thoracic region: Secondary | ICD-10-CM | POA: Diagnosis not present

## 2020-03-27 DIAGNOSIS — O10919 Unspecified pre-existing hypertension complicating pregnancy, unspecified trimester: Secondary | ICD-10-CM

## 2020-03-27 DIAGNOSIS — O9089 Other complications of the puerperium, not elsewhere classified: Secondary | ICD-10-CM | POA: Insufficient documentation

## 2020-03-27 DIAGNOSIS — M50223 Other cervical disc displacement at C6-C7 level: Secondary | ICD-10-CM | POA: Diagnosis not present

## 2020-03-27 DIAGNOSIS — M5127 Other intervertebral disc displacement, lumbosacral region: Secondary | ICD-10-CM | POA: Insufficient documentation

## 2020-03-27 DIAGNOSIS — R5383 Other fatigue: Secondary | ICD-10-CM | POA: Diagnosis not present

## 2020-03-27 DIAGNOSIS — D1809 Hemangioma of other sites: Secondary | ICD-10-CM | POA: Diagnosis not present

## 2020-03-27 DIAGNOSIS — M50221 Other cervical disc displacement at C4-C5 level: Secondary | ICD-10-CM | POA: Diagnosis not present

## 2020-03-27 DIAGNOSIS — R0789 Other chest pain: Secondary | ICD-10-CM

## 2020-03-27 LAB — COMPREHENSIVE METABOLIC PANEL
ALT: 14 U/L (ref 0–44)
ALT: 17 U/L (ref 0–44)
AST: 18 U/L (ref 15–41)
AST: 22 U/L (ref 15–41)
Albumin: 2.6 g/dL — ABNORMAL LOW (ref 3.5–5.0)
Albumin: 2.8 g/dL — ABNORMAL LOW (ref 3.5–5.0)
Alkaline Phosphatase: 64 U/L (ref 38–126)
Alkaline Phosphatase: 68 U/L (ref 38–126)
Anion gap: 11 (ref 5–15)
Anion gap: 12 (ref 5–15)
BUN: 13 mg/dL (ref 6–20)
BUN: 13 mg/dL (ref 6–20)
CO2: 24 mmol/L (ref 22–32)
CO2: 25 mmol/L (ref 22–32)
Calcium: 9.4 mg/dL (ref 8.9–10.3)
Calcium: 9.4 mg/dL (ref 8.9–10.3)
Chloride: 104 mmol/L (ref 98–111)
Chloride: 105 mmol/L (ref 98–111)
Creatinine, Ser: 0.58 mg/dL (ref 0.44–1.00)
Creatinine, Ser: 0.6 mg/dL (ref 0.44–1.00)
GFR calc Af Amer: >60 mL/min (ref >60)
GFR calc Af Amer: >60 mL/min (ref >60)
GFR calc non Af Amer: >60 mL/min (ref >60)
GFR calc non Af Amer: >60 mL/min (ref >60)
Glucose, Bld: 101 mg/dL — ABNORMAL HIGH (ref 70–99)
Glucose, Bld: 85 mg/dL (ref 70–99)
Potassium: 3.9 mmol/L (ref 3.5–5.1)
Potassium: 3.9 mmol/L (ref 3.5–5.1)
Sodium: 139 mmol/L (ref 135–145)
Sodium: 142 mmol/L (ref 135–145)
Total Bilirubin: 0.4 mg/dL (ref 0.3–1.2)
Total Bilirubin: 0.9 mg/dL (ref 0.3–1.2)
Total Protein: 6 g/dL — ABNORMAL LOW (ref 6.5–8.1)
Total Protein: 6.4 g/dL — ABNORMAL LOW (ref 6.5–8.1)

## 2020-03-27 LAB — CBC
HCT: 34.1 % — ABNORMAL LOW (ref 36.0–46.0)
HCT: 37.7 % (ref 36.0–46.0)
Hemoglobin: 11 g/dL — ABNORMAL LOW (ref 12.0–15.0)
Hemoglobin: 12.3 g/dL (ref 12.0–15.0)
MCH: 31.3 pg (ref 26.0–34.0)
MCH: 31.1 pg (ref 26.0–34.0)
MCHC: 32.3 g/dL (ref 30.0–36.0)
MCHC: 32.6 g/dL (ref 30.0–36.0)
MCV: 96.9 fL (ref 80.0–100.0)
MCV: 95.2 fL (ref 80.0–100.0)
Platelets: 243 10*3/uL (ref 150–400)
Platelets: 291 10*3/uL (ref 150–400)
RBC: 3.52 MIL/uL — ABNORMAL LOW (ref 3.87–5.11)
RBC: 3.96 MIL/uL (ref 3.87–5.11)
RDW: 13.8 % (ref 11.5–15.5)
RDW: 13.6 % (ref 11.5–15.5)
WBC: 10.2 10*3/uL (ref 4.0–10.5)
WBC: 9 10*3/uL (ref 4.0–10.5)
nRBC: 0 % (ref 0.0–0.2)
nRBC: 0 % (ref 0.0–0.2)

## 2020-03-27 LAB — BRAIN NATRIURETIC PEPTIDE: B Natriuretic Peptide: 83 pg/mL (ref 0.0–100.0)

## 2020-03-27 LAB — TROPONIN I (HIGH SENSITIVITY): Troponin I (High Sensitivity): 3 ng/L (ref <18)

## 2020-03-27 IMAGING — MR MR CERVICAL SPINE W/O CM
4 of 6 series · 32 of 48 positions shown · non-contrast
Comparison: None.

CLINICAL DATA: Bilateral leg weakness. Clinical concern for
hematoma after spinal anesthesia for Cesarian section [DATE]

EXAM:
MRI CERVICAL SPINE WITHOUT CONTRAST
TECHNIQUE: Multiplanar, multisequence MR imaging of the cervical spine was
performed. No intravenous contrast was administered. Patient
declined IV contrast.

[Series 5: T2 · sagittal · 3.0mm · 0.69mm/px · 5 of 15 slices shown (1 of 2)]
[im 1/15]
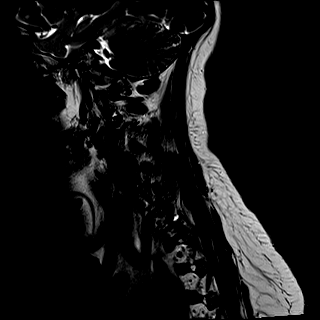
[im 4/15]
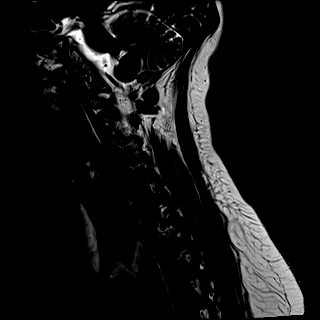
[im 8/15]
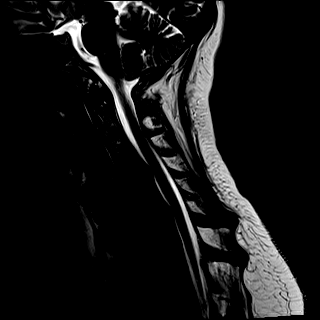
[im 11/15]
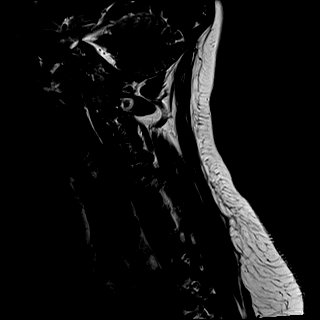
[im 15/15]
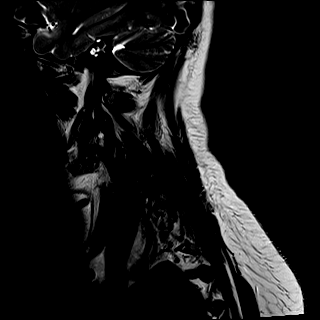

[Series 6: T1 · sagittal · 3.0mm · 0.69mm/px · 5 of 15 slices shown (1 of 2)]
[im 1/15]
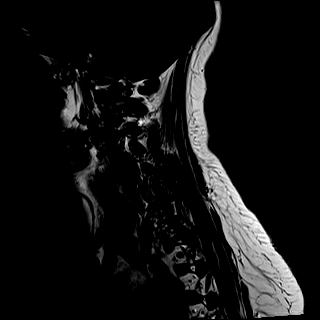
[im 4/15]
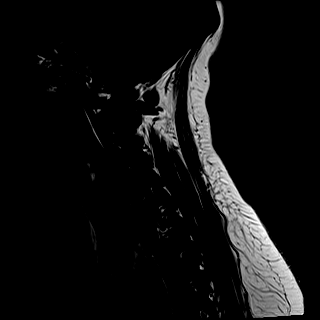
[im 8/15]
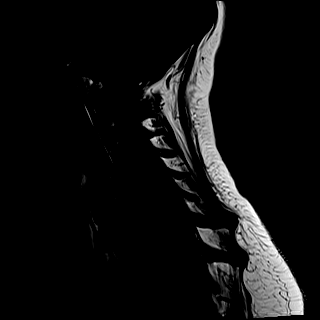
[im 11/15]
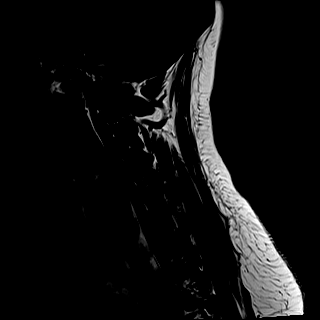
[im 15/15]
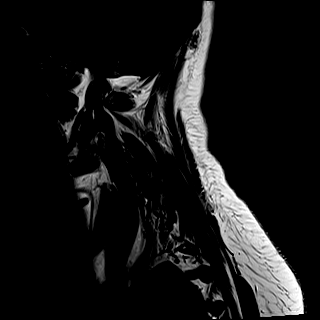

[Series 8: T2 · axial · 3.0mm · 0.66mm/px · z∈[-114,-25]mm · 11 of 30 slices shown (2 of 2)]
[im 1/30]
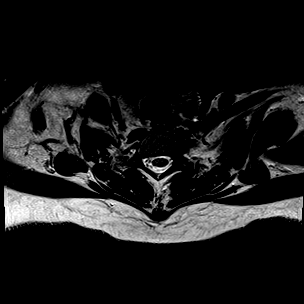
[im 3/30]
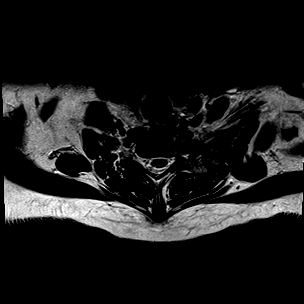
[im 6/30]
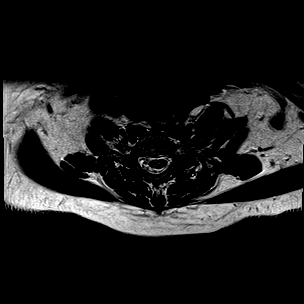
[im 9/30]
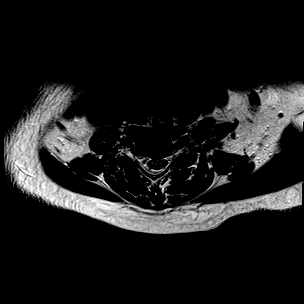
[im 12/30]
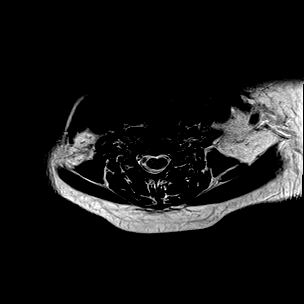
[im 15/30]
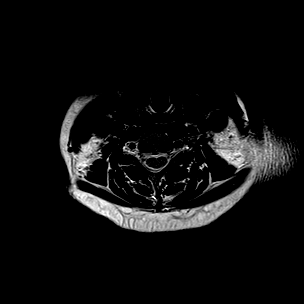
[im 18/30]
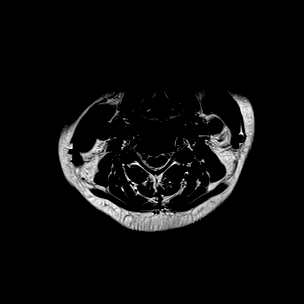
[im 21/30]
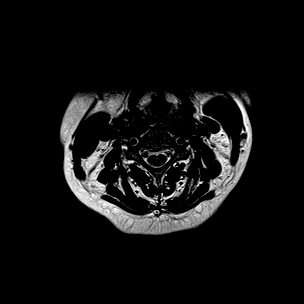
[im 24/30]
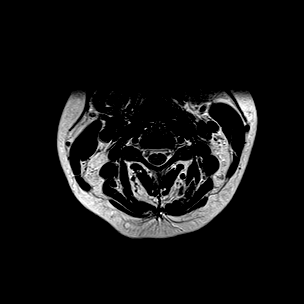
[im 27/30]
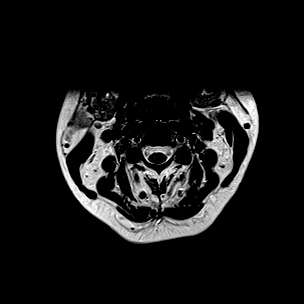
[im 30/30]
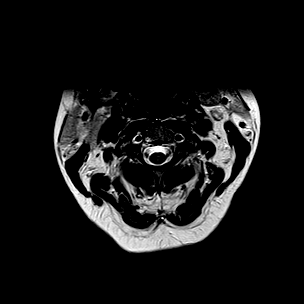

[Series 10: T1 · axial · 3.0mm · 0.39mm/px · z∈[-114,-25]mm · 11 of 30 slices shown (2 of 2)]
[im 1/30]
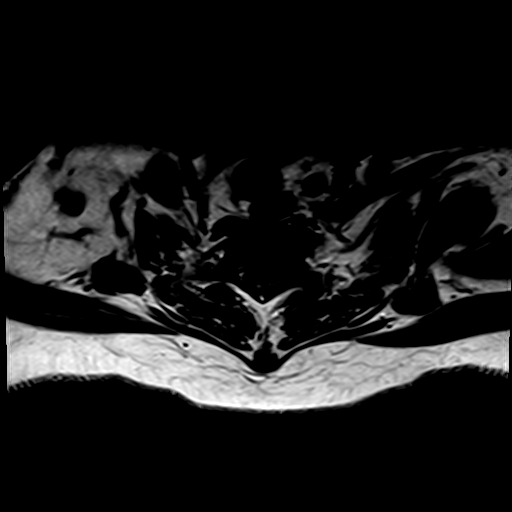
[im 3/30]
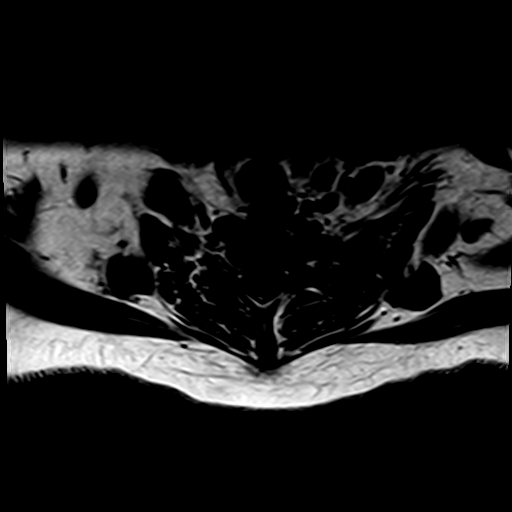
[im 6/30]
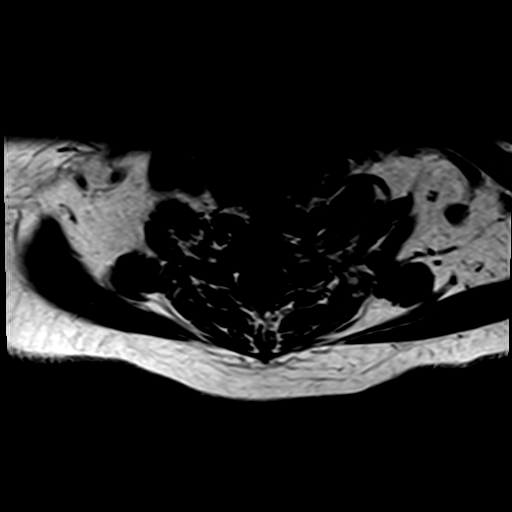
[im 9/30]
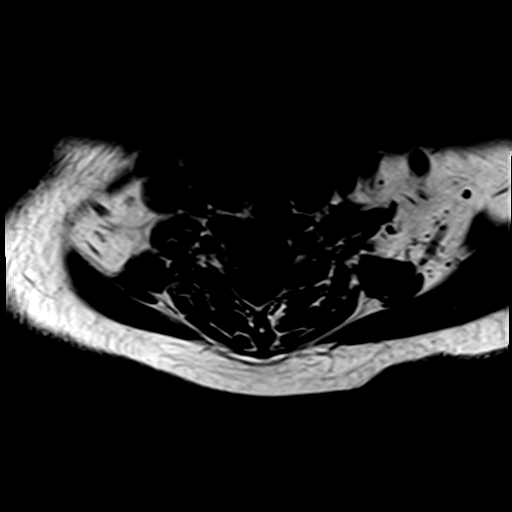
[im 12/30]
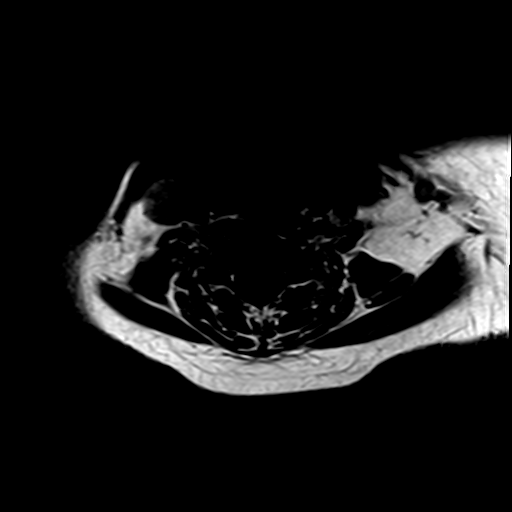
[im 15/30]
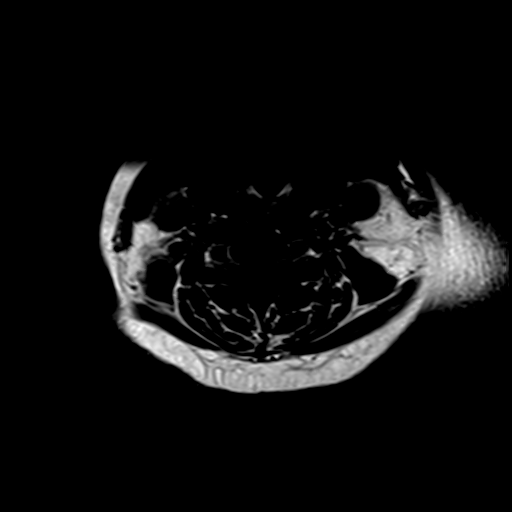
[im 18/30]
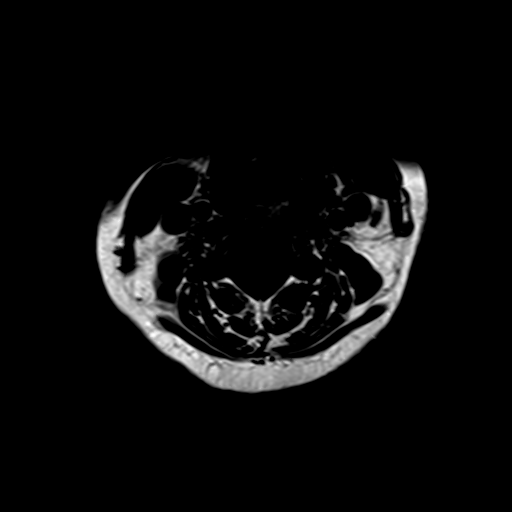
[im 21/30]
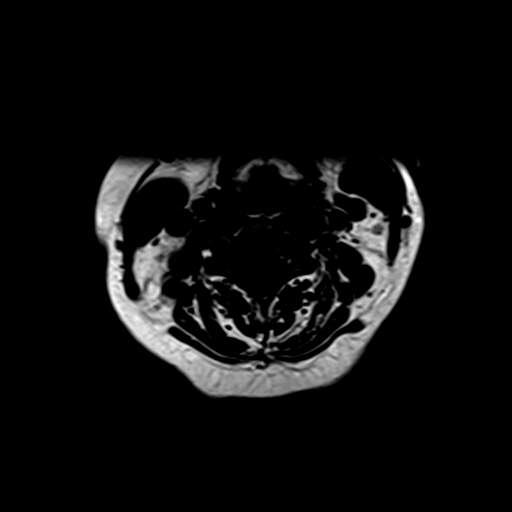
[im 24/30]
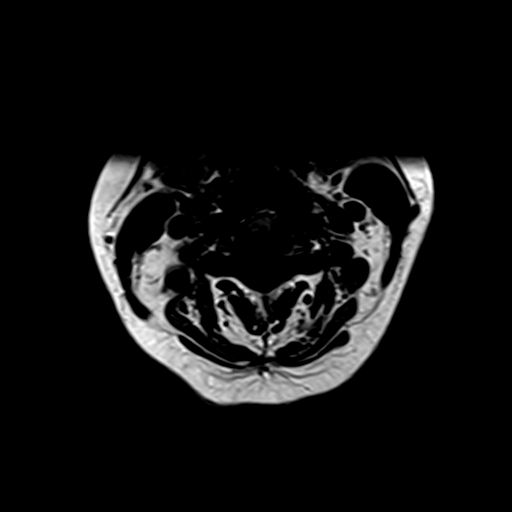
[im 27/30]
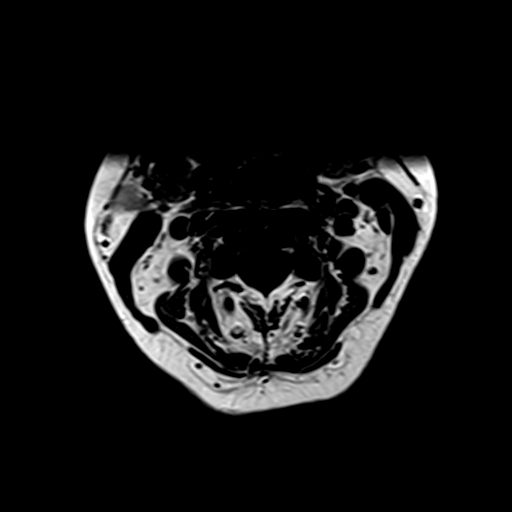
[im 30/30]
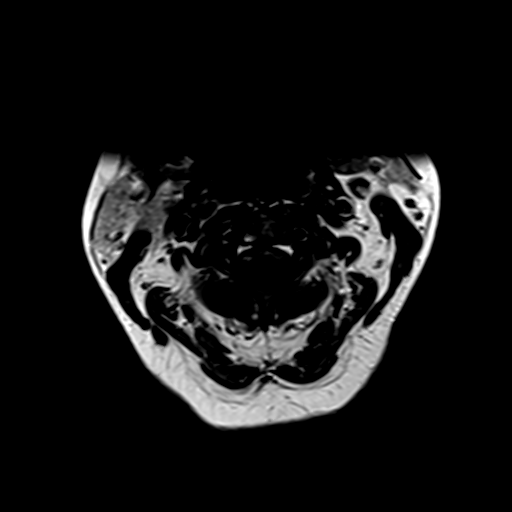

[32 of 48 positions shown; findings below may reference images not displayed]

FINDINGS: Alignment: Straightening with slight reversal of the cervical
lordosis. No static listhesis.

Vertebrae: No fracture, evidence of discitis, or bone lesion.

Cord: Normal signal and morphology. No evidence of canal or epidural
hematoma.

Posterior Fossa, vertebral arteries, paraspinal tissues: Negative.

Disc levels:

C2-C3: Unremarkable.

C3-C4: Unremarkable.

C4-C5: Right paracentral disc protrusion results in slight
deformation of the ventral cord and mild canal stenosis. No
significant foraminal stenosis.

C5-C6: Unremarkable.

C6-C7: Small central disc protrusion contacts the ventral cord
without canal stenosis. Bilateral foramina are widely patent.

C7-T1: Unremarkable.
IMPRESSION: 1. No evidence of canal or epidural hematoma or other acute
abnormality of the cervical spine.
2. Right paracentral disc protrusion at C4-C5 results in slight
deformation of the ventral cord and mild canal stenosis.
3. Small central disc protrusion at C6-C7 contacts the ventral cord
without canal stenosis.

## 2020-03-27 IMAGING — CR DG CHEST 2V
2 series · 2 of 2 positions shown · non-contrast
Comparison: [DATE]

CLINICAL DATA: Discharged yesterday after a C-section. Now with
fatigue and weakness. Hands are swelling. Chest pain.

EXAM:
CHEST - 2 VIEW

[chest pa]
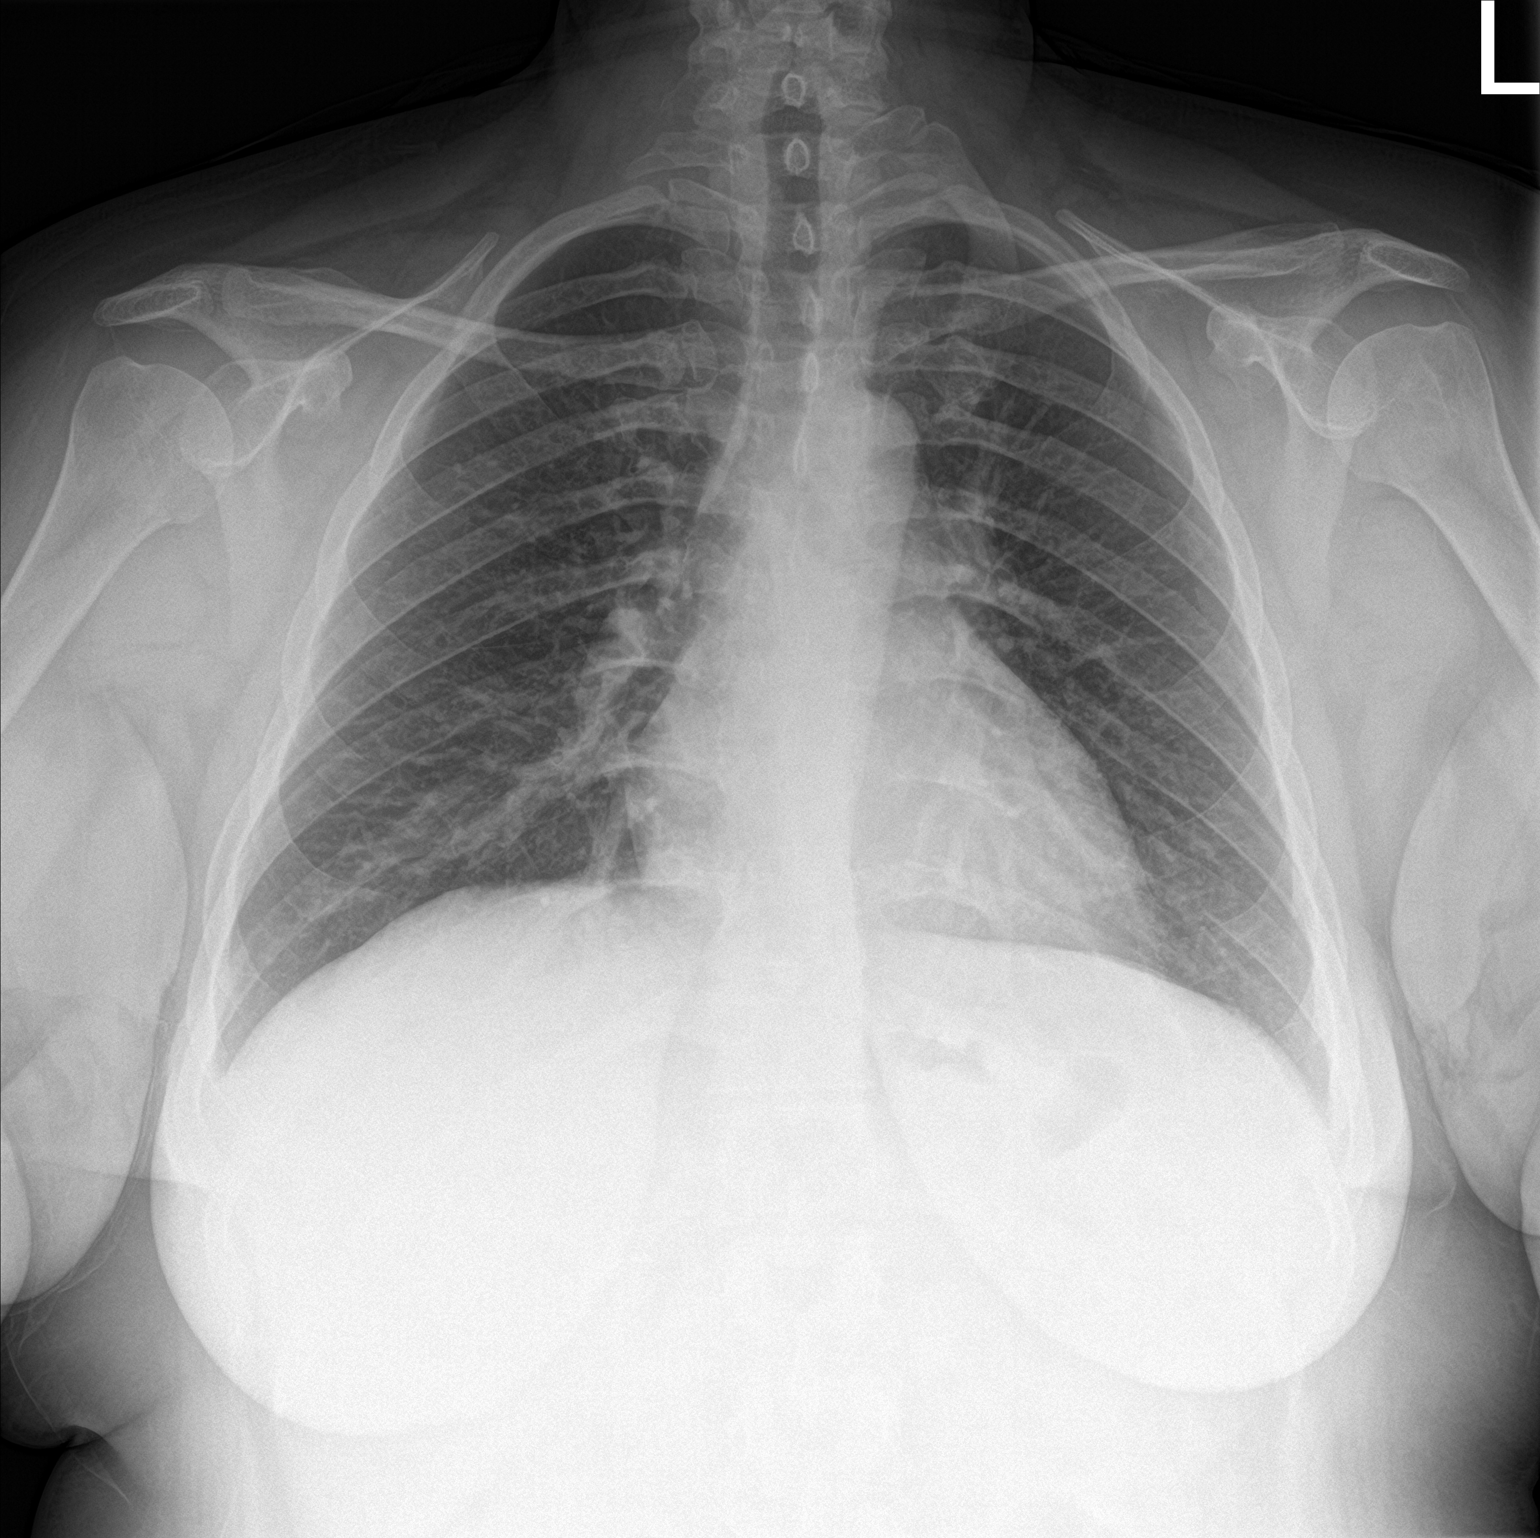

[chest lat]
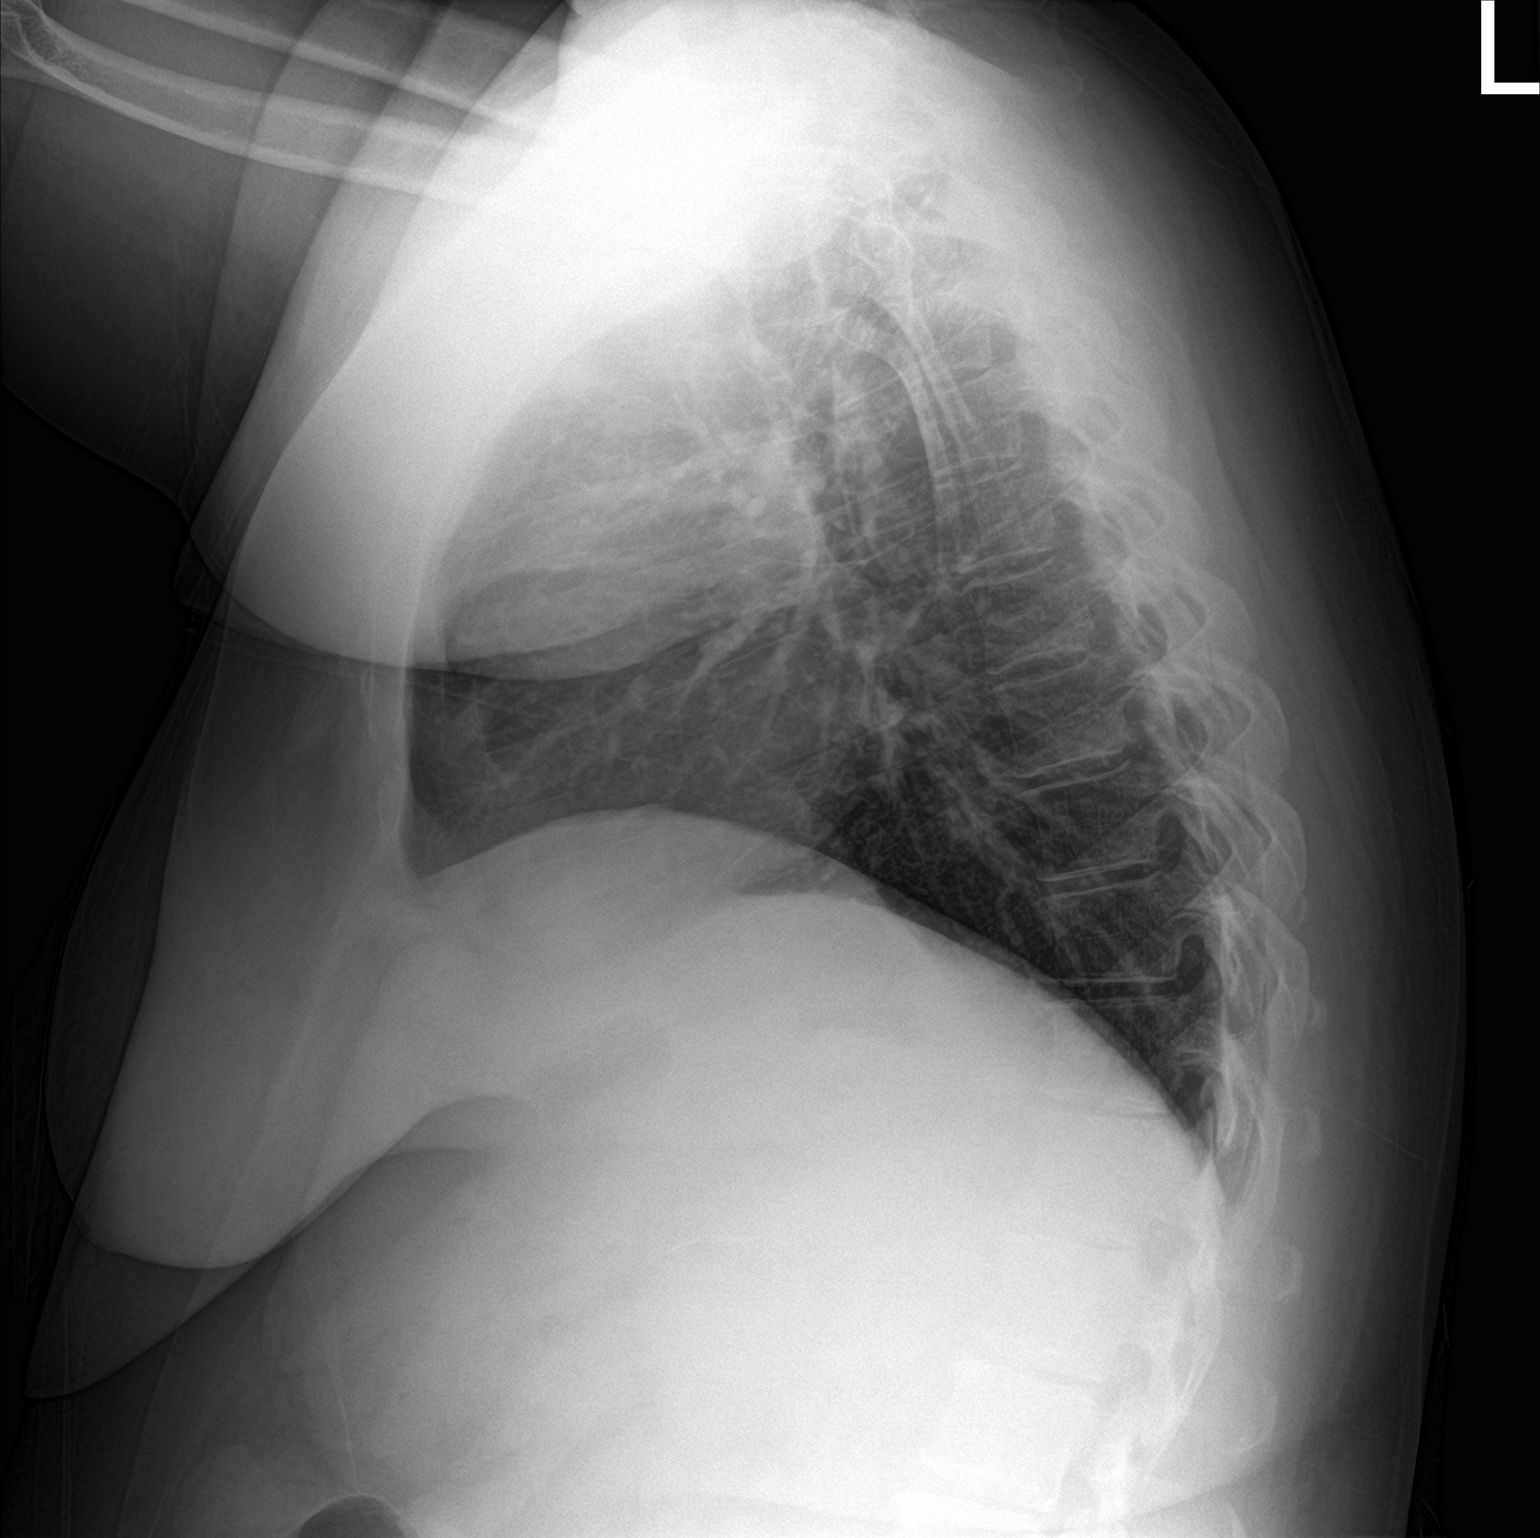

[2 of 2 positions shown; findings below may reference images not displayed]

FINDINGS: Heart size and pulmonary vascularity are normal. Lungs are clear. No
pleural effusions. No pneumothorax. Mediastinal contours appear
intact. Degenerative changes in the spine. Atrophic right first rib.
IMPRESSION: No active cardiopulmonary disease.

## 2020-03-27 IMAGING — MR MR THORACIC SPINE W/O CM
5 of 12 series · 22 of 48 positions shown · non-contrast
Comparison: Prior MRI of the cervical spine performed earlier the
same day.

CLINICAL DATA: Initial evaluation for mid back pain, numbness,
tingling, paresthesias. Postop day 3 from recent Caesarean section,
evaluate for epidural hematoma.

EXAM:
MRI THORACIC AND LUMBAR SPINE WITHOUT CONTRAST
TECHNIQUE: Multiplanar and multiecho pulse sequences of the thoracic and lumbar
spine were obtained without intravenous contrast.

[Series 19: T2 · sagittal · 3.0mm · 0.76mm/px · 5 of 17 slices shown (1 of 3)]
[im 1/17]
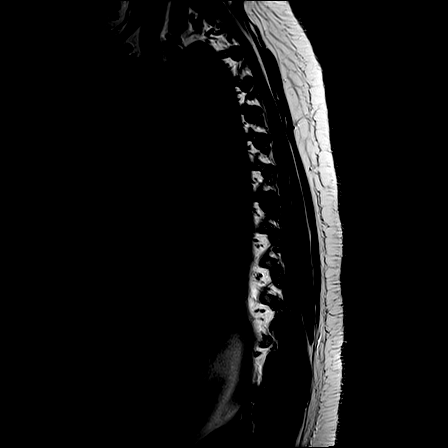
[im 5/17]
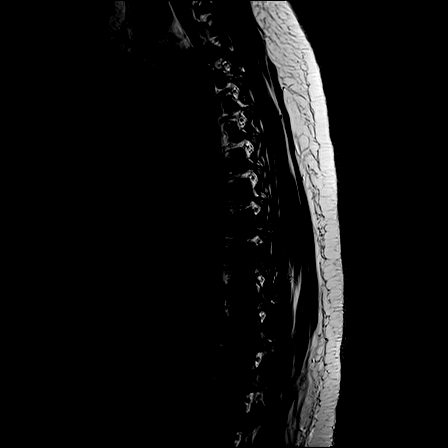
[im 9/17]
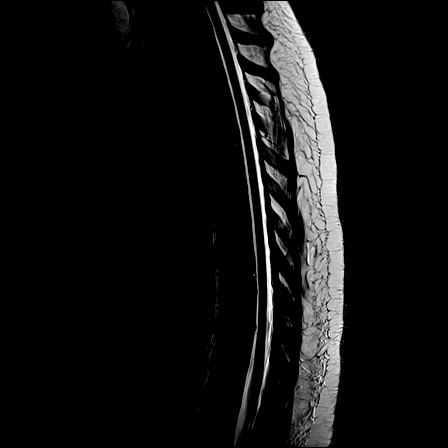
[im 13/17]
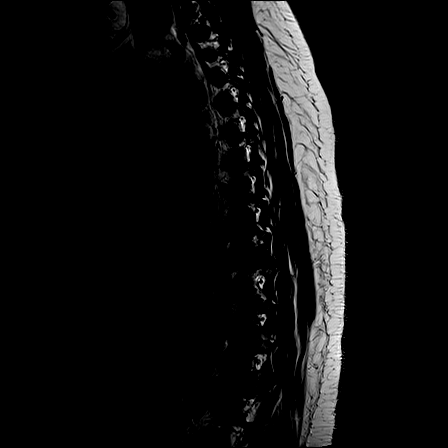
[im 17/17]
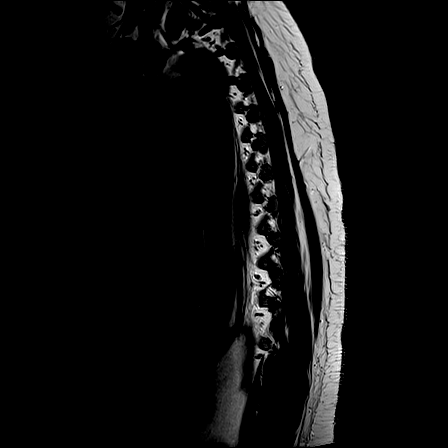

[Series 20: T1 · sagittal · 3.0mm · 0.62mm/px · 3 of 9 slices shown (1 of 2)]
[im 1/9]
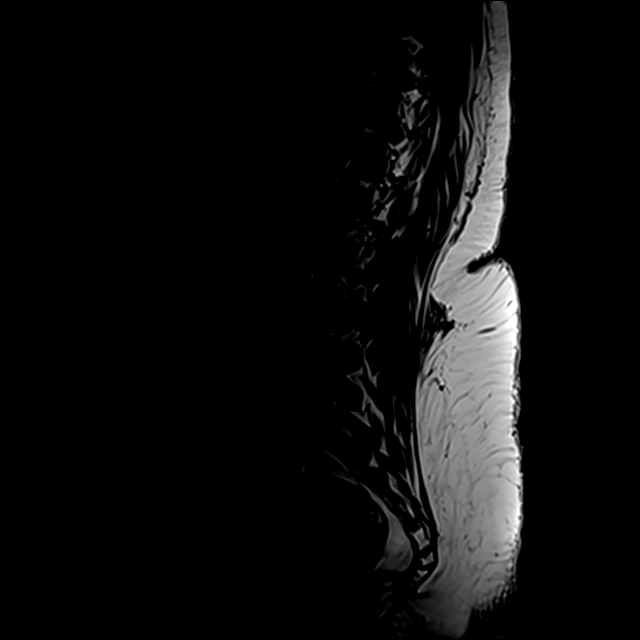
[im 5/9]
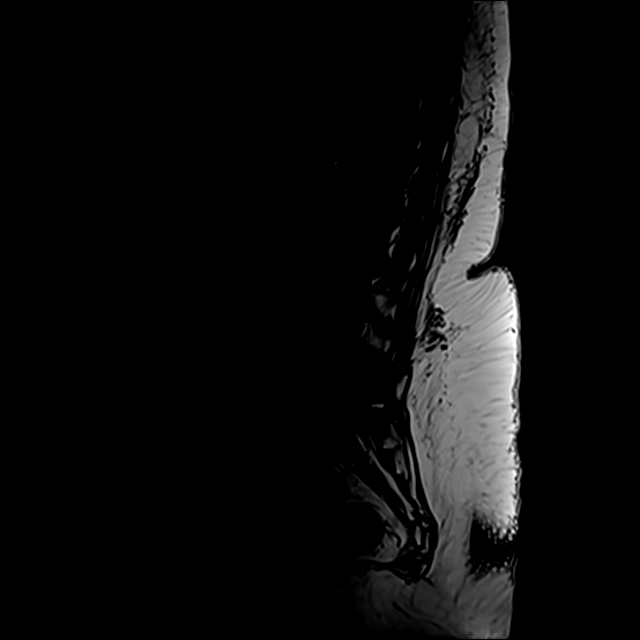
[im 9/9]
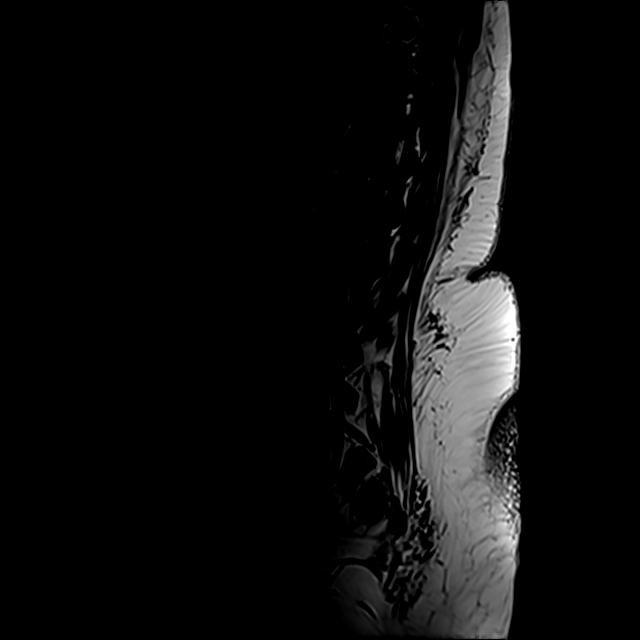

[Series 21: T1 · sagittal · 3.0mm · 0.62mm/px · 1 of 9 slices shown (2 of 2)]
[im 1/9]
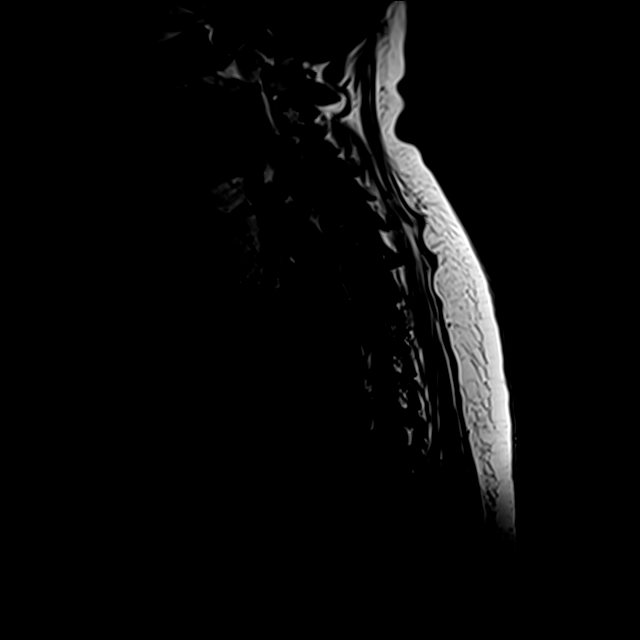

[Series 26: T2 · sagittal · 3.0mm · 0.76mm/px · 4 of 17 slices shown (2 of 3)]
[im 1/17]
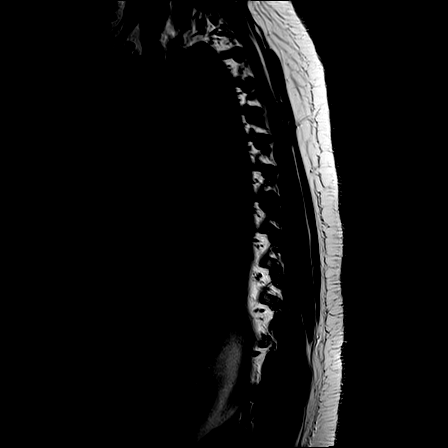
[im 6/17]
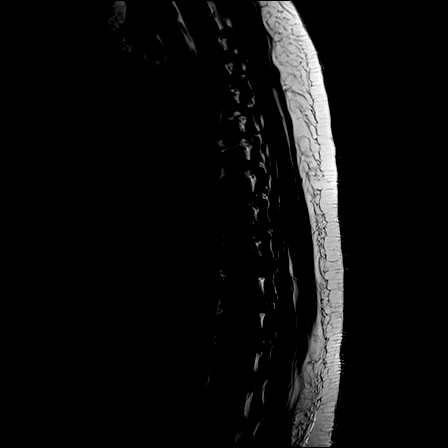
[im 11/17]
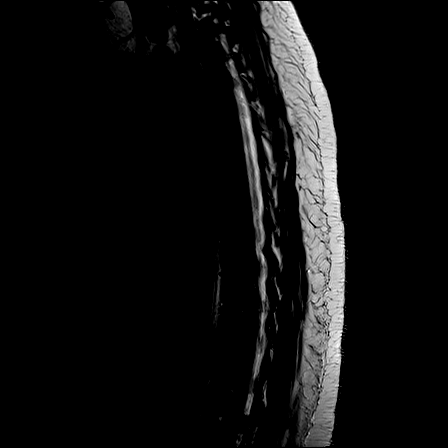
[im 17/17]
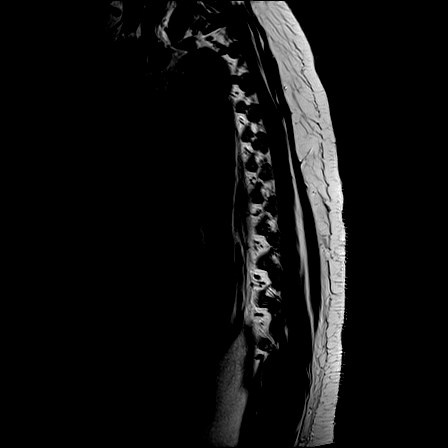

[Series 29: T2 · axial · 5.0mm · 0.59mm/px · z∈[-220,+27]mm · 9 of 36 slices shown (3 of 3)]
[im 1/36]
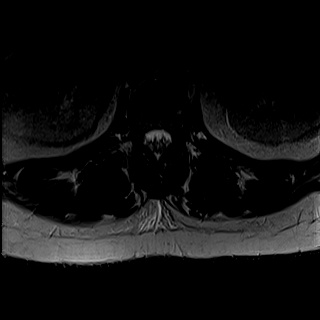
[im 5/36]
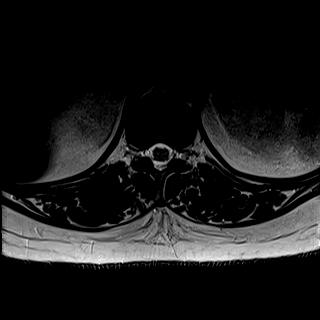
[im 9/36]
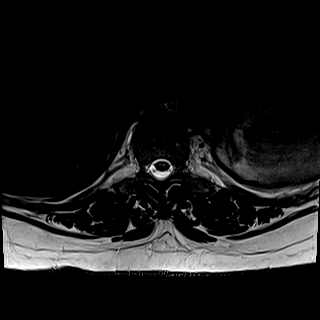
[im 14/36]
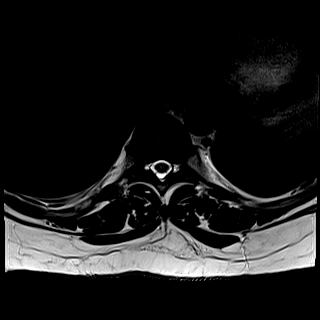
[im 18/36]
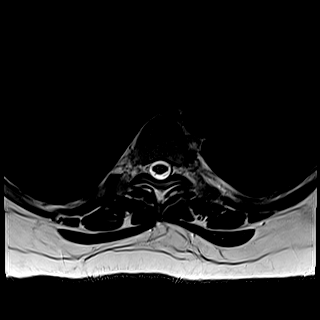
[im 22/36]
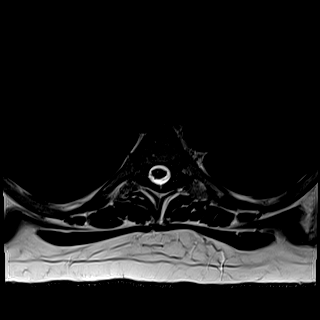
[im 27/36]
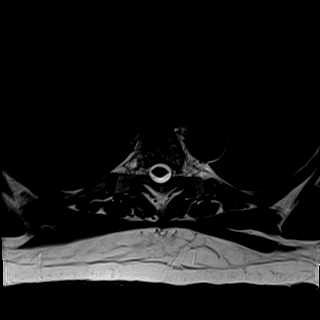
[im 31/36]
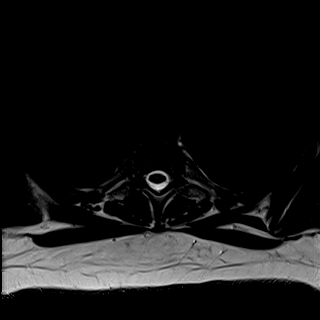
[im 36/36]
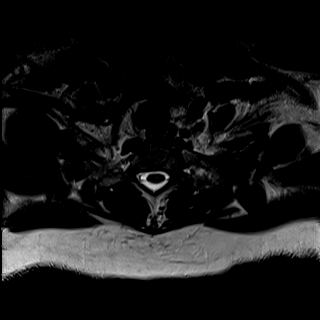

[22 of 48 positions shown; findings below may reference images not displayed]

FINDINGS: MRI THORACIC SPINE FINDINGS

Alignment: Vertebral bodies normally aligned with preservation of
the normal thoracic kyphosis. No listhesis.

Vertebrae: Vertebral body height maintained without acute or chronic
fracture. Bone marrow signal intensity somewhat diffusely decreased
on T1 weighted imaging, suspected to be related to anemia and/or
recent pregnancy. Few scattered subcentimeter benign hemangiomata
noted. No worrisome osseous lesions. No abnormal marrow edema.

Cord: Signal intensity within the thoracic spinal cord is normal.
Normal cord caliber and morphology. No epidural hematoma or other
collection.

Paraspinal and other soft tissues: Paraspinous soft tissues
demonstrate no acute finding. Trace layering bilateral pleural
effusions noted. Visualized visceral structures otherwise
unremarkable.

Disc levels:

No significant disc pathology seen within the thoracic spine. No
disc bulge or focal disc herniation. Mild lower thoracic facet
degeneration, most notable at T7-8 and T8-9. No significant stenosis
or evidence for neural impingement.

MRI LUMBAR SPINE FINDINGS

Segmentation: Standard. Lowest well-formed disc space labeled the
L5-S1 level.

Alignment: Physiologic with preservation of the normal lumbar
lordosis. No listhesis.

Vertebrae: Vertebral body height maintained without acute or chronic
fracture. Bone marrow signal intensity somewhat diffusely decreased
on T1 weighted sequence, nonspecific, but suspected to be related to
anemia and/or recent pregnancy. Few scattered benign hemangiomata
noted. No worrisome osseous lesions. No abnormal marrow edema.

Conus medullaris and cauda equina: Conus extends to the T12-L1
level. Conus and cauda equina appear normal. No epidural hematoma or
other collection.

Paraspinal and other soft tissues: Paraspinous soft tissues
demonstrate no acute finding. Recently gravid uterus partially
visualized. Visualized visceral structures otherwise unremarkable.

Disc levels:

L1-2:  Unremarkable.

L2-3: Mild disc bulge with disc desiccation. No significant spinal
stenosis. Foramina remain patent.

L3-4: Minimal annular disc bulge. No significant spinal stenosis.
Foramina remain patent.

L4-5: Mild diffuse disc bulge with disc desiccation. Superimposed
small central disc protrusion indents the ventral thecal sac (series
4, image 16). Associated annular fissure. Mild bilateral facet
hypertrophy. Resultant mild narrowing of the left lateral recess.
Central canal remains patent. Mild bilateral L4 foraminal narrowing,
slightly worse on the left.

L5-S1: Disc desiccation. Superimposed shallow central disc
protrusion mildly indents the ventral thecal sac. Associated annular
fissure. Mild bilateral facet hypertrophy. Resultant mild narrowing
of the lateral recesses bilaterally without frank neural
impingement. Central canal remains patent. No significant foraminal
stenosis.
IMPRESSION: MR THORACIC SPINE IMPRESSION:

1. No acute abnormality within the thoracic spine. No epidural
hematoma or other collection. No other findings to explain patient's
symptoms identified.
2. Mild lower thoracic facet degeneration. No significant disc
pathology, stenosis, or evidence for neural impingement.
3. Trace layering bilateral pleural effusions.

MRI LUMBAR SPINE IMPRESSION:

1. No acute abnormality within the lumbar spine. No epidural
hematoma or other collection. No other findings to explain patient's
symptoms identified.
2. Small central disc protrusions at L4-5 and L5-S1 with resultant
mild lateral recess narrowing as above. No frank neural impingement.

## 2020-03-27 IMAGING — MR MR LUMBAR SPINE W/O CM
4 of 5 series · 26 of 48 positions shown · non-contrast
Comparison: Prior MRI of the cervical spine performed earlier the
same day.

CLINICAL DATA: Initial evaluation for mid back pain, numbness,
tingling, paresthesias. Postop day 3 from recent Caesarean section,
evaluate for epidural hematoma.

EXAM:
MRI THORACIC AND LUMBAR SPINE WITHOUT CONTRAST
TECHNIQUE: Multiplanar and multiecho pulse sequences of the thoracic and lumbar
spine were obtained without intravenous contrast.

[Series 1: T2 · sagittal · 4.0mm · 0.73mm/px · 9 of 16 slices shown (1 of 2)]
[im 1/16]
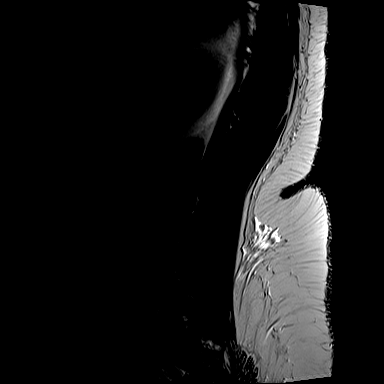
[im 2/16]
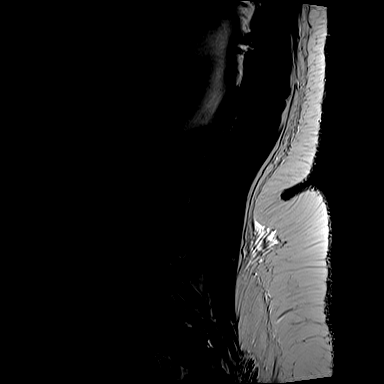
[im 4/16]
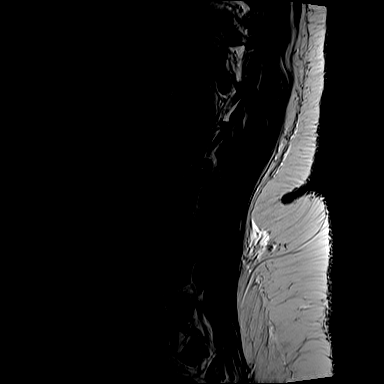
[im 6/16]
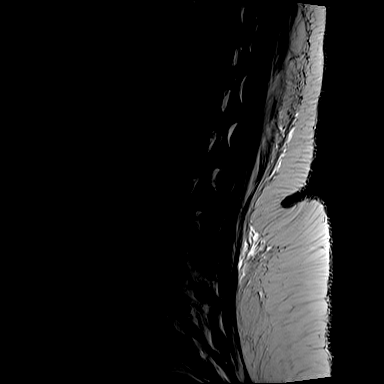
[im 8/16]
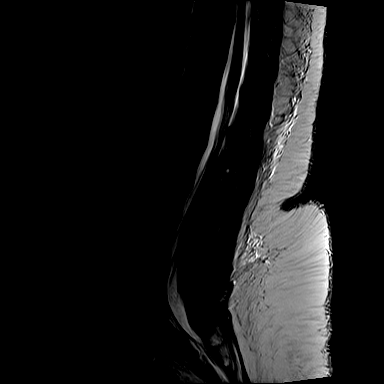
[im 10/16]
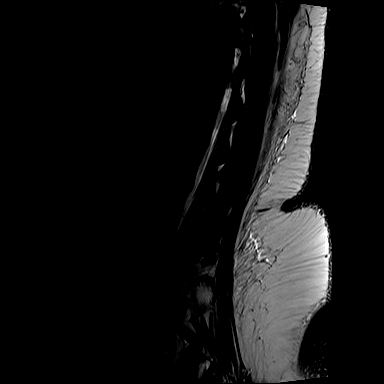
[im 12/16]
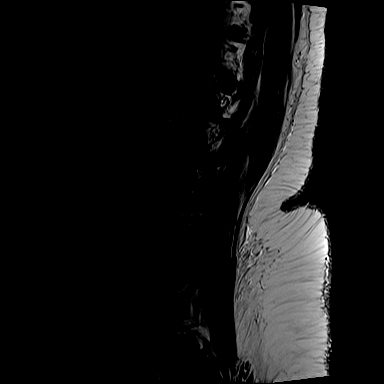
[im 14/16]
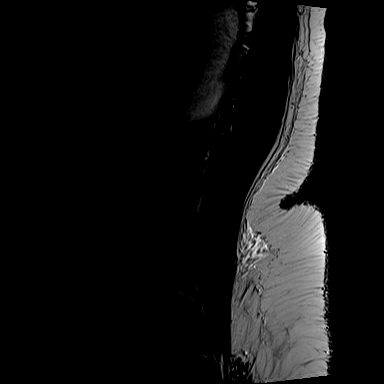
[im 16/16]
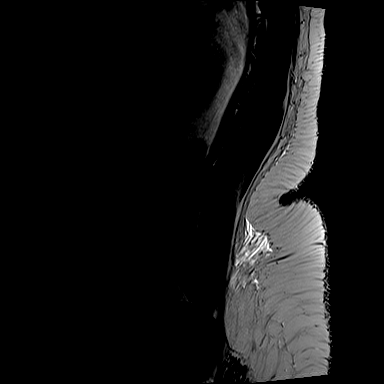

[Series 3: T1 · sagittal · 4.0mm · 0.88mm/px · 3 of 16 slices shown (1 of 2)]
[im 3/16]
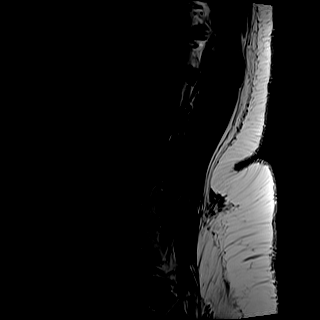
[im 9/16]
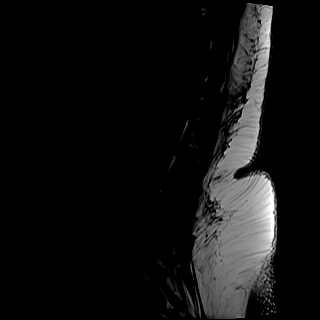
[im 13/16]
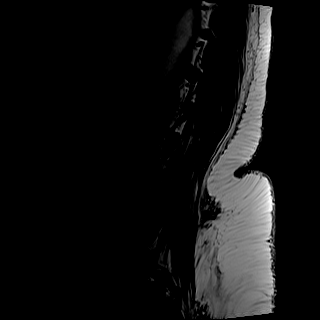

[Series 4: T2 · axial · 5.0mm · 0.57mm/px · z∈[-386,-229]mm · 11 of 22 slices shown (2 of 2)]
[im 1/22]
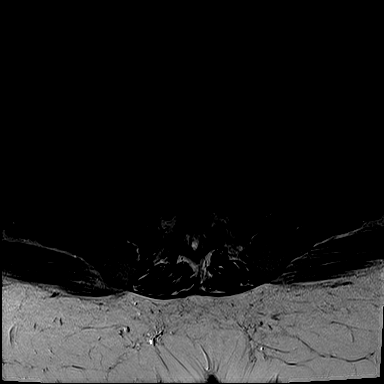
[im 3/22]
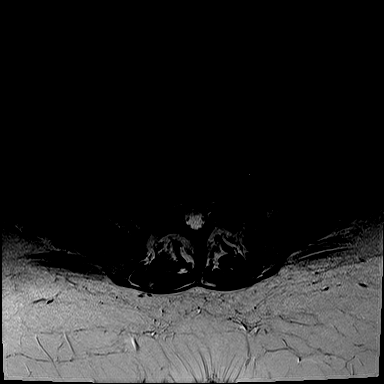
[im 5/22]
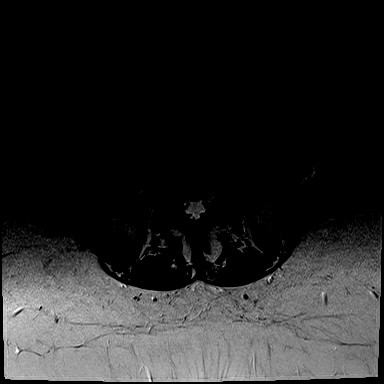
[im 7/22]
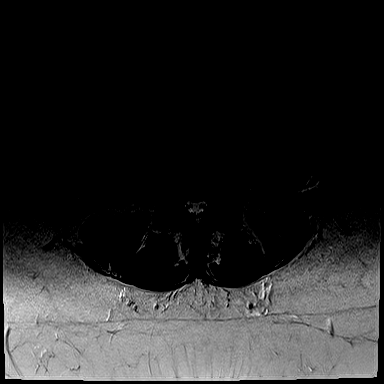
[im 9/22]
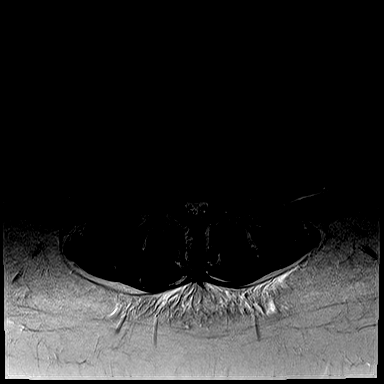
[im 11/22]
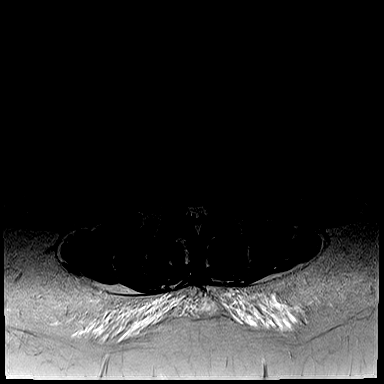
[im 13/22]
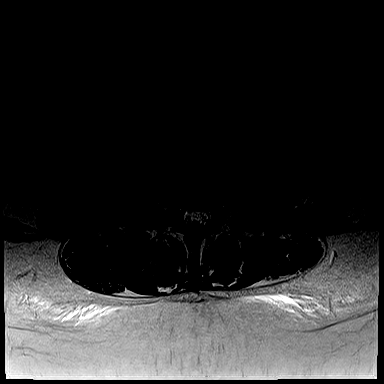
[im 15/22]
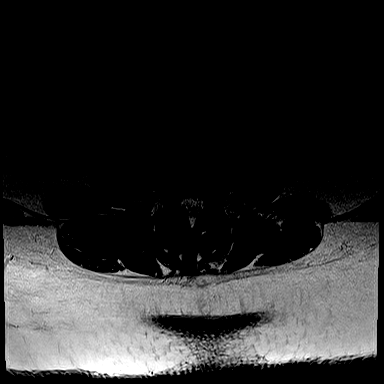
[im 17/22]
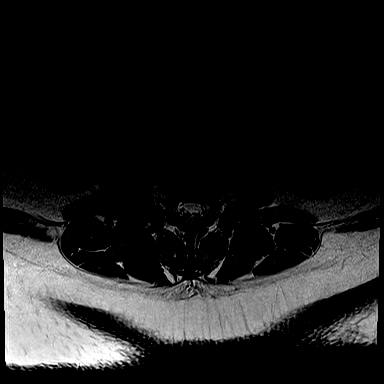
[im 19/22]
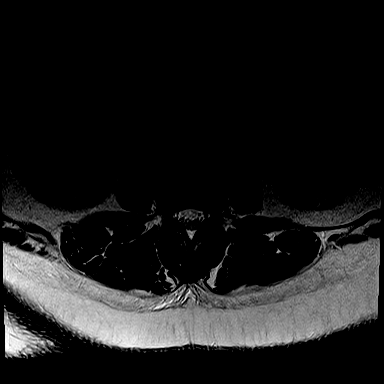
[im 22/22]
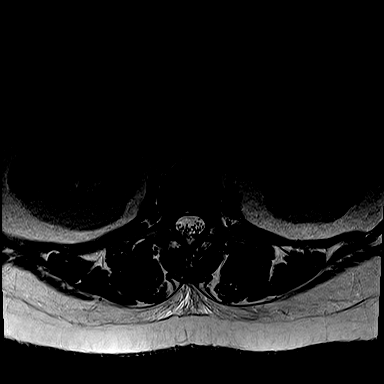

[Series 5: T1 · axial · 5.0mm · 0.34mm/px · z∈[-371,-251]mm · 3 of 22 slices shown (2 of 2)]
[im 3/22]
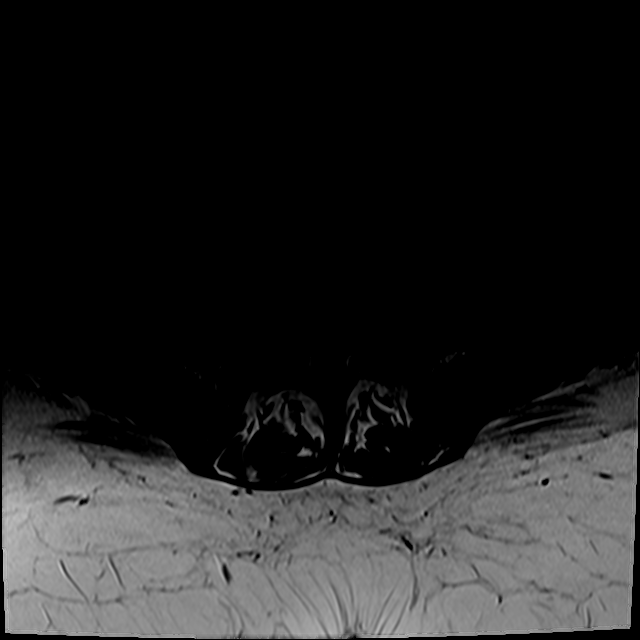
[im 11/22]
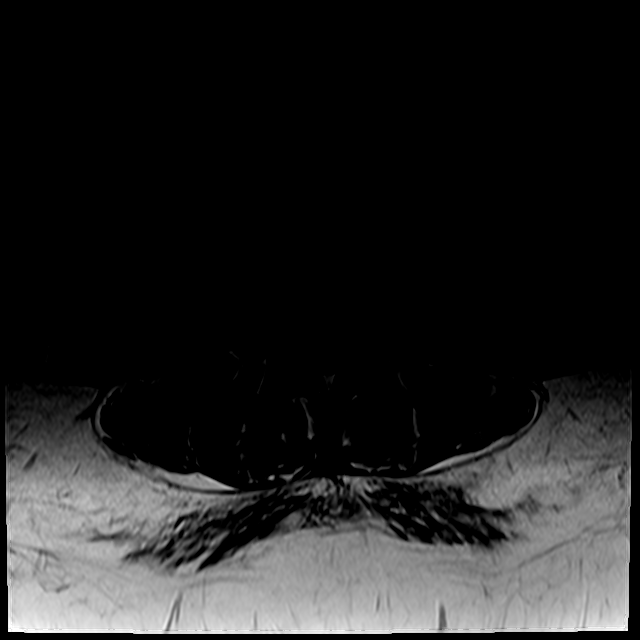
[im 19/22]
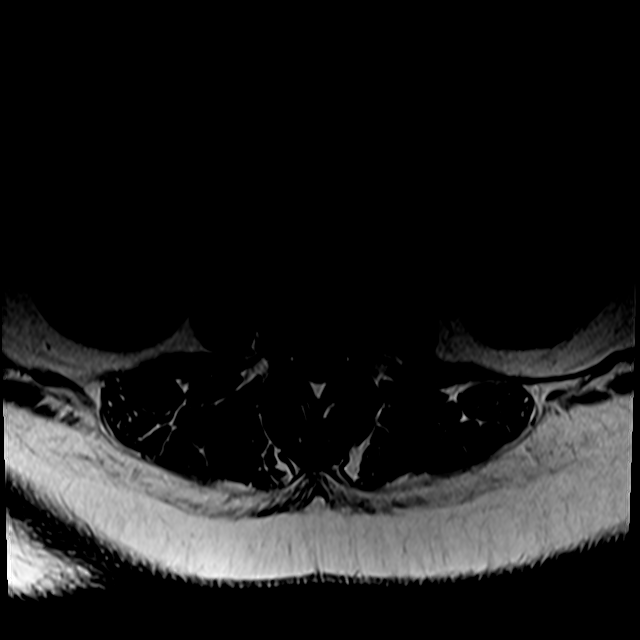

[26 of 48 positions shown; findings below may reference images not displayed]

FINDINGS: MRI THORACIC SPINE FINDINGS

Alignment: Vertebral bodies normally aligned with preservation of
the normal thoracic kyphosis. No listhesis.

Vertebrae: Vertebral body height maintained without acute or chronic
fracture. Bone marrow signal intensity somewhat diffusely decreased
on T1 weighted imaging, suspected to be related to anemia and/or
recent pregnancy. Few scattered subcentimeter benign hemangiomata
noted. No worrisome osseous lesions. No abnormal marrow edema.

Cord: Signal intensity within the thoracic spinal cord is normal.
Normal cord caliber and morphology. No epidural hematoma or other
collection.

Paraspinal and other soft tissues: Paraspinous soft tissues
demonstrate no acute finding. Trace layering bilateral pleural
effusions noted. Visualized visceral structures otherwise
unremarkable.

Disc levels:

No significant disc pathology seen within the thoracic spine. No
disc bulge or focal disc herniation. Mild lower thoracic facet
degeneration, most notable at T7-8 and T8-9. No significant stenosis
or evidence for neural impingement.

MRI LUMBAR SPINE FINDINGS

Segmentation: Standard. Lowest well-formed disc space labeled the
L5-S1 level.

Alignment: Physiologic with preservation of the normal lumbar
lordosis. No listhesis.

Vertebrae: Vertebral body height maintained without acute or chronic
fracture. Bone marrow signal intensity somewhat diffusely decreased
on T1 weighted sequence, nonspecific, but suspected to be related to
anemia and/or recent pregnancy. Few scattered benign hemangiomata
noted. No worrisome osseous lesions. No abnormal marrow edema.

Conus medullaris and cauda equina: Conus extends to the T12-L1
level. Conus and cauda equina appear normal. No epidural hematoma or
other collection.

Paraspinal and other soft tissues: Paraspinous soft tissues
demonstrate no acute finding. Recently gravid uterus partially
visualized. Visualized visceral structures otherwise unremarkable.

Disc levels:

L1-2:  Unremarkable.

L2-3: Mild disc bulge with disc desiccation. No significant spinal
stenosis. Foramina remain patent.

L3-4: Minimal annular disc bulge. No significant spinal stenosis.
Foramina remain patent.

L4-5: Mild diffuse disc bulge with disc desiccation. Superimposed
small central disc protrusion indents the ventral thecal sac (series
4, image 16). Associated annular fissure. Mild bilateral facet
hypertrophy. Resultant mild narrowing of the left lateral recess.
Central canal remains patent. Mild bilateral L4 foraminal narrowing,
slightly worse on the left.

L5-S1: Disc desiccation. Superimposed shallow central disc
protrusion mildly indents the ventral thecal sac. Associated annular
fissure. Mild bilateral facet hypertrophy. Resultant mild narrowing
of the lateral recesses bilaterally without frank neural
impingement. Central canal remains patent. No significant foraminal
stenosis.
IMPRESSION: MR THORACIC SPINE IMPRESSION:

1. No acute abnormality within the thoracic spine. No epidural
hematoma or other collection. No other findings to explain patient's
symptoms identified.
2. Mild lower thoracic facet degeneration. No significant disc
pathology, stenosis, or evidence for neural impingement.
3. Trace layering bilateral pleural effusions.

MRI LUMBAR SPINE IMPRESSION:

1. No acute abnormality within the lumbar spine. No epidural
hematoma or other collection. No other findings to explain patient's
symptoms identified.
2. Small central disc protrusions at L4-5 and L5-S1 with resultant
mild lateral recess narrowing as above. No frank neural impingement.

## 2020-03-27 MED ORDER — M.V.I. ADULT IV INJ
Freq: Once | INTRAVENOUS | Status: AC
  Filled 2020-03-27: qty 10

## 2020-03-27 MED ORDER — LABETALOL HCL 5 MG/ML IV SOLN
20.0000 mg | INTRAVENOUS | Status: DC | PRN
  Filled 2020-03-27: qty 4

## 2020-03-27 MED ORDER — IBUPROFEN 600 MG PO TABS
600.0000 mg | ORAL_TABLET | Freq: Once | ORAL | Status: AC
  Administered 2020-03-27: 600 mg via ORAL
  Filled 2020-03-27: qty 1

## 2020-03-27 MED ORDER — ACETAMINOPHEN 325 MG PO TABS
650.0000 mg | ORAL_TABLET | Freq: Once | ORAL | Status: AC
  Administered 2020-03-27: 650 mg via ORAL
  Filled 2020-03-27: qty 2

## 2020-03-27 MED ORDER — LABETALOL HCL 5 MG/ML IV SOLN: 80.0000 mg | INTRAVENOUS | Status: DC | PRN

## 2020-03-27 MED ORDER — NIFEDIPINE ER OSMOTIC RELEASE 30 MG PO TB24: 30.0000 mg | ORAL_TABLET | Freq: Every day | ORAL | 1 refills | Status: DC

## 2020-03-27 MED ORDER — LABETALOL HCL 5 MG/ML IV SOLN: 40.0000 mg | INTRAVENOUS | Status: DC | PRN

## 2020-03-27 MED ORDER — HYDRALAZINE HCL 20 MG/ML IJ SOLN: 10.0000 mg | INTRAMUSCULAR | Status: DC | PRN

## 2020-03-27 MED ORDER — LACTATED RINGERS IV SOLN: INTRAVENOUS | Status: DC
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
## 2020-03-27 NOTE — Anesthesia Postprocedure Evaluation (Signed)
Anesthesia Post Note  Went to see patient in MAU. She is complaining of worsening leg weakness and shaking. She also has a complaint of back pain near the area of her spinal procedure. On inspection of her back she has some mild bruising at the puncture site which appears typical s/p spinal procedure. She is able to ambulate. Sensation is fully intact throughout the bilateral lower extremities. Plantar and dorsiflexion are intact bilaterally. She has some subjective quadriceps weakness that is difficult to assess due to body habitus.  Given her complaint of worsening back pain, I do think it is reasonable to get an MRI of the spine to rule out any pathology that could be causing her symptoms. If negative, an outpatient neuro consult might also be considered.  I discussed this plan with her team in the MAU and they will implement and follow-up accordingly. We will await results of the MRI. Thank you, Norton Blizzard, MD

## 2020-03-27 NOTE — MAU Note (Signed)
Presents with c/o lower back pain, fatigue, and bilateral leg weakness.  Seen in MAU last night for same complaint.  Called MD  Office, office called Dr. Elgie Congo (anesthesia), Dr. Elgie Congo wants pt to be evaluated for possible hematoma secondary spinal anesthesia.  S/P C/S September 4th.
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
## 2020-03-27 NOTE — Addendum Note (Signed)
Addendum  created 03/27/20 1122 by Merlinda Frederick, MD   Clinical Note Signed

## 2020-03-27 NOTE — MAU Note (Signed)
Pain in low back, worse than previously. When leans back- feels like there is a fist in her back.

## 2020-03-27 NOTE — Discharge Instructions (Signed)
Cesarean Delivery, Care After This sheet gives you information about how to care for yourself after your procedure. Your health care provider may also give you more specific instructions. If you have problems or questions, contact your health care provider. What can I expect after the procedure? After the procedure, it is common to have:  A small amount of blood or clear fluid coming from the incision.  Some redness, swelling, and pain in your incision area.  Some abdominal pain and soreness.  Vaginal bleeding (lochia). Even though you did not have a vaginal delivery, you will still have vaginal bleeding and discharge.  Pelvic cramps.  Fatigue. You may have pain, swelling, and discomfort in the tissue between your vagina and your anus (perineum) if:  Your C-section was unplanned, and you were allowed to labor and push.  An incision was made in the area (episiotomy) or the tissue tore during attempted vaginal delivery. Follow these instructions at home: Incision care   Follow instructions from your health care provider about how to take care of your incision. Make sure you: ? Wash your hands with soap and water before you change your bandage (dressing). If soap and water are not available, use hand sanitizer. ? If you have a dressing, change it or remove it as told by your health care provider. ? Leave stitches (sutures), skin staples, skin glue, or adhesive strips in place. These skin closures may need to stay in place for 2 weeks or longer. If adhesive strip edges start to loosen and curl up, you may trim the loose edges. Do not remove adhesive strips completely unless your health care provider tells you to do that.  Check your incision area every day for signs of infection. Check for: ? More redness, swelling, or pain. ? More fluid or blood. ? Warmth. ? Pus or a bad smell.  Do not take baths, swim, or use a hot tub until your health care provider says it's okay. Ask your health  care provider if you can take showers.  When you cough or sneeze, hug a pillow. This helps with pain and decreases the chance of your incision opening up (dehiscing). Do this until your incision heals. Medicines  Take over-the-counter and prescription medicines only as told by your health care provider.  If you were prescribed an antibiotic medicine, take it as told by your health care provider. Do not stop taking the antibiotic even if you start to feel better.  Do not drive or use heavy machinery while taking prescription pain medicine. Lifestyle  Do not drink alcohol. This is especially important if you are breastfeeding or taking pain medicine.  Do not use any products that contain nicotine or tobacco, such as cigarettes, e-cigarettes, and chewing tobacco. If you need help quitting, ask your health care provider. Eating and drinking  Drink at least 8 eight-ounce glasses of water every day unless told not to by your health care provider. If you breastfeed, you may need to drink even more water.  Eat high-fiber foods every day. These foods may help prevent or relieve constipation. High-fiber foods include: ? Whole grain cereals and breads. ? Brown rice. ? Beans. ? Fresh fruits and vegetables. Activity   If possible, have someone help you care for your baby and help with household activities for at least a few days after you leave the hospital.  Return to your normal activities as told by your health care provider. Ask your health care provider what activities are safe for   you.  Rest as much as possible. Try to rest or take a nap while your baby is sleeping.  Do not lift anything that is heavier than 10 lbs (4.5 kg), or the limit that you were told, until your health care provider says that it is safe.  Talk with your health care provider about when you can engage in sexual activity. This may depend on your: ? Risk of infection. ? How fast you heal. ? Comfort and desire to  engage in sexual activity. General instructions  Do not use tampons or douches until your health care provider approves.  Wear loose, comfortable clothing and a supportive and well-fitting bra.  Keep your perineum clean and dry. Wipe from front to back when you use the toilet.  If you pass a blood clot, save it and call your health care provider to discuss. Do not flush blood clots down the toilet before you get instructions from your health care provider.  Keep all follow-up visits for you and your baby as told by your health care provider. This is important. Contact a health care provider if:  You have: ? A fever. ? Bad-smelling vaginal discharge. ? Pus or a bad smell coming from your incision. ? Difficulty or pain when urinating. ? A sudden increase or decrease in the frequency of your bowel movements. ? More redness, swelling, or pain around your incision. ? More fluid or blood coming from your incision. ? A rash. ? Nausea. ? Little or no interest in activities you used to enjoy. ? Questions about caring for yourself or your baby.  Your incision feels warm to the touch.  Your breasts turn red or become painful or hard.  You feel unusually sad or worried.  You vomit.  You pass a blood clot from your vagina.  You urinate more than usual.  You are dizzy or light-headed. Get help right away if:  You have: ? Pain that does not go away or get better with medicine. ? Chest pain. ? Difficulty breathing. ? Blurred vision or spots in your vision. ? Thoughts about hurting yourself or your baby. ? New pain in your abdomen or in one of your legs. ? A severe headache.  You faint.  You bleed from your vagina so much that you fill more than one sanitary pad in one hour. Bleeding should not be heavier than your heaviest period. Summary  After the procedure, it is common to have pain at your incision site, abdominal cramping, and slight bleeding from your vagina.  Check  your incision area every day for signs of infection.  Tell your health care provider about any unusual symptoms.  Keep all follow-up visits for you and your baby as told by your health care provider. This information is not intended to replace advice given to you by your health care provider. Make sure you discuss any questions you have with your health care provider. Document Revised: 01/13/2018 Document Reviewed: 01/13/2018 Elsevier Patient Education  2020 [employer]  

## 2020-03-27 NOTE — Addendum Note (Signed)
Addendum  created 03/27/20 1440 by Merlinda Frederick, MD   Clinical Note Signed

## 2020-03-27 NOTE — Anesthesia Post-op Follow-up Note (Signed)
I was called by [facility] stating patient came to MAU last night/early this morning 03/27/20 with complaints of fatigue, weakness, chest heaviness, and swelling of her hands. She was discharged home this AM. She has now called their office complaining of leg weakness, pain/pressure at the spinal site.  She stated she feels like her legs are going to give out. I recommended that she return to the hospital to be evaluated and to likely have an MRI to rule out any abnormalities that could be causing this spine pain/pressure and now leg weakness.  I discussed this with Olivia Mackie at [facility] who will contact the patient as well as Dr. Si Raider in order to have her come back into the hospital for evaluation. Norton Blizzard, MD

## 2020-03-27 NOTE — MAU Note (Signed)
Pt reports that she was discharged yesterday from having a c-section.  Pt states that she has been feeling very fatigued, extreme weakness,  her hands have swollen up like balloons, that it feels like something is sitting on her chest, and that she was not even able to hold her 6lb baby.  Last dose of ibuprofen was at midnight.

## 2020-03-27 NOTE — MAU Note (Signed)
Spoke with Leftwich-Kirby CNM, Dorie in radiology and Marissa (PP charge nurse).  Cyril Mourning will go with pt to MRI, to disconnect wound vac prior to MRI and reconnect after

## 2020-03-27 NOTE — Progress Notes (Signed)
Arrived at bedside to IV start.  Per bedside RN IV no loner needed.

## 2020-03-28 ENCOUNTER — Inpatient Hospital Stay (HOSPITAL_COMMUNITY): Admission: RE | Admit: 2020-03-28 | Payer: 59 | Source: Home / Self Care | Admitting: Obstetrics and Gynecology

## 2020-03-29 DIAGNOSIS — O135 Gestational [pregnancy-induced] hypertension without significant proteinuria, complicating the puerperium: Secondary | ICD-10-CM | POA: Diagnosis not present

## 2020-04-03 DIAGNOSIS — O165 Unspecified maternal hypertension, complicating the puerperium: Secondary | ICD-10-CM | POA: Diagnosis not present

## 2020-04-07 ENCOUNTER — Encounter: Payer: Self-pay | Admitting: Family Medicine

## 2020-04-11 DIAGNOSIS — Z23 Encounter for immunization: Secondary | ICD-10-CM | POA: Diagnosis not present

## 2020-04-18 DIAGNOSIS — O165 Unspecified maternal hypertension, complicating the puerperium: Secondary | ICD-10-CM | POA: Diagnosis not present

## 2020-04-24 ENCOUNTER — Telehealth (HOSPITAL_COMMUNITY): Payer: Self-pay

## 2020-04-24 ENCOUNTER — Telehealth (INDEPENDENT_AMBULATORY_CARE_PROVIDER_SITE_OTHER): Payer: 59 | Admitting: Lactation Services

## 2020-04-24 DIAGNOSIS — O9229 Other disorders of breast associated with pregnancy and the puerperium: Secondary | ICD-10-CM

## 2020-04-24 NOTE — Telephone Encounter (Signed)
-----   Message from Lenore Manner sent at 04/24/2020  3:19 PM EDT ----- Ms. Skeet Simmer called the lactation line at [facility] and requested an outpatient pumping appointment. She is having some nipple pain. I provided her with the number, but please call her to schedule, just in case.  Thank you so much!\ Heather

## 2020-04-24 NOTE — Telephone Encounter (Signed)
Called patient in regards to questions in regards to pumping. She reports there is pain on her nipples in between pumpings.  Infant with no signs of thrush per mom, he was seen by Pediatrician yesterday.  Patient reports her nipples are their normal pink color. The pain is also noted if her clothes or laying baby on the nipple area initially and the the pain will go away with time.   After pumping patient reports nipples are a lighter pink. They are swollen post pumping. She did get ATB during her c/s. Infant is 92 month old, he does not latch to the breast. They tried latching in the hospital but mom felt akward.   She is using a Nurse, adult. She is using the # 21 flanges. She reports no rubbing with pumping. She reports she was using the # 24 flanges and noted more swelling to the nipple area. She was pumping at about a 5 and she has backed it back down to a 4. She reports she got more milk on level 5.   She reports her milk supply is about 16 ounces a day. She pumps anytime infant gets a bottle. She started power pumping 3 x a day yesterday. The sensitivity was there prior to power pumping.   Patient was on Procardia for HTN post partum. She was on for 3 weeks and then stopped and then sensitivity to nipples started.   She is using Lanolin to nipples, reviewed risk of yeast with Lanolin use.   Sensitivity is better today but yesterday would not go away. She was having a burning and tingling sensation.   Reviewed vasospasms vs yeast as patient symptoms similar to both.   Plan of care communicated to patient:   1. Stop Lanolin 2. Apply Coconut oil with pumping 3. Apply Lotrimin AF or Monistat to the nipples after each pumping-thin layer for 2-3 weeks or until symptoms are gone 4. Can wear breast shells between pumping during day for air and to keep clothing off nipple.  5. [employer], towels, nursing pads daily in hot water and dry in hot dryer.  6. Try the # 24 flanges again 7.  Warm dry compresses post pumping for about 10 minutes before Lotrimin or Monistat 8. Do not freeze milk for later use 9. Decrease power pumping to 1 x a day id sensitivity increases.  10. Call as needed with questions or concerns.   Patient does not have referral in chart. Will fax tomorrow to Pediatricians office. Mom will be called once received back in office.

## 2020-04-24 NOTE — Telephone Encounter (Signed)
Alisha Wood called due to an issue with her hospital provided pump (she's a [employer] employee). I forwarded her question to [place] with lactation to follow up. She is also having some nipple sensitivity since she began pumping. She is interested in an OP appointment. I provided her with the contact information and also made a referral via Epic.

## 2020-05-08 ENCOUNTER — Other Ambulatory Visit: Payer: Self-pay | Admitting: Obstetrics and Gynecology

## 2020-05-18 ENCOUNTER — Other Ambulatory Visit: Payer: Self-pay

## 2020-05-18 ENCOUNTER — Ambulatory Visit (INDEPENDENT_AMBULATORY_CARE_PROVIDER_SITE_OTHER): Payer: 59 | Admitting: Family Medicine

## 2020-05-18 ENCOUNTER — Encounter: Payer: Self-pay | Admitting: Family Medicine

## 2020-05-18 VITALS — BP 126/84 | HR 98 | Temp 97.7°F | Ht 62.0 in | Wt 255.3 lb

## 2020-05-18 DIAGNOSIS — Z23 Encounter for immunization: Secondary | ICD-10-CM | POA: Diagnosis not present

## 2020-05-18 DIAGNOSIS — O10919 Unspecified pre-existing hypertension complicating pregnancy, unspecified trimester: Secondary | ICD-10-CM | POA: Diagnosis not present

## 2020-05-18 NOTE — Progress Notes (Signed)
This visit was conducted in person.  BP 126/84 (BP Location: Left Arm, Patient Position: Sitting, Cuff Size: Large)   Pulse 98   Temp 97.7 F (36.5 C) (Temporal)   Ht 5\' 2"  (1.575 m)   Wt 255 lb 5 oz (115.8 kg)   SpO2 98%   Breastfeeding Yes   BMI 46.70 kg/m    CC: 3 wk f/u visit  Subjective:    Patient ID: Alisha Wood, female    DOB: Aug 26, 1985, 34 y.o.   MRN: [id number]  HPI: Alisha Wood is a 34 y.o. female presenting on 05/18/2020 for Follow-up   Gestational hypertension - spiked after delivery 140/90s - started on procardia 30mg  - stopped this 1 month ago. BP staying stable since then.   No HA, vision changes, CP/tightness, SOB, leg swelling.   New baby at home (delivery 03/24/2020)! Last night slept through the night! 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  Husband back to work now.   Completed COVID vaccine Pfizer and overall tolerated well.      Relevant past medical, surgical, family and social history reviewed and updated as indicated. Interim medical history since our last visit reviewed. Allergies and medications reviewed and updated. Outpatient Medications Prior to Visit  Medication Sig Dispense Refill  . EPINEPHrine 0.3 mg/0.3 mL IJ SOAJ injection Inject 0.3 mLs (0.3 mg total) into the muscle as needed for anaphylaxis. 1 each 1  . pantoprazole (PROTONIX) 40 MG tablet Take 40 mg by mouth daily.    . Prenatal Vit-Fe Fumarate-FA (PRENATAL PO) Take 1 tablet by mouth daily.    . norethindrone (MICRONOR) 0.35 MG tablet Take 1 tablet by mouth daily. (Patient not taking: Reported on 05/18/2020)    . acetaminophen (TYLENOL) 500 MG tablet Take 2 tablets (1,000 mg total) by mouth every 6 (six) hours as needed. 100 tablet 2  . coconut oil OIL Apply 1 application topically as needed.  0  . ibuprofen (ADVIL) 800 MG tablet Take 1 tablet (800 mg total) by mouth every 6 (six) hours. 30 tablet 0  .  NIFEdipine (PROCARDIA-XL/NIFEDICAL-XL) 30 MG 24 hr tablet Take 1 tablet (30 mg total) by mouth daily. 30 tablet 1  . oxyCODONE (ROXICODONE) 5 MG immediate release tablet Take 1 tablet (5 mg total) by mouth every 8 (eight) hours as needed. 20 tablet 0  . senna-docusate (SENOKOT-S) 8.6-50 MG tablet Take 2 tablets by mouth daily.    . simethicone (MYLICON) 80 MG chewable tablet Chew 1 tablet (80 mg total) by mouth as needed for flatulence. 30 tablet 0   No facility-administered medications prior to visit.     Per HPI unless specifically indicated in ROS section below Review of Systems Objective:  BP 126/84 (BP Location: Left Arm, Patient Position: Sitting, Cuff Size: Large)   Pulse 98   Temp 97.7 F (36.5 C) (Temporal)   Ht 5\' 2"  (1.575 m)   Wt 255 lb 5 oz (115.8 kg)   SpO2 98%   Breastfeeding Yes   BMI 46.70 kg/m   Wt Readings from Last 3 Encounters:  05/18/20 255 lb 5 oz (115.8 kg)  03/27/20 267 lb 9.6 oz (121.4 kg)  03/21/20 274 lb (124.3 kg)      Physical Exam Vitals and nursing note reviewed.  Constitutional:      Appearance: Normal appearance. She is not ill-appearing.  Cardiovascular:     Rate and Rhythm: Normal rate and regular rhythm.  Pulses: Normal pulses.     Heart sounds: Normal heart sounds. No murmur heard.   Pulmonary:     Effort: Pulmonary effort is normal. No respiratory distress.     Breath sounds: Normal breath sounds. No wheezing, rhonchi or rales.  Musculoskeletal:     Right lower leg: No edema.     Left lower leg: No edema.  Skin:    General: Skin is warm and dry.     Findings: No rash.  Neurological:     Mental Status: She is alert.  Psychiatric:        Mood and Affect: Mood normal.        Behavior: Behavior normal.       Results for orders placed or performed during the hospital encounter of 03/27/20  CBC  Result Value Ref Range   WBC 9.0 4.0 - 10.5 K/uL   RBC 3.96 3.87 - 5.11 MIL/uL   Hemoglobin 12.3 12.0 - 15.0 g/dL   HCT 37.7 36 -  46 %   MCV 95.2 80.0 - 100.0 fL   MCH 31.1 26.0 - 34.0 pg   MCHC 32.6 30.0 - 36.0 g/dL   RDW 13.6 11.5 - 15.5 %   Platelets 291 150 - 400 K/uL   nRBC 0.0 0.0 - 0.2 %  Comprehensive metabolic panel  Result Value Ref Range   Sodium 142 135 - 145 mmol/L   Potassium 3.9 3.5 - 5.1 mmol/L   Chloride 105 98 - 111 mmol/L   CO2 25 22 - 32 mmol/L   Glucose, Bld 85 70 - 99 mg/dL   BUN 13 6 - 20 mg/dL   Creatinine, Ser 0.60 0.44 - 1.00 mg/dL   Calcium 9.4 8.9 - 10.3 mg/dL   Total Protein 6.4 (L) 6.5 - 8.1 g/dL   Albumin 2.8 (L) 3.5 - 5.0 g/dL   AST 22 15 - 41 U/L   ALT 17 0 - 44 U/L   Alkaline Phosphatase 68 38 - 126 U/L   Total Bilirubin 0.9 0.3 - 1.2 mg/dL   GFR calc non Af Amer >60 >60 mL/min   GFR calc Af Amer >60 >60 mL/min   Anion gap 12 5 - 15   Assessment & Plan:  This visit occurred during the SARS-CoV-2 public health emergency.  Safety protocols were in place, including screening questions prior to the visit, additional usage of staff PPE, and extensive cleaning of exam room while observing appropriate contact time as indicated for disinfecting solutions.   Problem List Items Addressed This Visit    Chronic hypertension affecting pregnancy - Primary    Gestational hypertension managed with procardia postpartum, now largely resolved with BP currently well controlled off medication. Will continue to monitor, reviewed low sodium and high potassium diet to keep BP under control.        Other Visit Diagnoses    Need for influenza vaccination       Relevant Orders   Flu Vaccine QUAD 36+ mos IM (Completed)       No orders of the defined types were placed in this encounter.  Orders Placed This Encounter  Procedures  . Flu Vaccine QUAD 36+ mos IM    Patient Instructions  Flu shot today  Blood pressures are doing well today off medication.  Limit salt /sodium in the diet to keep blood pressures under control, get diet rich in potassium.  Return as needed or in 3-4 months for  physical with labs.    Follow up plan:  Return in about 4 months (around 09/17/2020) for annual exam, prior fasting for blood work.  [name], MD

## 2020-05-18 NOTE — Assessment & Plan Note (Signed)
Gestational hypertension managed with procardia postpartum, now largely resolved with BP currently well controlled off medication. Will continue to monitor, reviewed low sodium and high potassium diet to keep BP under control.

## 2020-05-18 NOTE — Patient Instructions (Addendum)
Flu shot today  Blood pressures are doing well today off medication.  Limit salt /sodium in the diet to keep blood pressures under control, get diet rich in potassium.  Return as needed or in 3-4 months for physical with labs.

## 2020-05-30 ENCOUNTER — Encounter: Payer: Self-pay | Admitting: Family Medicine

## 2020-05-30 NOTE — Telephone Encounter (Signed)
Agree - would offer virtual tomorrow with available provider

## 2020-05-30 NOTE — Telephone Encounter (Signed)
Can you please try to get this pt scheduled for a MyChart visit with someone for cough and congestion?  Dr. Darnell Level has nothing.

## 2020-05-31 NOTE — Telephone Encounter (Signed)
Pt returned call, scheduled her w/Dr. Diona Browner 06/01/2020 at 10am virtual

## 2020-05-31 NOTE — Telephone Encounter (Signed)
lvm asking pt to call office so we can schedule her for a virtual.

## 2020-06-01 ENCOUNTER — Other Ambulatory Visit: Payer: Self-pay | Admitting: Family Medicine

## 2020-06-01 ENCOUNTER — Telehealth (INDEPENDENT_AMBULATORY_CARE_PROVIDER_SITE_OTHER): Payer: 59 | Admitting: Family Medicine

## 2020-06-01 DIAGNOSIS — J0100 Acute maxillary sinusitis, unspecified: Secondary | ICD-10-CM

## 2020-06-01 MED ORDER — AMOXICILLIN 500 MG PO CAPS: 1000.0000 mg | ORAL_CAPSULE | Freq: Two times a day (BID) | ORAL | 0 refills | Status: DC

## 2020-06-01 NOTE — Assessment & Plan Note (Signed)
Doubt COVID as recently vaccinated and negative COVID home test x 2.  Started as viral URI.Marland Kitchen now with new onset late in illness unilateral face pain and ear pain.. treat with antibiotics for possible bacterial sinusitis and otitis.  Use nasal saline and tylenol for symptoms.

## 2020-06-01 NOTE — Progress Notes (Signed)
VIRTUAL VISIT Due to national recommendations of social distancing due to [place] 19, a virtual visit is felt to be most appropriate for this patient at this time.   I connected with the patient on 06/01/20 at 10:00 AM EST by virtual telehealth platform and verified that I am speaking with the correct person using two identifiers.   I discussed the limitations, risks, security and privacy concerns of performing an evaluation and management service by  virtual telehealth platform and the availability of in person appointments. I also discussed with the patient that there may be a patient responsible charge related to this service. The patient expressed understanding and agreed to proceed.  Patient location: Home Provider Location: [facility] Participants: Eliezer Lofts and Lenox Ahr   Chief Complaint  Patient presents with  . Cough    with chest congestion... IS BREAST FEEDING...x 1 week.. has felt better since last week... pt has not tried OTC meds  . Sinusitis    facial pain and pressure clear--yellow nasal mucus.... right side  . Ear Fullness    right-- achy    History of Present Illness:  34 year old breast feeding female patient of Dr.G presents with new onset cough x 1 week.  Symptoms onset 05/25/2020 She reports she started with tickle in throat, burning in throat... lasted 2 days.  Progressed to  Nasal congestion, post nasal drip, sinus pressure and pain bilaterally.   34 year old has symptoms as well.   Now with chest congestion, rattly cough.   Now new unilateral face pain  and right ear pain in last 3 days.   No fever.  No myalgias.   COVID vaccine x 2 .  She has 2 rapid COVID test 11/6 and 11/7.   no chronic respiratory infection. Nonsmoker   Using tylenol.  COVID 19 screen No recent travel or known exposure to [place].. only has gone to doctors office.  The importance of social distancing was discussed today.   Review of Systems   Constitutional: Negative for chills and fever.  HENT: Positive for ear pain and sinus pain. Negative for congestion.   Eyes: Negative for pain and redness.  Respiratory: Positive for cough. Negative for shortness of breath.   Cardiovascular: Negative for chest pain, palpitations and leg swelling.  Gastrointestinal: Negative for abdominal pain, blood in stool, constipation, diarrhea, nausea and vomiting.  Genitourinary: Negative for dysuria.  Musculoskeletal: Negative for falls and myalgias.  Skin: Negative for rash.  Neurological: Negative for dizziness.  Psychiatric/Behavioral: Negative for depression. The patient is not nervous/anxious.       Past Medical History:  Diagnosis Date  . Angio-edema   . Back pain   . Fatty liver    per pt has resolved  . Heartburn   . Leg fracture 1995   right  . Migraines    common and optic, controlled with alleve  . NAFLD (nonalcoholic fatty liver disease) 03/2014   mild by [place], normal viral hep panel, iron levels, TSH  . PONV (postoperative nausea and vomiting)   . Postpartum care following cesarean delivery (7/19) 02/06/2015  . Pregnancy induced hypertension   . Severe obesity (BMI >= 40) ([place]) 08/30/2012   Saw nutritionist 06/2016  . Urticaria    graduation from high school. stress induced urticaria(only time)    reports that she has never smoked. She has never used smokeless tobacco. She reports that she does not drink alcohol and does not use drugs.   Current Outpatient Medications:  .  EPINEPHrine 0.3 mg/0.3 mL IJ SOAJ injection, Inject 0.3 mLs (0.3 mg total) into the muscle as needed for anaphylaxis., Disp: 1 each, Rfl: 1 .  Prenatal Vit-Fe Fumarate-FA (PRENATAL PO), Take 1 tablet by mouth daily., Disp: , Rfl:  .  norethindrone (MICRONOR) 0.35 MG tablet, Take 1 tablet by mouth daily.  (Patient not taking: Reported on 06/01/2020), Disp: , Rfl:  .  pantoprazole (PROTONIX) 40 MG tablet, Take 40 mg by mouth daily. (Patient not taking:  Reported on 06/01/2020), Disp: , Rfl:    Observations/Objective: currently breastfeeding.  Physical Exam  Physical Exam Constitutional:      General: The patient is not in acute distress. Pulmonary:     Effort: Pulmonary effort is normal. No respiratory distress.  Neurological:     Mental Status: The patient is alert and oriented to person, place, and time.  Psychiatric:        Mood and Affect: Mood normal.        Behavior: Behavior normal.   Assessment and Plan   Acute non-recurrent maxillary sinusitis  Doubt COVID as recently vaccinated and negative COVID home test x 2.  Started as viral URI.Marland Kitchen now with new onset late in illness unilateral face pain and ear pain.. treat with antibiotics for possible bacterial sinusitis and otitis.  Use nasal saline and tylenol for symptoms.   I discussed the assessment and treatment plan with the patient. The patient was provided an opportunity to ask questions and all were answered. The patient agreed with the plan and demonstrated an understanding of the instructions.   The patient was advised to call back or seek an in-person evaluation if the symptoms worsen or if the condition fails to improve as anticipated.     Eliezer Lofts, MD

## 2020-06-28 ENCOUNTER — Other Ambulatory Visit: Payer: Self-pay | Admitting: Obstetrics & Gynecology

## 2020-06-28 DIAGNOSIS — O9122 Nonpurulent mastitis associated with the puerperium: Secondary | ICD-10-CM | POA: Diagnosis not present

## 2020-06-29 ENCOUNTER — Other Ambulatory Visit: Payer: Self-pay | Admitting: Certified Nurse Midwife

## 2020-07-18 DIAGNOSIS — H52222 Regular astigmatism, left eye: Secondary | ICD-10-CM | POA: Diagnosis not present

## 2020-07-18 DIAGNOSIS — H5203 Hypermetropia, bilateral: Secondary | ICD-10-CM | POA: Diagnosis not present

## 2020-09-08 ENCOUNTER — Other Ambulatory Visit: Payer: Self-pay | Admitting: Family Medicine

## 2020-09-08 DIAGNOSIS — O10919 Unspecified pre-existing hypertension complicating pregnancy, unspecified trimester: Secondary | ICD-10-CM

## 2020-09-08 DIAGNOSIS — E559 Vitamin D deficiency, unspecified: Secondary | ICD-10-CM

## 2020-09-12 ENCOUNTER — Other Ambulatory Visit (INDEPENDENT_AMBULATORY_CARE_PROVIDER_SITE_OTHER): Payer: 59

## 2020-09-12 ENCOUNTER — Other Ambulatory Visit: Payer: Self-pay

## 2020-09-12 DIAGNOSIS — O10919 Unspecified pre-existing hypertension complicating pregnancy, unspecified trimester: Secondary | ICD-10-CM

## 2020-09-12 DIAGNOSIS — E559 Vitamin D deficiency, unspecified: Secondary | ICD-10-CM

## 2020-09-12 LAB — COMPREHENSIVE METABOLIC PANEL
ALT: 23 U/L (ref 0–35)
AST: 18 U/L (ref 0–37)
Albumin: 4.1 g/dL (ref 3.5–5.2)
Alkaline Phosphatase: 96 U/L (ref 39–117)
BUN: 18 mg/dL (ref 6–23)
CO2: 24 mEq/L (ref 19–32)
Calcium: 9.4 mg/dL (ref 8.4–10.5)
Chloride: 107 mEq/L (ref 96–112)
Creatinine, Ser: 0.66 mg/dL (ref 0.40–1.20)
GFR: 113.56 mL/min (ref >60.00)
Glucose, Bld: 91 mg/dL (ref 70–99)
Potassium: 3.9 mEq/L (ref 3.5–5.1)
Sodium: 140 mEq/L (ref 135–145)
Total Bilirubin: 0.5 mg/dL (ref 0.2–1.2)
Total Protein: 7.3 g/dL (ref 6.0–8.3)

## 2020-09-12 LAB — LIPID PANEL
Cholesterol: 183 mg/dL (ref 0–200)
HDL: 52.6 mg/dL (ref >39.00)
LDL Cholesterol: 114 mg/dL — ABNORMAL HIGH (ref 0–99)
NonHDL: 130.05
Total CHOL/HDL Ratio: 3
Triglycerides: 79 mg/dL (ref 0.0–149.0)
VLDL: 15.8 mg/dL (ref 0.0–40.0)

## 2020-09-12 LAB — VITAMIN D 25 HYDROXY (VIT D DEFICIENCY, FRACTURES): VITD: 22.51 ng/mL — ABNORMAL LOW (ref 30.00–100.00)

## 2020-09-12 LAB — MICROALBUMIN / CREATININE URINE RATIO
Creatinine,U: 204.6 mg/dL
Microalb Creat Ratio: 6.7 mg/g (ref 0.0–30.0)
Microalb, Ur: 13.8 mg/dL — ABNORMAL HIGH (ref 0.0–1.9)

## 2020-09-19 ENCOUNTER — Ambulatory Visit (INDEPENDENT_AMBULATORY_CARE_PROVIDER_SITE_OTHER): Payer: 59 | Admitting: Family Medicine

## 2020-09-19 ENCOUNTER — Other Ambulatory Visit: Payer: Self-pay

## 2020-09-19 ENCOUNTER — Encounter: Payer: Self-pay | Admitting: Family Medicine

## 2020-09-19 VITALS — BP 130/82 | HR 90 | Temp 97.9°F | Ht 61.5 in | Wt 257.3 lb

## 2020-09-19 DIAGNOSIS — E559 Vitamin D deficiency, unspecified: Secondary | ICD-10-CM

## 2020-09-19 DIAGNOSIS — Z0000 Encounter for general adult medical examination without abnormal findings: Secondary | ICD-10-CM | POA: Diagnosis not present

## 2020-09-19 MED ORDER — VITAMIN D3 25 MCG (1000 UT) PO CAPS: 1.0000 | ORAL_CAPSULE | Freq: Every day | ORAL | Status: DC

## 2020-09-19 NOTE — Assessment & Plan Note (Signed)
Encouraged healthy diet and lifestyle changes to affect sustainable weight loss. Recommend 5% weight loss - which would be 12lbs for her at this time. Discussed goal healthy BMI.

## 2020-09-19 NOTE — Assessment & Plan Note (Signed)
rec start 1000 IU daily.

## 2020-09-19 NOTE — Progress Notes (Signed)
Patient ID: Alisha Wood, female    DOB: [birth year], 35 y.o.   MRN: [id number]  This visit was conducted in person.  BP 130/82   Pulse 90   Temp 97.9 F (36.6 C) (Temporal)   Ht 5' 1.5" (1.562 m)   Wt 257 lb 5 oz (116.7 kg)   SpO2 98%   Breastfeeding Yes   BMI 47.83 kg/m   BP Readings from Last 3 Encounters:  09/19/20 130/82  05/18/20 126/84  03/27/20 (!) 141/86    CC: CPE Subjective:   HPI: Alisha Wood is a 35 y.o. female presenting on 09/19/2020 for Annual Exam   Has had elevated insulin levels in the past. Tried weight watchers after recent delivery - but this cut breast milk production down, so now off this.   Preventative: Well woman exam yearly at [facility]) with normal pap 2021 Flu shot yearly [place] 03/2020, 04/2020  Tdap 2016  MMR completed (2 vaccines) Seat belt use discussed Sunscreen use discussed. No changing moles on skin.  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 Interim medical history since our last visit reviewed. Allergies and medications reviewed and updated. Outpatient Medications Prior to Visit  Medication Sig Dispense Refill  . EPINEPHrine 0.3 mg/0.3 mL IJ SOAJ injection Inject 0.3 mLs (0.3 mg total) into the muscle as needed for anaphylaxis. 1 each 1  . pantoprazole (PROTONIX) 40 MG tablet Take 40 mg by mouth daily.    Marland Kitchen amoxicillin (AMOXIL) 500 MG capsule Take 2 capsules (1,000 mg total) by mouth 2 (two) times daily. 40 capsule 0  . norethindrone (MICRONOR) 0.35 MG tablet Take 1 tablet by mouth daily.    .  Prenatal Vit-Fe Fumarate-FA (PRENATAL PO) Take 1 tablet by mouth daily.     No facility-administered medications prior to visit.     Per HPI unless specifically indicated in ROS section below Review of Systems  Constitutional: Negative for activity change, appetite change, chills, fatigue, fever and unexpected weight change.  HENT: Negative for hearing loss.   Eyes: Negative for visual disturbance.  Respiratory: Negative for cough, chest tightness, shortness of breath and wheezing.   Cardiovascular: Negative for chest pain, palpitations and leg swelling.  Gastrointestinal: Negative for abdominal distention, abdominal pain, blood in stool, constipation, diarrhea, nausea and vomiting.  Genitourinary: Negative for difficulty urinating and hematuria.  Musculoskeletal: Negative for arthralgias, myalgias and neck pain.  Skin: Negative for rash.  Neurological: Negative for dizziness, seizures, syncope and headaches.  Hematological: Negative for adenopathy. Does not bruise/bleed easily.  Psychiatric/Behavioral: Negative for dysphoric mood. The patient is not nervous/anxious.    Objective:  BP 130/82   Pulse 90   Temp 97.9 F (36.6 C) (Temporal)   Ht 5' 1.5" (1.562 m)   Wt 257 lb 5 oz (116.7 kg)   SpO2 98%   Breastfeeding Yes   BMI 47.83 kg/m   Wt Readings from Last 3 Encounters:  09/19/20 257 lb 5 oz (116.7 kg)  05/18/20 255 lb 5 oz (115.8 kg)  03/27/20 267 lb 9.6 oz (121.4  kg)      Physical Exam Vitals and nursing note reviewed.  Constitutional:      General: She is not in acute distress.    Appearance: Normal appearance. She is well-developed and well-nourished. She is not ill-appearing.  HENT:     Head: Normocephalic and atraumatic.     Right Ear: Hearing, tympanic membrane, ear canal and external ear normal.     Left Ear: Hearing, tympanic membrane, ear canal and external ear normal.     Mouth/Throat:     Mouth: Oropharynx is clear and moist and mucous membranes are normal.      Pharynx: No posterior oropharyngeal edema.  Eyes:     General: No scleral icterus.    Extraocular Movements: Extraocular movements intact and EOM normal.     Conjunctiva/sclera: Conjunctivae normal.     Pupils: Pupils are equal, round, and reactive to light.  Neck:     Thyroid: No thyroid mass or thyromegaly.  Cardiovascular:     Rate and Rhythm: Normal rate and regular rhythm.     Pulses: Normal pulses and intact distal pulses.          Radial pulses are 2+ on the right side and 2+ on the left side.     Heart sounds: Normal heart sounds. No murmur heard.   Pulmonary:     Effort: Pulmonary effort is normal. No respiratory distress.     Breath sounds: Normal breath sounds. No wheezing, rhonchi or rales.  Abdominal:     General: Bowel sounds are normal. There is no distension.     Palpations: Abdomen is soft. There is no mass.     Tenderness: There is no abdominal tenderness. There is no guarding or rebound.     Hernia: No hernia is present.  Musculoskeletal:        General: No edema. Normal range of motion.     Cervical back: Normal range of motion and neck supple.     Right lower leg: No edema.     Left lower leg: No edema.  Lymphadenopathy:     Cervical: No cervical adenopathy.  Skin:    General: Skin is warm and dry.     Findings: No rash.  Neurological:     General: No focal deficit present.     Mental Status: She is alert and oriented to person, place, and time.     Comments: CN grossly intact, station and gait intact  Psychiatric:        Mood and Affect: Mood and affect and mood normal.        Behavior: Behavior normal.        Thought Content: Thought content normal.        Judgment: Judgment normal.       Results for orders placed or performed in visit on 09/12/20  Microalbumin / creatinine urine ratio  Result Value Ref Range   Microalb, Ur 13.8 (H) 0.0 - 1.9 mg/dL   Creatinine,U 204.6 mg/dL   Microalb Creat Ratio 6.7 0.0 - 30.0 mg/g  VITAMIN D 25 Hydroxy  (Vit-D Deficiency, Fractures)  Result Value Ref Range   VITD 22.51 (L) 30.00 - 100.00 ng/mL  Comprehensive metabolic panel  Result Value Ref Range   Sodium 140 135 - 145 mEq/L   Potassium 3.9 3.5 - 5.1 mEq/L   Chloride 107 96 - 112 mEq/L   CO2 24 19 - 32 mEq/L   Glucose, Bld 91 70 - 99 mg/dL   BUN 18 6 -  23 mg/dL   Creatinine, Ser 0.66 0.40 - 1.20 mg/dL   Total Bilirubin 0.5 0.2 - 1.2 mg/dL   Alkaline Phosphatase 96 39 - 117 U/L   AST 18 0 - 37 U/L   ALT 23 0 - 35 U/L   Total Protein 7.3 6.0 - 8.3 g/dL   Albumin 4.1 3.5 - 5.2 g/dL   GFR 113.56 >60.00 mL/min   Calcium 9.4 8.4 - 10.5 mg/dL  Lipid panel  Result Value Ref Range   Cholesterol 183 0 - 200 mg/dL   Triglycerides 79.0 0.0 - 149.0 mg/dL   HDL 52.60 >39.00 mg/dL   VLDL 15.8 0.0 - 40.0 mg/dL   LDL Cholesterol 114 (H) 0 - 99 mg/dL   Total CHOL/HDL Ratio 3    NonHDL 130.05    Assessment & Plan:  This visit occurred during the SARS-CoV-2 public health emergency.  Safety protocols were in place, including screening questions prior to the visit, additional usage of staff PPE, and extensive cleaning of exam room while observing appropriate contact time as indicated for disinfecting solutions.   Problem List Items Addressed This Visit    Vitamin D deficiency    rec start 1000 IU daily.       Obesity, morbid, BMI 40.0-49.9 ([place])    Encouraged healthy diet and lifestyle changes to affect sustainable weight loss. Recommend 5% weight loss - which would be 12lbs for her at this time. Discussed goal healthy BMI.       Health maintenance examination - Primary    Preventative protocols reviewed and updated unless pt declined. Discussed healthy diet and lifestyle.           Meds ordered this encounter  Medications  . Cholecalciferol (VITAMIN D3) 25 MCG (1000 UT) CAPS    Sig: Take 1 capsule (1,000 Units total) by mouth daily.    Dispense:  30 capsule   No orders of the defined types were placed in this  encounter.   Patient instructions: You are doing well today Return as needed or in 1 year for next physical.  Monitor blood pressures at home, limit salt, get plenty of water and fruits/vegetables for potassium rich diet.   Follow up plan: Return in about 1 year (around 09/19/2021).  [name], MD

## 2020-09-19 NOTE — Assessment & Plan Note (Signed)
Preventative protocols reviewed and updated unless pt declined. Discussed healthy diet and lifestyle.  

## 2020-09-19 NOTE — Patient Instructions (Signed)
You are doing well today Return as needed or in 1 year for next physical.  Monitor blood pressures at home, limit salt, get plenty of water and fruits/vegetables for potassium rich diet.   Health Maintenance, Female Adopting a healthy lifestyle and getting preventive care are important in promoting health and wellness. Ask your health care provider about:  The right schedule for you to have regular tests and exams.  Things you can do on your own to prevent diseases and keep yourself healthy. What should I know about diet, weight, and exercise? Eat a healthy diet  Eat a diet that includes plenty of vegetables, fruits, low-fat dairy products, and lean protein.  Do not eat a lot of foods that are high in solid fats, added sugars, or sodium.   Maintain a healthy weight Body mass index (BMI) is used to identify weight problems. It estimates body fat based on height and weight. Your health care provider can help determine your BMI and help you achieve or maintain a healthy weight. Get regular exercise Get regular exercise. This is one of the most important things you can do for your health. Most adults should:  Exercise for at least 150 minutes each week. The exercise should increase your heart rate and make you sweat (moderate-intensity exercise).  Do strengthening exercises at least twice a week. This is in addition to the moderate-intensity exercise.  Spend less time sitting. Even light physical activity can be beneficial. Watch cholesterol and blood lipids Have your blood tested for lipids and cholesterol at 36 years of age, then have this test every 5 years. Have your cholesterol levels checked more often if:  Your lipid or cholesterol levels are high.  You are older than 36 years of age.  You are at high risk for heart disease. What should I know about cancer screening? Depending on your health history and family history, you may need to have cancer screening at various ages. This  may include screening for:  Breast cancer.  Cervical cancer.  Colorectal cancer.  Skin cancer.  Lung cancer. What should I know about heart disease, diabetes, and high blood pressure? Blood pressure and heart disease  High blood pressure causes heart disease and increases the risk of stroke. This is more likely to develop in people who have high blood pressure readings, are of African descent, or are overweight.  Have your blood pressure checked: ? Every 3-5 years if you are 67-81 years of age. ? Every year if you are 81 years old or older. Diabetes Have regular diabetes screenings. This checks your fasting blood sugar level. Have the screening done:  Once every three years after age 32 if you are at a normal weight and have a low risk for diabetes.  More often and at a younger age if you are overweight or have a high risk for diabetes. What should I know about preventing infection? Hepatitis B If you have a higher risk for hepatitis B, you should be screened for this virus. Talk with your health care provider to find out if you are at risk for hepatitis B infection. Hepatitis C Testing is recommended for:  Everyone born from [birth year] through 1966.  Anyone with known risk factors for hepatitis C. Sexually transmitted infections (STIs)  Get screened for STIs, including gonorrhea and chlamydia, if: ? You are sexually active and are younger than 36 years of age. ? You are older than 36 years of age and your health care provider tells you that  you are at risk for this type of infection. ? Your sexual activity has changed since you were last screened, and you are at increased risk for chlamydia or gonorrhea. Ask your health care provider if you are at risk.  Ask your health care provider about whether you are at high risk for HIV. Your health care provider may recommend a prescription medicine to help prevent HIV infection. If you choose to take medicine to prevent HIV, you should  first get tested for HIV. You should then be tested every 3 months for as long as you are taking the medicine. Pregnancy  If you are about to stop having your period (premenopausal) and you may become pregnant, seek counseling before you get pregnant.  Take 400 to 800 micrograms (mcg) of folic acid every day if you become pregnant.  Ask for birth control (contraception) if you want to prevent pregnancy. Osteoporosis and menopause Osteoporosis is a disease in which the bones lose minerals and strength with aging. This can result in bone fractures. If you are 71 years old or older, or if you are at risk for osteoporosis and fractures, ask your health care provider if you should:  Be screened for bone loss.  Take a calcium or vitamin D supplement to lower your risk of fractures.  Be given hormone replacement therapy (HRT) to treat symptoms of menopause. Follow these instructions at home: Lifestyle  Do not use any products that contain nicotine or tobacco, such as cigarettes, e-cigarettes, and chewing tobacco. If you need help quitting, ask your health care provider.  Do not use street drugs.  Do not share needles.  Ask your health care provider for help if you need support or information about quitting drugs. Alcohol use  Do not drink alcohol if: ? Your health care provider tells you not to drink. ? You are pregnant, may be pregnant, or are planning to become pregnant.  If you drink alcohol: ? Limit how much you use to 0-1 drink a day. ? Limit intake if you are breastfeeding.  Be aware of how much alcohol is in your drink. In the [place] one drink equals one 12 oz bottle of beer (355 mL), one 5 oz glass of wine (148 mL), or one 1 oz glass of hard liquor (44 mL). General instructions  Schedule regular health, dental, and eye exams.  Stay current with your vaccines.  Tell your health care provider if: ? You often feel depressed. ? You have ever been abused or do not feel safe  at home. Summary  Adopting a healthy lifestyle and getting preventive care are important in promoting health and wellness.  Follow your health care provider's instructions about healthy diet, exercising, and getting tested or screened for diseases.  Follow your health care provider's instructions on monitoring your cholesterol and blood pressure. This information is not intended to replace advice given to you by your health care provider. Make sure you discuss any questions you have with your health care provider. Document Revised: 06/30/2018 Document Reviewed: 06/30/2018 Elsevier Patient Education  2021 [employer].

## 2021-01-16 ENCOUNTER — Other Ambulatory Visit: Payer: Self-pay

## 2021-01-16 MED ORDER — CARESTART COVID-19 HOME TEST VI KIT
PACK | 0 refills | Status: DC
  Filled 2021-01-16: qty 2, 4d supply, fill #0

## 2021-02-12 DIAGNOSIS — Z20822 Contact with and (suspected) exposure to covid-19: Secondary | ICD-10-CM | POA: Diagnosis not present

## 2021-02-18 ENCOUNTER — Telehealth (INDEPENDENT_AMBULATORY_CARE_PROVIDER_SITE_OTHER): Payer: 59 | Admitting: Nurse Practitioner

## 2021-02-18 ENCOUNTER — Other Ambulatory Visit: Payer: Self-pay

## 2021-02-18 ENCOUNTER — Encounter: Payer: Self-pay | Admitting: Nurse Practitioner

## 2021-02-18 VITALS — Ht 61.5 in | Wt 257.0 lb

## 2021-02-18 DIAGNOSIS — R062 Wheezing: Secondary | ICD-10-CM | POA: Diagnosis not present

## 2021-02-18 DIAGNOSIS — R059 Cough, unspecified: Secondary | ICD-10-CM | POA: Diagnosis not present

## 2021-02-18 DIAGNOSIS — B349 Viral infection, unspecified: Secondary | ICD-10-CM | POA: Insufficient documentation

## 2021-02-18 MED ORDER — ALBUTEROL SULFATE HFA 108 (90 BASE) MCG/ACT IN AERS
2.0000 | INHALATION_SPRAY | Freq: Four times a day (QID) | RESPIRATORY_TRACT | 0 refills | Status: DC | PRN
  Filled 2021-02-18: qty 8.5, 25d supply, fill #0

## 2021-02-18 NOTE — Patient Instructions (Signed)
Sent inhaler to pharmacy of request.  If symptoms fail to improve or worsen please reach out to me via My Chart and we can get you in for an office visit and exam.

## 2021-02-18 NOTE — Assessment & Plan Note (Signed)
Patient likely dealing with viral illness.  States all family is dealing with illness.  Recently took kid pediatrician and likely diagnosis of RSV.

## 2021-02-18 NOTE — Assessment & Plan Note (Signed)
Likely due to viral etiology.  Did discuss with patient that viral coughs can last upwards to 4 weeks.  Important pieces monitoring that it is improving.  Patient does state on occasion she had does have sputum production.  Given the patient is breast-feeding we had a discussion and we will hold off on any type of cough suppressants at the current time.

## 2021-02-18 NOTE — Assessment & Plan Note (Signed)
Noticed wheezing intermittently.  And some dyspnea on exertion.  Patient is not a smoker nor has never been.  Denies any lung disease.  We will start albuterol inhaler every 6 hours as needed for wheezing or shortness of breath.

## 2021-02-18 NOTE — Progress Notes (Signed)
Patient ID: Alisha Wood, female    DOB: Aug 21, 1985, 35 y.o.   MRN: [id number]  Virtual visit completed through [place], a video enabled telemedicine application. Due to national recommendations of social distancing due to COVID-19, a virtual visit is felt to be most appropriate for this patient at this time. Reviewed limitations, risks, security and privacy concerns of performing a virtual visit and the availability of in person appointments. I also reviewed that there may be a patient responsible charge related to this service. The patient agreed to proceed.   Patient location: home Provider location: [place] at [facility], office Persons participating in this virtual visit: patient, provider   If any vitals were documented, they were collected by patient at home unless specified below.    Ht 5' 1.5" (1.562 m)   Wt 257 lb (116.6 kg)   BMI 47.77 kg/m    CC: Cough Subjective:   HPI: Alisha Wood is a 35 y.o. female presenting on 02/18/2021 for an acute video visit.  Patient states starting Monday, 02-11-2021 started having sinus pressure, the progressively got worse through Wednesday.  Patient states she took a COVID PCR test on Tuesday, 02-12-2021 eventually came back negative also tested for flu AMB which were also negative.  While patient was waiting for PCR test result she took a home COVID test on Thursday which resulted negative. The before symptoms have improved patient's biggest complaint is a cough.  Did state that the whole house is dealing with a viral illness illness.  States took her youngest to the pediatrician and was likely diagnosed with RSV.  Patient endorsed having some wheezing and shortness of breath on exertion when doing activities.  She is a non-smoker, never has smoked.  Denies any lung disease or disorders.  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    Relevant past medical, surgical, family and social history  reviewed and updated as indicated. Interim medical history since our last visit reviewed. Allergies and medications reviewed and updated. Outpatient Medications Prior to Visit  Medication Sig Dispense Refill   EPINEPHrine 0.3 mg/0.3 mL IJ SOAJ injection Inject 0.3 mLs (0.3 mg total) into the muscle as needed for anaphylaxis. 1 each 1   Prenatal Vit-Fe Fumarate-FA (PRENATAL MULTIVITAMIN) TABS tablet Take 1 tablet by mouth daily at 12 noon.     cephALEXin (KEFLEX) 500 MG capsule TAKE 1 CAPSULE BY MOUTH EVERY 6 HOURS FOR 7 DAYS 28 capsule 0   Cholecalciferol (VITAMIN D3) 25 MCG (1000 UT) CAPS Take 1 capsule (1,000 Units total) by mouth daily. 30 capsule    COVID-19 At Home Antigen Test (CARESTART COVID-19 HOME TEST) KIT use as directed 2 kit 0   dicloxacillin (DYNAPEN) 500 MG capsule TAKE 1 CAPSULE BY MOUTH EVERY 6 HOURS FOR 10 DAYS 40 capsule 0   norethindrone (MICRONOR) 0.35 MG tablet TAKE 1 TABLET BY MOUTH DAILY 84 tablet 3   pantoprazole (PROTONIX) 40 MG tablet Take 40 mg by mouth daily.     pantoprazole (PROTONIX) 40 MG tablet TAKE 1 TABLET BY MOUTH DAILY 30 tablet 11   No facility-administered medications prior to visit.     Per HPI unless specifically indicated in ROS section below Review of Systems  Constitutional:  Negative for chills and fever.  HENT:  Negative for ear discharge, ear pain, nosebleeds and sinus pressure.   Respiratory:  Positive for cough, shortness of breath and wheezing.   Gastrointestinal:  Negative  for abdominal pain, diarrhea, nausea and vomiting.  Objective:  Ht 5' 1.5" (1.562 m)   Wt 257 lb (116.6 kg)   BMI 47.77 kg/m   Wt Readings from Last 3 Encounters:  02/18/21 257 lb (116.6 kg)  09/19/20 257 lb 5 oz (116.7 kg)  05/18/20 255 lb 5 oz (115.8 kg)       Physical exam: Gen: alert, NAD, not ill appearing Pulm: speaks in complete sentences without increased work of breathing Psych: normal mood, normal thought content      Results for orders placed  or performed in visit on 09/12/20  Microalbumin / creatinine urine ratio  Result Value Ref Range   Microalb, Ur 13.8 (H) 0.0 - 1.9 mg/dL   Creatinine,U 204.6 mg/dL   Microalb Creat Ratio 6.7 0.0 - 30.0 mg/g  VITAMIN D 25 Hydroxy (Vit-D Deficiency, Fractures)  Result Value Ref Range   VITD 22.51 (L) 30.00 - 100.00 ng/mL  Comprehensive metabolic panel  Result Value Ref Range   Sodium 140 135 - 145 mEq/L   Potassium 3.9 3.5 - 5.1 mEq/L   Chloride 107 96 - 112 mEq/L   CO2 24 19 - 32 mEq/L   Glucose, Bld 91 70 - 99 mg/dL   BUN 18 6 - 23 mg/dL   Creatinine, Ser 0.66 0.40 - 1.20 mg/dL   Total Bilirubin 0.5 0.2 - 1.2 mg/dL   Alkaline Phosphatase 96 39 - 117 U/L   AST 18 0 - 37 U/L   ALT 23 0 - 35 U/L   Total Protein 7.3 6.0 - 8.3 g/dL   Albumin 4.1 3.5 - 5.2 g/dL   GFR 113.56 >60.00 mL/min   Calcium 9.4 8.4 - 10.5 mg/dL  Lipid panel  Result Value Ref Range   Cholesterol 183 0 - 200 mg/dL   Triglycerides 79.0 0.0 - 149.0 mg/dL   HDL 52.60 >39.00 mg/dL   VLDL 15.8 0.0 - 40.0 mg/dL   LDL Cholesterol 114 (H) 0 - 99 mg/dL   Total CHOL/HDL Ratio 3    NonHDL 130.05    Assessment & Plan:   Problem List Items Addressed This Visit       Other   Wheezing    Noticed wheezing intermittently.  And some dyspnea on exertion.  Patient is not a smoker nor has never been.  Denies any lung disease.  We will start albuterol inhaler every 6 hours as needed for wheezing or shortness of breath.       Relevant Medications   albuterol (VENTOLIN HFA) 108 (90 Base) MCG/ACT inhaler   Viral illness    Patient likely dealing with viral illness.  States all family is dealing with illness.  Recently took kid pediatrician and likely diagnosis of RSV.       Cough - Primary    Likely due to viral etiology.  Did discuss with patient that viral coughs can last upwards to 4 weeks.  Important pieces monitoring that it is improving.  Patient does state on occasion she had does have sputum production.  Given  the patient is breast-feeding we had a discussion and we will hold off on any type of cough suppressants at the current time.         No orders of the defined types were placed in this encounter.  No orders of the defined types were placed in this encounter.   I discussed the assessment and treatment plan with the patient. The patient was provided an opportunity to ask questions and all  were answered. The patient agreed with the plan and demonstrated an understanding of the instructions. The patient was advised to call back or seek an in-person evaluation if the symptoms worsen or if the condition fails to improve as anticipated.  Follow up plan: Return if symptoms worsen or fail to improve.  This visit occurred during the SARS-CoV-2 public health emergency.  Safety protocols were in place, including screening questions prior to the visit, additional usage of staff PPE, and extensive cleaning of exam room while observing appropriate contact time as indicated for disinfecting solutions.   Romilda Garret, NP

## 2021-02-25 ENCOUNTER — Encounter: Payer: Self-pay | Admitting: Family Medicine

## 2021-03-07 DIAGNOSIS — Z23 Encounter for immunization: Secondary | ICD-10-CM | POA: Diagnosis not present

## 2021-03-26 ENCOUNTER — Encounter: Payer: Self-pay | Admitting: Family Medicine

## 2021-04-29 ENCOUNTER — Ambulatory Visit: Payer: 59 | Admitting: Family Medicine

## 2021-05-03 ENCOUNTER — Other Ambulatory Visit: Payer: Self-pay

## 2021-05-03 ENCOUNTER — Encounter: Payer: Self-pay | Admitting: Family Medicine

## 2021-05-03 ENCOUNTER — Ambulatory Visit: Payer: 59 | Admitting: Family Medicine

## 2021-05-03 DIAGNOSIS — Z6841 Body Mass Index (BMI) 40.0 and over, adult: Secondary | ICD-10-CM | POA: Diagnosis not present

## 2021-05-03 DIAGNOSIS — Z91013 Allergy to seafood: Secondary | ICD-10-CM | POA: Insufficient documentation

## 2021-05-03 DIAGNOSIS — Z23 Encounter for immunization: Secondary | ICD-10-CM | POA: Diagnosis not present

## 2021-05-03 MED ORDER — SAXENDA 18 MG/3ML ~~LOC~~ SOPN
PEN_INJECTOR | SUBCUTANEOUS | 0 refills | Status: AC
  Filled 2021-05-03: qty 15, 30d supply, fill #0

## 2021-05-03 MED ORDER — EPINEPHRINE 0.3 MG/0.3ML IJ SOAJ
0.3000 mg | INTRAMUSCULAR | 1 refills | Status: DC | PRN
  Filled 2021-05-03: qty 2, 30d supply, fill #0

## 2021-05-03 NOTE — Progress Notes (Signed)
Patient ID: Alisha Wood, female    DOB: 1986-04-06, 35 y.o.   MRN: [id number]  This visit was conducted in person.  BP 122/84   Pulse 85   Temp 97.8 F (36.6 C) (Temporal)   Ht 5' 1.5" (1.562 m)   Wt 276 lb 5 oz (125.3 kg)   SpO2 97%   Breastfeeding No   BMI 51.36 kg/m    CC: weight management  Subjective:   HPI: Alisha Wood is a 35 y.o. female presenting on 05/03/2021 for Weight Management ([place].  Caused diarrhea, fatigue and dizziness.  Wants to discuss other wt loss options. )   Starting weight: 276 lbs Last weight: 257 lbs  Today's weight 276 lbs   Previously followed [employer] diet.  Recent trial Optavia meal replacement diet stopped due to diarrhea, fatigue, dizziness.  Trouble with weight watchers as well due to decreased breast milk reduction. However she's stopped breastfeeding as of 3 wks ago.   Intolerance to wellbutrin in the past (dizziness, didn't feel well with this). Phentermine caused trouble with insomnia. Avoiding topamax as on OCP. Mother did not tolerate saxenda (nausea).  She has previously seen nutritionist.  Has had elevated insulin levels in the past.   24 hour recall: Skipped breakfast  1:30pm lunch: taco from [employer] + fiesta potatoes, diet coke  5pm dinner: chicken pollo loco salad (beans, chicken, tortilla, some lettuce), sweet tea 40oz Limited water intake  Activity regimen: No regular exercise routine.  considering starting walking routine.      Relevant past medical, surgical, family and social history reviewed and updated as indicated. Interim medical history since our last visit reviewed. Allergies and medications reviewed and updated. Outpatient Medications Prior to Visit  Medication Sig Dispense Refill   EPINEPHrine 0.3 mg/0.3 mL IJ SOAJ injection Inject 0.3 mLs (0.3 mg total) into the muscle as needed for anaphylaxis. 1 each 1   albuterol (VENTOLIN HFA) 108 (90 Base) MCG/ACT inhaler Inhale  2 puffs into the lungs every 6 (six) hours as needed for wheezing or shortness of breath. 8.5 g 0   Prenatal Vit-Fe Fumarate-FA (PRENATAL MULTIVITAMIN) TABS tablet Take 1 tablet by mouth daily at 12 noon.     No facility-administered medications prior to visit.     Per HPI unless specifically indicated in ROS section below Review of Systems  Objective:  BP 122/84   Pulse 85   Temp 97.8 F (36.6 C) (Temporal)   Ht 5' 1.5" (1.562 m)   Wt 276 lb 5 oz (125.3 kg)   SpO2 97%   Breastfeeding No   BMI 51.36 kg/m   Wt Readings from Last 3 Encounters:  05/03/21 276 lb 5 oz (125.3 kg)  02/18/21 257 lb (116.6 kg)  09/19/20 257 lb 5 oz (116.7 kg)      Physical Exam Vitals and nursing note reviewed.  Constitutional:      Appearance: Normal appearance. She is obese. She is not ill-appearing.  Eyes:     Extraocular Movements: Extraocular movements intact.     Pupils: Pupils are equal, round, and reactive to light.  Neck:     Thyroid: No thyroid mass or thyromegaly.  Cardiovascular:     Rate and Rhythm: Normal rate and regular rhythm.     Pulses: Normal pulses.     Heart sounds: Normal heart sounds. No murmur heard. Pulmonary:     Effort: Pulmonary effort is normal. No respiratory distress.     Breath  sounds: Normal breath sounds. No wheezing, rhonchi or rales.  Musculoskeletal:     Cervical back: Normal range of motion and neck supple.     Right lower leg: No edema.     Left lower leg: No edema.  Skin:    General: Skin is warm and dry.     Findings: No rash.  Neurological:     Mental Status: She is alert.  Psychiatric:        Mood and Affect: Mood normal.        Behavior: Behavior normal.      Results for orders placed or performed in visit on 09/12/20  Microalbumin / creatinine urine ratio  Result Value Ref Range   Microalb, Ur 13.8 (H) 0.0 - 1.9 mg/dL   Creatinine,U 204.6 mg/dL   Microalb Creat Ratio 6.7 0.0 - 30.0 mg/g  VITAMIN D 25 Hydroxy (Vit-D Deficiency,  Fractures)  Result Value Ref Range   VITD 22.51 (L) 30.00 - 100.00 ng/mL  Comprehensive metabolic panel  Result Value Ref Range   Sodium 140 135 - 145 mEq/L   Potassium 3.9 3.5 - 5.1 mEq/L   Chloride 107 96 - 112 mEq/L   CO2 24 19 - 32 mEq/L   Glucose, Bld 91 70 - 99 mg/dL   BUN 18 6 - 23 mg/dL   Creatinine, Ser 0.66 0.40 - 1.20 mg/dL   Total Bilirubin 0.5 0.2 - 1.2 mg/dL   Alkaline Phosphatase 96 39 - 117 U/L   AST 18 0 - 37 U/L   ALT 23 0 - 35 U/L   Total Protein 7.3 6.0 - 8.3 g/dL   Albumin 4.1 3.5 - 5.2 g/dL   GFR 113.56 >60.00 mL/min   Calcium 9.4 8.4 - 10.5 mg/dL  Lipid panel  Result Value Ref Range   Cholesterol 183 0 - 200 mg/dL   Triglycerides 79.0 0.0 - 149.0 mg/dL   HDL 52.60 >39.00 mg/dL   VLDL 15.8 0.0 - 40.0 mg/dL   LDL Cholesterol 114 (H) 0 - 99 mg/dL   Total CHOL/HDL Ratio 3    NonHDL 130.05    Lab Results  Component Value Date   HGBA1C 5.3 08/17/2017    Assessment & Plan:  This visit occurred during the SARS-CoV-2 public health emergency.  Safety protocols were in place, including screening questions prior to the visit, additional usage of staff PPE, and extensive cleaning of exam room while observing appropriate contact time as indicated for disinfecting solutions.   Problem List Items Addressed This Visit     Morbid obesity with BMI of 50.0-59.9, adult ([place]) - Primary    24 hour food recall performed.  Reviewed recent diets and medications tried to date.  Recommend restart weight watchers now she's no longer breast feeding.  Recommend pricing out/trial of saxenda, reviewed mechanism of action, side effects and adverse effects to monitor for. No fmhx thyroid cancer.  RTC 4-6 wks f/u visit.       Relevant Medications   Liraglutide -Weight Management (SAXENDA) 18 MG/3ML SOPN   Seafood allergy    Epi pen refilled.      Other Visit Diagnoses     Need for influenza vaccination       Relevant Orders   Flu Vaccine QUAD 27mo+IM (Fluarix, Fluzone &  Alfiuria Quad PF) (Completed)        Meds ordered this encounter  Medications   EPINEPHrine 0.3 mg/0.3 mL IJ SOAJ injection    Sig: Inject 0.3 mg into the muscle as  needed for anaphylaxis.    Dispense:  2 each    Refill:  1   Liraglutide -Weight Management (SAXENDA) 18 MG/3ML SOPN    Sig: Inject 0.6 mg into the skin daily for 7 days, THEN 1.2 mg daily for 7 days, THEN 1.8 mg daily for 7 days, THEN 2.4 mg daily for 7 days.    Dispense:  15 mL    Refill:  0   Orders Placed This Encounter  Procedures   Flu Vaccine QUAD 63mo+IM (Fluarix, Fluzone & Alfiuria Quad PF)     Patient Instructions  Work on those diet sodas and sweet tea - choose water/water alternatives.  Restart weight watchers.  Trial saxenda vs wegovy - I will send saxenda starting course to pharmacy. If you develop nausea or side effects as you taper, drop to previously well tolerated dose and let me know.  Return in 4-6 weeks for follow up visit.   Follow up plan: Return in about 4 weeks (around 05/31/2021) for follow up visit.  [name], MD

## 2021-05-03 NOTE — Patient Instructions (Addendum)
Work on those diet sodas and sweet tea - choose water/water alternatives.  Restart weight watchers.  Trial saxenda vs wegovy - I will send saxenda starting course to pharmacy. If you develop nausea or side effects as you taper, drop to previously well tolerated dose and let me know.  Return in 4-6 weeks for follow up visit.

## 2021-05-03 NOTE — Assessment & Plan Note (Signed)
24 hour food recall performed.  Reviewed recent diets and medications tried to date.  Recommend restart weight watchers now she's no longer breast feeding.  Recommend pricing out/trial of saxenda, reviewed mechanism of action, side effects and adverse effects to monitor for. No fmhx thyroid cancer.  RTC 4-6 wks f/u visit.

## 2021-05-03 NOTE — Assessment & Plan Note (Signed)
Epipen refilled

## 2021-05-07 ENCOUNTER — Telehealth: Payer: Self-pay

## 2021-05-07 NOTE — Telephone Encounter (Signed)
Faxed form.  Decision pending.  

## 2021-05-07 NOTE — Telephone Encounter (Signed)
Received faxed PA form from [place] for Saxenda 18 mg/3 ml SOPN.  Placed form in Dr. Synthia Innocent box.

## 2021-05-07 NOTE — Telephone Encounter (Signed)
Filled and in Lisa's box 

## 2021-05-10 NOTE — Telephone Encounter (Signed)
Received faxed PA form requesting additional info.  Placed form in Dr. Synthia Innocent box.

## 2021-05-14 DIAGNOSIS — Z20822 Contact with and (suspected) exposure to covid-19: Secondary | ICD-10-CM | POA: Diagnosis not present

## 2021-05-14 NOTE — Telephone Encounter (Signed)
Paperwork was faxed and stamped today.

## 2021-05-14 NOTE — Telephone Encounter (Signed)
Filled and in LIsa's box.  

## 2021-05-22 ENCOUNTER — Encounter: Payer: Self-pay | Admitting: Family Medicine

## 2021-05-23 NOTE — Telephone Encounter (Signed)
Noted.  Looks like OV c/x via MyChart.

## 2021-05-23 NOTE — Telephone Encounter (Signed)
Plz cancel next week's appt

## 2021-05-28 ENCOUNTER — Ambulatory Visit: Payer: 59 | Admitting: Family Medicine

## 2021-06-06 ENCOUNTER — Other Ambulatory Visit: Payer: Self-pay

## 2021-06-06 MED ORDER — LEVONORGEST-ETH ESTRAD 91-DAY 0.15-0.03 MG PO TABS
ORAL_TABLET | ORAL | 0 refills | Status: DC
  Filled 2021-06-06: qty 91, 90d supply, fill #0

## 2021-07-01 DIAGNOSIS — Z23 Encounter for immunization: Secondary | ICD-10-CM | POA: Diagnosis not present

## 2021-07-07 ENCOUNTER — Encounter: Payer: Self-pay | Admitting: Family Medicine

## 2021-07-18 DIAGNOSIS — Z20822 Contact with and (suspected) exposure to covid-19: Secondary | ICD-10-CM | POA: Diagnosis not present

## 2021-07-31 DIAGNOSIS — H52222 Regular astigmatism, left eye: Secondary | ICD-10-CM | POA: Diagnosis not present

## 2021-07-31 DIAGNOSIS — H5203 Hypermetropia, bilateral: Secondary | ICD-10-CM | POA: Diagnosis not present

## 2021-08-05 ENCOUNTER — Other Ambulatory Visit: Payer: Self-pay

## 2021-08-05 MED ORDER — NYSTATIN 100000 UNIT/GM EX OINT
TOPICAL_OINTMENT | CUTANEOUS | 1 refills | Status: DC
  Filled 2021-08-05: qty 30, 15d supply, fill #0

## 2021-08-05 MED ORDER — TRIAMCINOLONE ACETONIDE 0.1 % EX OINT
TOPICAL_OINTMENT | CUTANEOUS | 1 refills | Status: DC
  Filled 2021-08-05: qty 15, 7d supply, fill #0

## 2021-08-07 DIAGNOSIS — N762 Acute vulvitis: Secondary | ICD-10-CM | POA: Diagnosis not present

## 2021-08-07 DIAGNOSIS — N938 Other specified abnormal uterine and vaginal bleeding: Secondary | ICD-10-CM | POA: Diagnosis not present

## 2021-08-14 ENCOUNTER — Other Ambulatory Visit: Payer: Self-pay

## 2021-08-14 MED ORDER — NORETHINDRONE ACETATE 5 MG PO TABS
ORAL_TABLET | ORAL | 0 refills | Status: DC
  Filled 2021-08-14: qty 30, 6d supply, fill #0

## 2021-08-22 ENCOUNTER — Other Ambulatory Visit: Payer: Self-pay

## 2021-08-22 MED ORDER — NORETHINDRONE ACETATE 5 MG PO TABS
ORAL_TABLET | ORAL | 0 refills | Status: DC
  Filled 2021-08-22: qty 30, 10d supply, fill #0

## 2021-08-26 ENCOUNTER — Other Ambulatory Visit: Payer: Self-pay

## 2021-08-26 DIAGNOSIS — Z113 Encounter for screening for infections with a predominantly sexual mode of transmission: Secondary | ICD-10-CM | POA: Diagnosis not present

## 2021-08-26 DIAGNOSIS — Z01419 Encounter for gynecological examination (general) (routine) without abnormal findings: Secondary | ICD-10-CM | POA: Diagnosis not present

## 2021-08-26 DIAGNOSIS — Z01411 Encounter for gynecological examination (general) (routine) with abnormal findings: Secondary | ICD-10-CM | POA: Diagnosis not present

## 2021-08-26 DIAGNOSIS — Z124 Encounter for screening for malignant neoplasm of cervix: Secondary | ICD-10-CM | POA: Diagnosis not present

## 2021-08-26 DIAGNOSIS — Z6841 Body Mass Index (BMI) 40.0 and over, adult: Secondary | ICD-10-CM | POA: Diagnosis not present

## 2021-08-26 DIAGNOSIS — N939 Abnormal uterine and vaginal bleeding, unspecified: Secondary | ICD-10-CM | POA: Diagnosis not present

## 2021-08-26 MED ORDER — CARESTART COVID-19 HOME TEST VI KIT
PACK | 0 refills | Status: DC
  Filled 2021-08-26: qty 2, 4d supply, fill #0

## 2021-08-26 MED ORDER — LEVONORGEST-ETH ESTRAD 91-DAY 0.15-0.03 MG PO TABS
ORAL_TABLET | ORAL | 0 refills | Status: DC
  Filled 2021-08-26: qty 91, 90d supply, fill #0

## 2021-09-05 DIAGNOSIS — N9489 Other specified conditions associated with female genital organs and menstrual cycle: Secondary | ICD-10-CM | POA: Insufficient documentation

## 2021-09-05 DIAGNOSIS — N938 Other specified abnormal uterine and vaginal bleeding: Secondary | ICD-10-CM | POA: Insufficient documentation

## 2021-09-05 DIAGNOSIS — N939 Abnormal uterine and vaginal bleeding, unspecified: Secondary | ICD-10-CM | POA: Diagnosis not present

## 2021-09-06 ENCOUNTER — Emergency Department (HOSPITAL_COMMUNITY)
Admission: EM | Admit: 2021-09-06 | Discharge: 2021-09-06 | Disposition: A | Discharge status: Home / Self Care | Payer: 59 | Attending: Emergency Medicine | Admitting: Emergency Medicine

## 2021-09-06 DIAGNOSIS — N938 Other specified abnormal uterine and vaginal bleeding: Secondary | ICD-10-CM | POA: Diagnosis not present

## 2021-09-06 DIAGNOSIS — N9489 Other specified conditions associated with female genital organs and menstrual cycle: Secondary | ICD-10-CM | POA: Diagnosis not present

## 2021-09-06 DIAGNOSIS — N939 Abnormal uterine and vaginal bleeding, unspecified: Secondary | ICD-10-CM

## 2021-09-06 LAB — CBC WITH DIFFERENTIAL/PLATELET
Abs Immature Granulocytes: 0.04 10*3/uL (ref 0.00–0.07)
Basophils Absolute: 0.1 10*3/uL (ref 0.0–0.1)
Basophils Relative: 1 %
Eosinophils Absolute: 0.2 10*3/uL (ref 0.0–0.5)
Eosinophils Relative: 2 %
HCT: 38 % (ref 36.0–46.0)
Hemoglobin: 12.6 g/dL (ref 12.0–15.0)
Immature Granulocytes: 0 %
Lymphocytes Relative: 29 %
Lymphs Abs: 3 10*3/uL (ref 0.7–4.0)
MCH: 30.8 pg (ref 26.0–34.0)
MCHC: 33.2 g/dL (ref 30.0–36.0)
MCV: 92.9 fL (ref 80.0–100.0)
Monocytes Absolute: 0.6 10*3/uL (ref 0.1–1.0)
Monocytes Relative: 6 %
Neutro Abs: 6.3 10*3/uL (ref 1.7–7.7)
Neutrophils Relative %: 62 %
Platelets: 272 10*3/uL (ref 150–400)
RBC: 4.09 MIL/uL (ref 3.87–5.11)
RDW: 13.7 % (ref 11.5–15.5)
WBC: 10.2 10*3/uL (ref 4.0–10.5)
nRBC: 0 % (ref 0.0–0.2)

## 2021-09-06 LAB — BASIC METABOLIC PANEL
Anion gap: 8 (ref 5–15)
BUN: 19 mg/dL (ref 6–20)
CO2: 21 mmol/L — ABNORMAL LOW (ref 22–32)
Calcium: 9.1 mg/dL (ref 8.9–10.3)
Chloride: 107 mmol/L (ref 98–111)
Creatinine, Ser: 0.75 mg/dL (ref 0.44–1.00)
GFR, Estimated: >60 mL/min (ref >60)
Glucose, Bld: 103 mg/dL — ABNORMAL HIGH (ref 70–99)
Potassium: 3.8 mmol/L (ref 3.5–5.1)
Sodium: 136 mmol/L (ref 135–145)

## 2021-09-06 LAB — I-STAT BETA HCG BLOOD, ED (MC, WL, AP ONLY): I-stat hCG, quantitative: <5 m[IU]/mL (ref <5)

## 2021-09-06 LAB — SAMPLE TO BLOOD BANK

## 2021-09-06 MED ORDER — NORETHINDRONE ACETATE 5 MG PO TABS: ORAL_TABLET | ORAL | 0 refills | Status: DC

## 2021-09-06 NOTE — ED Triage Notes (Signed)
Pt c/o "heavy" vaginal bleeding. Bled for 26 days, stopped for 10 days, began again Sunday after finishing aygestin. States it's progressively gotten heavier, "felt like water breaking" tonight around 2215. Passing clots half the size of her palm, soaking approx 2 pads/hr. Called OB, advised to come to ED for fear of hemorrhage. On BC, started Sunday.

## 2021-09-06 NOTE — ED Provider Triage Note (Signed)
Emergency Medicine Provider Triage Evaluation Note  Alisha Wood , a 36 y.o. female  was evaluated in triage.  Pt complains of DUB. Had onset of vaginal bleeding in January; first menses since delivering her son in September. Had vaginal bleeding x 26 days which stopped after starting Aygestin. Transitioned to birth control on Sunday at which time bleeding resumed. Became acutely worse at 2200 tonight while sitting on the cough. Passing large clots, requiring 2 pads/hr x 2 hours. Notes SOB, DOE, lightheadedness. No syncope, fevers, chest pain. Followed by Lynnell Chad.  Review of Systems  Positive: As above Negative: As above  Physical Exam  BP (!) 175/82 (BP Location: Right Arm)    Pulse 68    Temp 98.6 F (37 C) (Oral)    Resp (!) 22    SpO2 99%  Gen:   Awake, no distress   Resp:  Normal effort  MSK:   Moves extremities without difficulty  Other:  Nontoxic appearing and in NAD  Medical Decision Making  Medically screening exam initiated at 12:50 AM.  Appropriate orders placed.  Alisha Wood was informed that the remainder of the evaluation will be completed by another provider, this initial triage assessment does not replace that evaluation, and the importance of remaining in the ED until their evaluation is complete.  Dysfunctional uterine bleeding - concern for symptomatic anemia. Labs pending.   Antonietta Breach, PA-C 09/06/21 [phone]

## 2021-09-06 NOTE — ED Provider Notes (Signed)
[facility] EMERGENCY DEPARTMENT Provider Note  CSN: [id number] Arrival date & time: 09/05/21 2356  Chief Complaint(s) Vaginal Bleeding  HPI Alisha Wood is a 36 y.o. female with a past medical history listed below recently treated for menometrorrhagia with Aygestin who has tapered off and started her regular OCPs returns today after recurrence of her heavy bleeding.  Patient also reports feeling generally fatigued with dyspnea on exertion.  No nausea or vomiting.  No significant abdominal pain.  No vaginal discharge.  No urinary symptoms.  No other physical complaints.  Patient is not anticoagulated.   Vaginal Bleeding  Past Medical History Past Medical History:  Diagnosis Date   Angio-edema    Back pain    Fatty liver    per pt has resolved   Heartburn    Leg fracture 1995   right   Migraines    common and optic, controlled with alleve   NAFLD (nonalcoholic fatty liver disease) 03/2014   mild by [place], normal viral hep panel, iron levels, TSH   PONV (postoperative nausea and vomiting)    Postpartum care following cesarean delivery (7/19) 02/06/2015   Pregnancy induced hypertension    Severe obesity (BMI >= 40) ([place]) 08/30/2012   Saw nutritionist 06/2016   Urticaria    graduation from high school. stress induced urticaria(only time)   Patient Active Problem List   Diagnosis Date Noted   Seafood allergy 05/03/2021   Status post repeat low transverse cesarean section 9/4 03/24/2020   Vitamin D deficiency 06/22/2017   Chronic hypertension affecting pregnancy 02/08/2015   Morbid obesity with BMI of 50.0-59.9, adult ([place]) 08/30/2012   Health maintenance examination 06/19/2011   Home Medication(s) Prior to Admission medications   Medication Sig Start Date End Date Taking? Authorizing Provider  EPINEPHrine 0.3 mg/0.3 mL IJ SOAJ injection Inject 0.3 mg into the muscle as needed for anaphylaxis. 05/03/21  Yes [name], MD  levonorgestrel-ethinyl  estradiol (JOLESSA) 0.15-0.03 MG tablet Take 1 tablet every day by oral route. 08/26/21  Yes   COVID-19 At Home Antigen Test [facility] COVID-19 HOME TEST) KIT Use as directed 08/26/21   Letta Median, RPH  norethindrone (AYGESTIN) 5 MG tablet Take one tablet three times daily until bleeding stops. Once bleeding stops, take one tablet twice daily for the 7 days then one tablet daily until otherwise instructed. 09/06/21   Indira Sorenson, Grayce Sessions, MD  nystatin ointment (MYCOSTATIN) APPLY TO THE AFFECTED AREA(S) 2-4 TIMES PER DAY Patient not taking: Reported on 09/06/2021 08/05/21     triamcinolone ointment (KENALOG) 0.1 % APPLY A THIN LAYER TO THE AFFECTED AREA(S) 2-4 TIMES PER DAY Patient not taking: Reported on 09/06/2021 08/05/21                                                                                                                                       Allergies Other, Wellbutrin [bupropion], Keflex [cephalexin],  Metformin and related, and Sulfa antibiotics  Review of Systems Review of Systems  Genitourinary:  Positive for vaginal bleeding.  As noted in HPI  Physical Exam Vital Signs  I have reviewed the triage vital signs BP 136/77    Pulse (!) 101    Temp 98.6 F (37 C) (Oral)    Resp 16    SpO2 98%   Physical Exam Vitals reviewed.  Constitutional:      General: She is not in acute distress.    Appearance: She is well-developed. She is obese. She is not diaphoretic.  HENT:     Head: Normocephalic and atraumatic.     Right Ear: External ear normal.     Left Ear: External ear normal.     Nose: Nose normal.  Eyes:     General: No scleral icterus.    Conjunctiva/sclera: Conjunctivae normal.  Neck:     Trachea: Phonation normal.  Cardiovascular:     Rate and Rhythm: Normal rate and regular rhythm.  Pulmonary:     Effort: Pulmonary effort is normal. No respiratory distress.     Breath sounds: No stridor.  Abdominal:     General: There is no distension.     Tenderness: There  is no abdominal tenderness.  Musculoskeletal:        General: Normal range of motion.     Cervical back: Normal range of motion.  Skin:    General: Skin is warm.     Coloration: Skin is not pale.  Neurological:     Mental Status: She is alert and oriented to person, place, and time.  Psychiatric:        Behavior: Behavior normal.    ED Results and Treatments Labs (all labs ordered are listed, but only abnormal results are displayed) Labs Reviewed  BASIC METABOLIC PANEL - Abnormal; Notable for the following components:      Result Value   CO2 21 (*)    Glucose, Bld 103 (*)    All other components within normal limits  CBC WITH DIFFERENTIAL/PLATELET  I-STAT BETA HCG BLOOD, ED ([facility], [facility], [facility] ONLY)  SAMPLE TO BLOOD BANK                                                                                                                         EKG  EKG Interpretation  Date/Time:    Ventricular Rate:    PR Interval:    QRS Duration:   QT Interval:    QTC Calculation:   R Axis:     Text Interpretation:         Radiology No results found.  Pertinent labs & imaging results that were available during my care of the patient were reviewed by me and considered in my medical decision making (see MDM for details).  Medications Ordered in ED Medications - No data to display  Procedures Procedures  (including critical care time)  Medical Decision Making / ED Course        Vaginal bleeding Likely secondary to dysfunctional uterine bleeding.  Will need to determine whether patient is pregnant to rule out miscarriage.  Will obtain labs to assess for any anemia.  Work-up ordered to assess concerns above.  Labs independently interpreted by me and noted below: Beta-hCG negative CBC without anemia  Management: Monitored  Reassessment: Hemodynamically  stable  Consulted OB/GYN and spoke with Dr. Lanny Cramp.  She agreed on starting patient back on her Aygestin taper and recommended she follow-up in clinic.    Final Clinical Impression(s) / ED Diagnoses Final diagnoses:  Abnormal uterine bleeding (AUB)   The patient appears reasonably screened and/or stabilized for discharge and I doubt any other medical condition or other [facility] requiring further screening, evaluation, or treatment in the ED at this time prior to discharge. Safe for discharge with strict return precautions.  Disposition: Discharge  Condition: Good  I have discussed the results, Dx and Tx plan with the patient/family who expressed understanding and agree(s) with the plan. Discharge instructions discussed at length. The patient/family was given strict return precautions who verbalized understanding of the instructions. No further questions at time of discharge.    ED Discharge Orders          Ordered    norethindrone (AYGESTIN) 5 MG tablet        09/06/21 8590            Follow Up: Brien Few, MD [place] [address] [phone]  Call  to schedule an appointment for close follow up           This chart was dictated using voice recognition software.  Despite best efforts to proofread,  errors can occur which can change the documentation meaning.    Fatima Blank, MD 09/06/21 [phone]

## 2021-09-09 ENCOUNTER — Other Ambulatory Visit: Payer: Self-pay

## 2021-09-09 MED ORDER — NORETHINDRONE ACETATE 5 MG PO TABS
ORAL_TABLET | ORAL | 0 refills | Status: DC
  Filled 2021-09-09: qty 30, 10d supply, fill #0

## 2021-09-16 ENCOUNTER — Other Ambulatory Visit: Payer: Self-pay

## 2021-09-16 DIAGNOSIS — N939 Abnormal uterine and vaginal bleeding, unspecified: Secondary | ICD-10-CM | POA: Diagnosis not present

## 2021-09-16 MED ORDER — NORETHINDRONE 0.35 MG PO TABS
ORAL_TABLET | ORAL | 3 refills | Status: DC
  Filled 2021-09-16: qty 84, 84d supply, fill #0
  Filled 2021-12-03: qty 84, 84d supply, fill #1
  Filled 2022-02-26: qty 84, 84d supply, fill #2
  Filled 2022-04-28: qty 84, 84d supply, fill #3

## 2021-10-15 ENCOUNTER — Encounter: Payer: Self-pay | Admitting: Family Medicine

## 2021-12-04 ENCOUNTER — Other Ambulatory Visit: Payer: Self-pay

## 2022-01-01 ENCOUNTER — Encounter: Payer: Self-pay | Admitting: Emergency Medicine

## 2022-01-01 ENCOUNTER — Ambulatory Visit
Admission: EM | Admit: 2022-01-01 | Discharge: 2022-01-01 | Disposition: A | Discharge status: Home / Self Care | Payer: 59 | Attending: Emergency Medicine | Admitting: Emergency Medicine

## 2022-01-01 ENCOUNTER — Other Ambulatory Visit: Payer: Self-pay

## 2022-01-01 DIAGNOSIS — L03221 Cellulitis of neck: Secondary | ICD-10-CM

## 2022-01-01 MED ORDER — DOXYCYCLINE HYCLATE 100 MG PO TABS
100.0000 mg | ORAL_TABLET | Freq: Two times a day (BID) | ORAL | 0 refills | Status: DC
  Filled 2022-01-01: qty 14, 7d supply, fill #0

## 2022-01-01 NOTE — ED Triage Notes (Signed)
Pt presents with a pimple to the left side of her neck. She has some redness and pain since yesterday.

## 2022-01-01 NOTE — ED Provider Notes (Signed)
[facility] Provider Note  Patient Contact: 12:08 PM (approximate)   History   Pimple    HPI  Alisha Wood is a 36 y.o. female who presents to the urgent care for complaint of an erythematous slightly edematous lesion to the left neck that is sore.  Patient states that initially she thought it was a bug bite but it does not itch, it is sore to the touch.  It is worsening in regards to size, erythema.  It is in the skin, with no palpable lesion under the skin.  No sore throat.  No other complaints at this time.     Physical Exam   Triage Vital Signs: ED Triage Vitals  Enc Vitals Group     BP      Pulse      Resp      Temp      Temp src      SpO2      Weight      Height      Head Circumference      Peak Flow      Pain Score      Pain Loc      Pain Edu?      Excl. in [place]?     Most recent vital signs: Vitals:   01/01/22 1210  BP: (!) 141/99  Pulse: 86  Resp: 18  Temp: 98.6 F (37 C)  SpO2: 96%     General: Alert and in no acute distress.  Neck: No stridor. No cervical spine tenderness to palpation.  Visualization of the left anterior neck reveals an erythematous and edematous area that is slightly tender to palpation.  It is firm with no fluctuance or induration.  There is no purulent drainage.  No concern for abscess.  Area appears to be area of slight cellulitis  Cardiovascular:  Good peripheral perfusion Respiratory: Normal respiratory effort without tachypnea or retractions. Lungs CTAB.  Musculoskeletal: Full range of motion to all extremities.  Neurologic:  No gross focal neurologic deficits are appreciated.  Skin:   No rash noted Other:   ED Results / Procedures / Treatments   Labs (all labs ordered are listed, but only abnormal results are displayed) Labs Reviewed - No data to display   EKG     RADIOLOGY    No results found.  PROCEDURES:  Critical Care performed: No  Procedures   MEDICATIONS ORDERED  IN ED: Medications - No data to display   IMPRESSION / MDM / [place] / ED COURSE  I reviewed the triage vital signs and the nursing notes.                              Differential diagnosis includes, but is not limited to, insect bite, folliculitis, abscess, cellulitis  Patient's presentation is most consistent with acute illness / injury with system symptoms.   Patient's diagnosis is consistent with cellulitis of the neck.  Patient presents to the urgent care with complaint of erythematous and edematous lesion to the left neck.  There is a small 1 cm in diameter erythematous lesion that is tender to palpation.  Area does appear to be mild cellulitis.  No evidence of abscess requiring incision and drainage.  No indication for labs or imaging.  Patient will be treated with doxycycline as she is allergic to Keflex and sulfa antibiotics..  Concerning signs and symptoms are discussed with the  patient.  Otherwise follow-up primary care as needed.  Patient is given ED precautions to return to the ED for any worsening or new symptoms.        FINAL CLINICAL IMPRESSION(S) / ED DIAGNOSES   Final diagnoses:  Cellulitis of neck     Rx / DC Orders   ED Discharge Orders          Ordered    doxycycline (VIBRA-TABS) 100 MG tablet  2 times daily        01/01/22 1225             Note:  This document was prepared using Dragon voice recognition software and may include unintentional dictation errors.   Darletta Moll, PA-C 01/01/22 1225

## 2022-01-02 ENCOUNTER — Ambulatory Visit: Payer: 59 | Admitting: Family Medicine

## 2022-01-29 ENCOUNTER — Other Ambulatory Visit: Payer: Self-pay

## 2022-01-29 MED ORDER — NYSTATIN 100000 UNIT/GM EX OINT
TOPICAL_OINTMENT | CUTANEOUS | 1 refills | Status: DC
  Filled 2022-01-29: qty 15, 15d supply, fill #0
  Filled 2022-12-14 – 2022-12-16 (×2): qty 15, 15d supply, fill #1

## 2022-01-30 ENCOUNTER — Other Ambulatory Visit: Payer: Self-pay

## 2022-02-26 ENCOUNTER — Other Ambulatory Visit: Payer: Self-pay

## 2022-04-28 ENCOUNTER — Other Ambulatory Visit: Payer: Self-pay

## 2022-05-08 ENCOUNTER — Other Ambulatory Visit: Payer: Self-pay

## 2022-05-13 ENCOUNTER — Ambulatory Visit (INDEPENDENT_AMBULATORY_CARE_PROVIDER_SITE_OTHER): Payer: 59

## 2022-05-13 DIAGNOSIS — Z23 Encounter for immunization: Secondary | ICD-10-CM

## 2022-05-26 ENCOUNTER — Other Ambulatory Visit: Payer: Self-pay

## 2022-05-26 ENCOUNTER — Telehealth: Payer: 59 | Admitting: Family Medicine

## 2022-05-26 DIAGNOSIS — R253 Fasciculation: Secondary | ICD-10-CM

## 2022-05-26 DIAGNOSIS — J0100 Acute maxillary sinusitis, unspecified: Secondary | ICD-10-CM

## 2022-05-26 MED ORDER — DOXYCYCLINE HYCLATE 100 MG PO TABS
100.0000 mg | ORAL_TABLET | Freq: Two times a day (BID) | ORAL | 0 refills | Status: AC
  Filled 2022-05-26: qty 20, 10d supply, fill #0

## 2022-05-26 NOTE — Progress Notes (Signed)
E-Visit for Sinus Problems  We are sorry that you are not feeling well.  Here is how we plan to help!  Your symptoms are similar to a possible unilateral sinus infection, however if you start the medication and are not feeling better or worsen in next 1-2 days please be seen immediately in person.  Based on what you have shared with me it looks like you have sinusitis.  Sinusitis is inflammation and infection in the sinus cavities of the head.  Based on your presentation I believe you most likely have Acute Bacterial Sinusitis.  This is an infection caused by bacteria and is treated with antibiotics. I have prescribed Doxycycline '100mg'$  by mouth twice a day for 10 days. You may use an oral decongestant such as Mucinex D or if you have glaucoma or high blood pressure use plain Mucinex. Saline nasal spray help and can safely be used as often as needed for congestion.  If you develop worsening sinus pain, fever or notice severe headache and vision changes, or if symptoms are not better after completion of antibiotic, please schedule an appointment with a health care provider.    Sinus infections are not as easily transmitted as other respiratory infection, however we still recommend that you avoid close contact with loved ones, especially the very young and elderly.  Remember to wash your hands thoroughly throughout the day as this is the number one way to prevent the spread of infection!  Home Care: Only take medications as instructed by your medical team. Complete the entire course of an antibiotic. Do not take these medications with alcohol. A steam or ultrasonic humidifier can help congestion.  You can place a towel over your head and breathe in the steam from hot water coming from a faucet. Avoid close contacts especially the very young and the elderly. Cover your mouth when you cough or sneeze. Always remember to wash your hands.  Get Help Right Away If: You develop worsening fever or sinus  pain. You develop a severe head ache or visual changes. Your symptoms persist after you have completed your treatment plan.  Make sure you Understand these instructions. Will watch your condition. Will get help right away if you are not doing well or get worse.  Thank you for choosing an e-visit.  Your e-visit answers were reviewed by a board certified advanced clinical practitioner to complete your personal care plan. Depending upon the condition, your plan could have included both over the counter or prescription medications.  Please review your pharmacy choice. Make sure the pharmacy is open so you can pick up prescription now. If there is a problem, you may contact your provider through [employer] and have the prescription routed to another pharmacy.  Your safety is important to [place]. If you have drug allergies check your prescription carefully.   For the next 24 hours you can use MyChart to ask questions about today's visit, request a non-urgent call back, or ask for a work or school excuse. You will get an email in the next two days asking about your experience. I hope that your e-visit has been valuable and will speed your recovery.  I provided 5 minutes of non face-to-face time during this encounter for chart review, medication and order placement, as well as and documentation.

## 2022-05-27 ENCOUNTER — Ambulatory Visit: Payer: 59 | Admitting: Family Medicine

## 2022-06-21 ENCOUNTER — Encounter: Payer: Self-pay | Admitting: Family Medicine

## 2022-06-22 ENCOUNTER — Other Ambulatory Visit: Payer: Self-pay

## 2022-06-22 ENCOUNTER — Other Ambulatory Visit: Payer: Self-pay | Admitting: Family Medicine

## 2022-06-23 ENCOUNTER — Other Ambulatory Visit: Payer: Self-pay

## 2022-06-23 NOTE — Telephone Encounter (Signed)
EpiPen Last filled:  05/03/21, #2 ea Last OV:  05/03/21, diarrhea, fatigue, dizziness Next OV:  none

## 2022-06-24 ENCOUNTER — Other Ambulatory Visit: Payer: Self-pay

## 2022-06-24 ENCOUNTER — Encounter: Payer: Self-pay | Admitting: Family Medicine

## 2022-06-24 MED FILL — Epinephrine Solution Auto-injector 0.3 MG/0.3ML (1:1000): INTRAMUSCULAR | 30 days supply | Qty: 2 | Fill #0 | Status: CN

## 2022-06-25 ENCOUNTER — Other Ambulatory Visit: Payer: Self-pay

## 2022-06-25 ENCOUNTER — Other Ambulatory Visit: Payer: Self-pay | Admitting: Family Medicine

## 2022-06-25 NOTE — Telephone Encounter (Signed)
Duplicate request.  (See 06/22/22 refill note.)

## 2022-06-25 NOTE — Telephone Encounter (Signed)
Updated pt's chart.  

## 2022-06-26 ENCOUNTER — Ambulatory Visit
Admission: EM | Admit: 2022-06-26 | Discharge: 2022-06-26 | Disposition: A | Discharge status: Home / Self Care | Payer: 59 | Attending: Emergency Medicine | Admitting: Emergency Medicine

## 2022-06-26 DIAGNOSIS — B349 Viral infection, unspecified: Secondary | ICD-10-CM

## 2022-06-26 DIAGNOSIS — Z20822 Contact with and (suspected) exposure to covid-19: Secondary | ICD-10-CM

## 2022-06-26 LAB — RESP PANEL BY RT-PCR (FLU A&B, COVID) ARPGX2
Influenza A by PCR: NEGATIVE
Influenza B by PCR: NEGATIVE
SARS Coronavirus 2 by RT PCR: POSITIVE — AB

## 2022-06-26 NOTE — ED Triage Notes (Signed)
Patient to Urgent Care with complaints of cough, fevers/ chills (max temp 100), post nasal drip, and body aches. Symptoms started this morning.   Has not taken any otc medications.   Reports mother tested positive for covid yesterday. Patient did a home covid test this morning that was negative.

## 2022-06-26 NOTE — Discharge Instructions (Addendum)
Your COVID and Flu tests are pending.    Take Tylenol or ibuprofen as needed for fever or discomfort.  Rest and keep yourself hydrated.    Follow-up with your primary care provider if your symptoms are not improving.
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
## 2022-06-27 ENCOUNTER — Other Ambulatory Visit: Payer: Self-pay

## 2022-06-27 ENCOUNTER — Encounter: Payer: Self-pay | Admitting: Family Medicine

## 2022-06-27 MED ORDER — MOLNUPIRAVIR EUA 200MG CAPSULE: 4.0000 | ORAL_CAPSULE | Freq: Two times a day (BID) | ORAL | 0 refills | Status: AC

## 2022-06-27 NOTE — Telephone Encounter (Signed)
Please see the MyChart message reply(ies) for my assessment and plan.  The patient gave consent for this Medical Advice Message and is aware that it may result in a bill to their insurance company as well as the possibility that this may result in a co-payment or deductible. They are an established patient, but are not seeking medical advice exclusively about a problem treated during an in person or video visit in the last 7 days. I did not recommend an in person or video visit within 7 days of my reply.  I spent a total of 7 minutes cumulative time within 7 days through [place] messaging [name], MD

## 2022-06-27 NOTE — Telephone Encounter (Signed)
Seen on 06/26/22 at [facility].

## 2022-06-27 NOTE — Addendum Note (Signed)
Addended by: [name] on: 06/27/2022 05:55 PM   Modules accepted: Orders

## 2022-06-27 NOTE — Telephone Encounter (Signed)
Spoke with patient. Too late to send to [facility]> will send to local [facility].

## 2022-07-01 ENCOUNTER — Other Ambulatory Visit: Payer: Self-pay

## 2022-07-16 ENCOUNTER — Other Ambulatory Visit: Payer: Self-pay

## 2022-07-16 MED FILL — Epinephrine Solution Auto-injector 0.3 MG/0.3ML (1:1000): INTRAMUSCULAR | 30 days supply | Qty: 2 | Fill #0 | Status: AC

## 2022-08-01 ENCOUNTER — Other Ambulatory Visit: Payer: Self-pay

## 2022-08-01 MED ORDER — NORETHINDRONE 0.35 MG PO TABS
1.0000 | ORAL_TABLET | Freq: Every day | ORAL | 0 refills | Status: DC
  Filled 2022-08-01: qty 84, 84d supply, fill #0

## 2022-08-14 DIAGNOSIS — H52222 Regular astigmatism, left eye: Secondary | ICD-10-CM | POA: Diagnosis not present

## 2022-08-14 DIAGNOSIS — H5203 Hypermetropia, bilateral: Secondary | ICD-10-CM | POA: Diagnosis not present

## 2022-09-11 ENCOUNTER — Other Ambulatory Visit: Payer: Self-pay

## 2022-09-11 DIAGNOSIS — Z01419 Encounter for gynecological examination (general) (routine) without abnormal findings: Secondary | ICD-10-CM | POA: Diagnosis not present

## 2022-09-11 DIAGNOSIS — Z124 Encounter for screening for malignant neoplasm of cervix: Secondary | ICD-10-CM | POA: Diagnosis not present

## 2022-09-11 DIAGNOSIS — Z113 Encounter for screening for infections with a predominantly sexual mode of transmission: Secondary | ICD-10-CM | POA: Diagnosis not present

## 2022-09-11 DIAGNOSIS — N939 Abnormal uterine and vaginal bleeding, unspecified: Secondary | ICD-10-CM | POA: Diagnosis not present

## 2022-09-11 DIAGNOSIS — Z3041 Encounter for surveillance of contraceptive pills: Secondary | ICD-10-CM | POA: Diagnosis not present

## 2022-09-11 DIAGNOSIS — Z01411 Encounter for gynecological examination (general) (routine) with abnormal findings: Secondary | ICD-10-CM | POA: Diagnosis not present

## 2022-09-11 DIAGNOSIS — Z6841 Body Mass Index (BMI) 40.0 and over, adult: Secondary | ICD-10-CM | POA: Diagnosis not present

## 2022-09-11 MED ORDER — NORETHINDRONE 0.35 MG PO TABS
1.0000 | ORAL_TABLET | Freq: Every day | ORAL | 3 refills | Status: DC
  Filled 2022-09-11 – 2022-11-10 (×2): qty 84, 84d supply, fill #0
  Filled 2023-02-03: qty 84, 84d supply, fill #1

## 2022-09-16 LAB — HM PAP SMEAR

## 2022-11-10 ENCOUNTER — Other Ambulatory Visit: Payer: Self-pay

## 2022-12-15 ENCOUNTER — Other Ambulatory Visit: Payer: Self-pay

## 2022-12-16 ENCOUNTER — Other Ambulatory Visit: Payer: Self-pay

## 2023-02-03 ENCOUNTER — Other Ambulatory Visit: Payer: Self-pay

## 2023-02-13 DIAGNOSIS — H15112 Episcleritis periodica fugax, left eye: Secondary | ICD-10-CM | POA: Diagnosis not present

## 2023-02-16 DIAGNOSIS — H20012 Primary iridocyclitis, left eye: Secondary | ICD-10-CM | POA: Diagnosis not present

## 2023-02-18 DIAGNOSIS — H20013 Primary iridocyclitis, bilateral: Secondary | ICD-10-CM | POA: Diagnosis not present

## 2023-02-23 ENCOUNTER — Encounter: Payer: Self-pay | Admitting: Family Medicine

## 2023-02-23 ENCOUNTER — Ambulatory Visit (INDEPENDENT_AMBULATORY_CARE_PROVIDER_SITE_OTHER)
Admission: RE | Admit: 2023-02-23 | Discharge: 2023-02-23 | Disposition: A | Discharge status: Home / Self Care | Payer: Commercial Managed Care - PPO | Source: Ambulatory Visit | Attending: Family Medicine | Admitting: Family Medicine

## 2023-02-23 ENCOUNTER — Ambulatory Visit: Payer: Commercial Managed Care - PPO | Admitting: Family Medicine

## 2023-02-23 VITALS — BP 126/80 | HR 90 | Temp 97.3°F | Ht 62.0 in | Wt 267.4 lb

## 2023-02-23 DIAGNOSIS — N63 Unspecified lump in unspecified breast: Secondary | ICD-10-CM | POA: Diagnosis not present

## 2023-02-23 DIAGNOSIS — H20023 Recurrent acute iridocyclitis, bilateral: Secondary | ICD-10-CM | POA: Insufficient documentation

## 2023-02-23 DIAGNOSIS — H15103 Unspecified episcleritis, bilateral: Secondary | ICD-10-CM | POA: Diagnosis not present

## 2023-02-23 DIAGNOSIS — H15099 Other scleritis, unspecified eye: Secondary | ICD-10-CM | POA: Diagnosis not present

## 2023-02-23 LAB — POC URINALSYSI DIPSTICK (AUTOMATED)
Bilirubin, UA: NEGATIVE
Blood, UA: NEGATIVE
Glucose, UA: NEGATIVE
Ketones, UA: NEGATIVE
Leukocytes, UA: NEGATIVE
Nitrite, UA: NEGATIVE
Protein, UA: NEGATIVE
Spec Grav, UA: >=1.03 — AB (ref 1.010–1.025)
Urobilinogen, UA: 0.2 E.U./dL
pH, UA: 5.5 (ref 5.0–8.0)

## 2023-02-23 NOTE — Patient Instructions (Addendum)
Labs today  Urinalysis today  Chest xray today  We will be in touch with results.

## 2023-02-23 NOTE — Progress Notes (Signed)
Ph: [phone] Fax: [phone]   Patient ID: Alisha Wood, female    DOB: Apr 26, 1986, 37 y.o.   MRN: [id number]  This visit was conducted in person.  BP 126/80   Pulse 90   Temp (!) 97.3 F (36.3 C) (Temporal)   Ht 5\' 2"  (1.575 m)   Wt 267 lb 6 oz (121.3 kg)   LMP  (Within Months)   SpO2 98%   BMI 48.90 kg/m   Vision Screening   Right eye Left eye Both eyes  Without correction     With correction 20/25 20/20 20/20      CC: bilateral eye pain and redness Subjective:   HPI: Alisha Wood is a 37 y.o. female presenting on 02/23/2023 for Eye Problem (C/o B eye inflammation and redness, worse in R eye. Started 02/11/23. Seen by eye doc- 02/18/23. Suggested pt see PCP to discuss testing for autoimmune issues. )   1 wk h/o L eye irritation and scratching, red eyes, swelling.  Saw [facility] - told episcleritis, treated with prednisolone eye drops QID and indomethacin oral - trouble tolerating this so transitioned to ibuprofen.  Initial improvement in eye redness, acute worsening of eye pain  Sent to [facility] in [place] (Dr Valente David) - increased prednisolone to Q2 hours.  Was recommended autoimmune evaluation given bilateral eyes affected.   New reddish purple area to left breast noted on Friday, nontender, not itchy, doesn't think had bug bite.   No vision changes throughout all of this, no fevers/chills. No new joint pains, other new rashes, oral lesions, new fatigue, bowel changes, blood in stool, abd pain.  No recent tick bites.  No dyspnea or cough.  Chronic intermittent loose stools since cholecystectomy 2012.   Maternal aunt with MS Paternal great aunt with lupus.      Relevant past medical, surgical, family and social history reviewed and updated as indicated. Interim medical history since our last visit reviewed. Allergies and medications reviewed and updated. Outpatient Medications Prior to Visit  Medication Sig Dispense Refill    EPINEPHrine 0.3 mg/0.3 mL IJ SOAJ injection Inject 0.3 mg into the muscle as needed for anaphylaxis. 2 each 1   norethindrone (INCASSIA) 0.35 MG tablet Take 1 tablet by mouth daily.     prednisoLONE acetate (PRED FORTE) 1 % ophthalmic suspension PLEASE SEE ATTACHED FOR DETAILED DIRECTIONS     COVID-19 At Home Antigen Test ([facility] COVID-19 HOME TEST) KIT Use as directed 2 kit 0   levonorgestrel-ethinyl estradiol (JOLESSA) 0.15-0.03 MG tablet Take 1 tablet every day by oral route. 91 tablet 0   norethindrone (AYGESTIN) 5 MG tablet Take one tablet three times daily until bleeding stops. Once bleeding stops, take one tablet twice daily for the 7 days then one tablet daily until otherwise instructed. 30 tablet 0   norethindrone (AYGESTIN) 5 MG tablet Take one tablet by mouth 3 times a day until bleeding stops. Once bleeding stops, take 1 tab twice daily for 7 days, then 1 tab daily until otherwise instructed 30 tablet 0   norethindrone (CAMILA) 0.35 MG tablet Take 1 tablet (0.35 mg total) by mouth daily. 84 tablet 3   nystatin ointment (MYCOSTATIN) APPLY TO THE AFFECTED AREA(S) 2-4 TIMES PER DAY (Patient not taking: Reported on 09/06/2021) 15 g 1   nystatin ointment (MYCOSTATIN) APPLY TO THE AFFECTED AREA(S) BY TOPICAL ROUTE 2-4 TIMES PER DAY 15 g 1   triamcinolone ointment (KENALOG) 0.1 % APPLY A THIN LAYER TO THE AFFECTED AREA(S)  2-4 TIMES PER DAY (Patient not taking: Reported on 09/06/2021) 15 g 1   No facility-administered medications prior to visit.     Per HPI unless specifically indicated in ROS section below Review of Systems  Objective:  BP 126/80   Pulse 90   Temp (!) 97.3 F (36.3 C) (Temporal)   Ht 5\' 2"  (1.575 m)   Wt 267 lb 6 oz (121.3 kg)   LMP  (Within Months)   SpO2 98%   BMI 48.90 kg/m   Wt Readings from Last 3 Encounters:  02/23/23 267 lb 6 oz (121.3 kg)  06/26/22 261 lb (118.4 kg)  05/03/21 276 lb 5 oz (125.3 kg)      Physical Exam Vitals and nursing note reviewed.   Constitutional:      Appearance: Normal appearance. She is not ill-appearing.  HENT:     Mouth/Throat:     Mouth: Mucous membranes are moist.     Pharynx: Oropharynx is clear. No oropharyngeal exudate or posterior oropharyngeal erythema.  Eyes:     General: No scleral icterus.       Right eye: No discharge.        Left eye: No discharge.     Extraocular Movements: Extraocular movements intact.     Conjunctiva/sclera: Conjunctivae normal.     Pupils: Pupils are equal, round, and reactive to light.  Cardiovascular:     Rate and Rhythm: Normal rate and regular rhythm.     Pulses: Normal pulses.     Heart sounds: Normal heart sounds. No murmur heard. Pulmonary:     Effort: Pulmonary effort is normal. No respiratory distress.     Breath sounds: Normal breath sounds. No wheezing, rhonchi or rales.  Chest:       Comments: Small erythematous nodule on skin of breast medial to nipple Musculoskeletal:     Right lower leg: No edema.     Left lower leg: No edema.  Skin:    General: Skin is warm and dry.     Capillary Refill: Capillary refill takes less than 2 seconds.     Findings: No rash.  Neurological:     General: No focal deficit present.     Mental Status: She is alert.  Psychiatric:        Mood and Affect: Mood normal.        Behavior: Behavior normal.       Results for orders placed or performed in visit on 02/23/23  POCT Urinalysis Dipstick (Automated)  Result Value Ref Range   Color, UA yellow    Clarity, UA clear    Glucose, UA Negative Negative   Bilirubin, UA negative    Ketones, UA negative    Spec Grav, UA >=1.030 (A) 1.010 - 1.025   Blood, UA negative    pH, UA 5.5 5.0 - 8.0   Protein, UA Negative Negative   Urobilinogen, UA 0.2 0.2 or 1.0 E.U./dL   Nitrite, UA negative    Leukocytes, UA Negative Negative    Lab Results  Component Value Date   TSH 1.159 03/25/2019   Lab Results  Component Value Date   WBC 10.2 09/06/2021   HGB 12.6 09/06/2021    HCT 38.0 09/06/2021   MCV 92.9 09/06/2021   PLT 272 09/06/2021    Lab Results  Component Value Date   NA 136 09/06/2021   CL 107 09/06/2021   K 3.8 09/06/2021   CO2 21 (L) 09/06/2021   BUN 19 09/06/2021   CREATININE  0.75 09/06/2021   GFRNONAA >60 09/06/2021   CALCIUM 9.1 09/06/2021   PHOS 4.1 02/11/2015   ALBUMIN 4.1 09/12/2020   GLUCOSE 103 (H) 09/06/2021    Lab Results  Component Value Date   ALT 23 09/12/2020   AST 18 09/12/2020   ALKPHOS 96 09/12/2020   BILITOT 0.5 09/12/2020  No results found for: "ESRSEDRATE", "POCTSEDRATE" No results found for: "CRP"  No results found for: "ANA"    Assessment & Plan:   Problem List Items Addressed This Visit     Nodule of skin of breast    Rec warm compresses.  If not improved in 1-2 wks, update [place] to consider imaging.       Episcleritis of both eyes - Primary    Initially left eye, now right eye as well.  Closely followed by ophthalmology. Appreciate their care.  Will further evaluate for systemic cause although she doesn't have other symptoms. Check CXR, UA, autoimmune labs      Relevant Orders   Comprehensive metabolic panel   CBC with Differential/Platelet   Sedimentation rate   ANA   C-reactive protein   DG Chest 2 View   POCT Urinalysis Dipstick (Automated) (Completed)     No orders of the defined types were placed in this encounter.   Orders Placed This Encounter  Procedures   DG Chest 2 View    Standing Status:   Future    Number of Occurrences:   1    Standing Expiration Date:   02/23/2024    Order Specific Question:   Reason for Exam (SYMPTOM  OR DIAGNOSIS REQUIRED)    Answer:   recurrent episcleritis    Order Specific Question:   Is patient pregnant?    Answer:   No    Order Specific Question:   Preferred imaging location?    Answer:   [facility]   Comprehensive metabolic panel   CBC with Differential/Platelet   Sedimentation rate   ANA   C-reactive protein   POCT Urinalysis Dipstick  (Automated)    Patient Instructions  Labs today  Urinalysis today  Chest xray today  We will be in touch with results.   Follow up plan: Return if symptoms worsen or fail to improve.  Eustaquio Boyden, MD

## 2023-02-23 NOTE — Assessment & Plan Note (Signed)
Rec warm compresses.  If not improved in 1-2 wks, update [place] to consider imaging.

## 2023-02-23 NOTE — Assessment & Plan Note (Addendum)
Initially left eye, now right eye as well.  Closely followed by ophthalmology. Appreciate their care.  Will further evaluate for systemic cause although she doesn't have other symptoms. Check CXR, UA, autoimmune labs

## 2023-02-27 ENCOUNTER — Encounter: Payer: Self-pay | Admitting: Family Medicine

## 2023-02-27 DIAGNOSIS — H15103 Unspecified episcleritis, bilateral: Secondary | ICD-10-CM

## 2023-02-27 DIAGNOSIS — R768 Other specified abnormal immunological findings in serum: Secondary | ICD-10-CM

## 2023-03-02 DIAGNOSIS — H20013 Primary iridocyclitis, bilateral: Secondary | ICD-10-CM | POA: Diagnosis not present

## 2023-03-10 DIAGNOSIS — R768 Other specified abnormal immunological findings in serum: Secondary | ICD-10-CM | POA: Insufficient documentation

## 2023-03-10 NOTE — Addendum Note (Signed)
Addended by: Eustaquio Boyden on: 03/10/2023 01:35 PM   Modules accepted: Orders
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
## 2023-04-13 ENCOUNTER — Telehealth: Payer: Commercial Managed Care - PPO | Admitting: Family Medicine

## 2023-04-13 DIAGNOSIS — B9689 Other specified bacterial agents as the cause of diseases classified elsewhere: Secondary | ICD-10-CM

## 2023-04-13 DIAGNOSIS — J019 Acute sinusitis, unspecified: Secondary | ICD-10-CM

## 2023-04-13 MED ORDER — DOXYCYCLINE HYCLATE 100 MG PO TABS: 100.0000 mg | ORAL_TABLET | Freq: Two times a day (BID) | ORAL | 0 refills | Status: AC

## 2023-04-13 NOTE — Progress Notes (Signed)

## 2023-04-14 ENCOUNTER — Ambulatory Visit (INDEPENDENT_AMBULATORY_CARE_PROVIDER_SITE_OTHER): Payer: Commercial Managed Care - PPO

## 2023-04-14 ENCOUNTER — Encounter: Payer: Self-pay | Admitting: Rheumatology

## 2023-04-14 ENCOUNTER — Ambulatory Visit: Payer: Commercial Managed Care - PPO

## 2023-04-14 ENCOUNTER — Telehealth: Payer: Self-pay | Admitting: Rheumatology

## 2023-04-14 ENCOUNTER — Ambulatory Visit: Payer: Commercial Managed Care - PPO | Attending: Rheumatology | Admitting: Rheumatology

## 2023-04-14 VITALS — BP 148/88 | HR 96 | Resp 17 | Ht 63.0 in | Wt 275.2 lb

## 2023-04-14 DIAGNOSIS — M79641 Pain in right hand: Secondary | ICD-10-CM | POA: Diagnosis not present

## 2023-04-14 DIAGNOSIS — Z79899 Other long term (current) drug therapy: Secondary | ICD-10-CM | POA: Diagnosis not present

## 2023-04-14 DIAGNOSIS — Z6841 Body Mass Index (BMI) 40.0 and over, adult: Secondary | ICD-10-CM | POA: Diagnosis not present

## 2023-04-14 DIAGNOSIS — R768 Other specified abnormal immunological findings in serum: Secondary | ICD-10-CM

## 2023-04-14 DIAGNOSIS — M79642 Pain in left hand: Secondary | ICD-10-CM

## 2023-04-14 DIAGNOSIS — Z820 Family history of epilepsy and other diseases of the nervous system: Secondary | ICD-10-CM

## 2023-04-14 DIAGNOSIS — E559 Vitamin D deficiency, unspecified: Secondary | ICD-10-CM | POA: Diagnosis not present

## 2023-04-14 DIAGNOSIS — Z8269 Family history of other diseases of the musculoskeletal system and connective tissue: Secondary | ICD-10-CM

## 2023-04-14 DIAGNOSIS — M2550 Pain in unspecified joint: Secondary | ICD-10-CM

## 2023-04-14 DIAGNOSIS — R030 Elevated blood-pressure reading, without diagnosis of hypertension: Secondary | ICD-10-CM

## 2023-04-14 DIAGNOSIS — G8929 Other chronic pain: Secondary | ICD-10-CM

## 2023-04-14 DIAGNOSIS — H15103 Unspecified episcleritis, bilateral: Secondary | ICD-10-CM

## 2023-04-14 DIAGNOSIS — Z91013 Allergy to seafood: Secondary | ICD-10-CM | POA: Diagnosis not present

## 2023-04-14 DIAGNOSIS — M545 Low back pain, unspecified: Secondary | ICD-10-CM
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
## 2023-04-20 ENCOUNTER — Encounter: Payer: Commercial Managed Care - PPO | Admitting: Rheumatology

## 2023-04-20 DIAGNOSIS — R768 Other specified abnormal immunological findings in serum: Secondary | ICD-10-CM

## 2023-04-20 DIAGNOSIS — N63 Unspecified lump in unspecified breast: Secondary | ICD-10-CM

## 2023-04-20 DIAGNOSIS — Z91013 Allergy to seafood: Secondary | ICD-10-CM

## 2023-04-20 DIAGNOSIS — H15103 Unspecified episcleritis, bilateral: Secondary | ICD-10-CM

## 2023-04-20 DIAGNOSIS — E559 Vitamin D deficiency, unspecified: Secondary | ICD-10-CM

## 2023-04-20 NOTE — Progress Notes (Signed)
ANA low titer positive, RNP positive, EBV shows past infection.  All other labs available so far are within normal limits.  I will discuss results at the follow-up visit.

## 2023-04-24 LAB — PAN-ANCA
ANCA SCREEN: NEGATIVE
Myeloperoxidase Abs: <1 AI (ref <1.0)
Serine Protease 3: <1 AI (ref <1.0)

## 2023-04-24 LAB — URINALYSIS, ROUTINE W REFLEX MICROSCOPIC
Bilirubin Urine: NEGATIVE
Glucose, UA: NEGATIVE
Hgb urine dipstick: NEGATIVE
Ketones, ur: NEGATIVE
Leukocytes,Ua: NEGATIVE
Nitrite: NEGATIVE
Protein, ur: NEGATIVE
Specific Gravity, Urine: 1.022 (ref 1.001–1.035)
pH: 5.5 (ref 5.0–8.0)

## 2023-04-24 LAB — PROTEIN ELECTROPHORESIS, SERUM
Albumin ELP: 4.7 g/dL (ref 3.8–4.8)
Alpha 1: 0.3 g/dL (ref 0.2–0.3)
Alpha 2: 0.8 g/dL (ref 0.5–0.9)
Beta 2: 0.3 g/dL (ref 0.2–0.5)
Beta Globulin: 0.6 g/dL (ref 0.4–0.6)
Gamma Globulin: 1.3 g/dL (ref 0.8–1.7)
Total Protein: 8 g/dL (ref 6.1–8.1)

## 2023-04-24 LAB — CYCLIC CITRUL PEPTIDE ANTIBODY, IGG: Cyclic Citrullin Peptide Ab: <16 U

## 2023-04-24 LAB — C3 AND C4
C3 Complement: 173 mg/dL (ref 83–193)
C4 Complement: 24 mg/dL (ref 15–57)

## 2023-04-24 LAB — ANTI-DNA ANTIBODY, DOUBLE-STRANDED: ds DNA Ab: <1 [IU]/mL

## 2023-04-24 LAB — QUANTIFERON-TB GOLD PLUS
Mitogen-NIL: >10 [IU]/mL
NIL: 0.04 [IU]/mL
QuantiFERON-TB Gold Plus: NEGATIVE
TB1-NIL: 0.01 [IU]/mL
TB2-NIL: 0.03 [IU]/mL

## 2023-04-24 LAB — SJOGRENS SYNDROME-A EXTRACTABLE NUCLEAR ANTIBODY: SSA (Ro) (ENA) Antibody, IgG: <1 AI

## 2023-04-24 LAB — ANTI-NUCLEAR AB-TITER (ANA TITER)
ANA TITER: 1:40 {titer} — ABNORMAL HIGH
ANA TITER: 1:40 {titer} — ABNORMAL HIGH
ANA Titer 1: 1:40 {titer} — ABNORMAL HIGH

## 2023-04-24 LAB — EPSTEIN-BARR VIRUS VCA ANTIBODY PANEL
EBV NA IgG: 457 U/mL — ABNORMAL HIGH
EBV VCA IgG: 343 U/mL — ABNORMAL HIGH
EBV VCA IgM: <36 U/mL

## 2023-04-24 LAB — TSH: TSH: 1.09 m[IU]/L

## 2023-04-24 LAB — HSV(HERPES SIMPLEX VRS) I + II AB-IGG
HAV 1 IGG,TYPE SPECIFIC AB: <0.9 {index}
HSV 2 IGG,TYPE SPECIFIC AB: <0.9 {index}

## 2023-04-24 LAB — TREPONEMA PALLIDUM ANTIBODY, IMMUNOASSAY: Treponema Pallidum AB, Immunoassay: NEGATIVE

## 2023-04-24 LAB — B. BURGDORFI ANTIBODIES: B burgdorferi Ab IgG+IgM: <0.9 {index}

## 2023-04-24 LAB — THIOPURINE METHYLTRANSFERASE (TPMT), RBC: Thiopurine Methyltransferase, RBC: 13 nmol/h/mL

## 2023-04-24 LAB — RNP ANTIBODY: Ribonucleic Protein(ENA) Antibody, IgG: 1.5 AI — AB

## 2023-04-24 LAB — HEPATITIS B CORE ANTIBODY, IGM: Hep B C IgM: NONREACTIVE

## 2023-04-24 LAB — CK: Total CK: 42 U/L (ref 29–143)

## 2023-04-24 LAB — CMV ABS, IGG+IGM (CYTOMEGALOVIRUS)
CMV IgM: <30 [AU]/ml
Cytomegalovirus Ab-IgG: <0.6 U/mL

## 2023-04-24 LAB — HLA-B27 ANTIGEN: HLA-B27 Antigen: NEGATIVE

## 2023-04-24 LAB — HEPATITIS C ANTIBODY: Hepatitis C Ab: NONREACTIVE

## 2023-04-24 LAB — VARICELLA ZOSTER IGG AND IGM
Varicella IgG: 9.78 {s_co_ratio}
Varicella Zoster Ab IgM: <=0.9 (ref <=0.90)

## 2023-04-24 LAB — SJOGRENS SYNDROME-B EXTRACTABLE NUCLEAR ANTIBODY: SSB (La) (ENA) Antibody, IgG: <1 AI

## 2023-04-24 LAB — ANA: Anti Nuclear Antibody (ANA): POSITIVE — AB

## 2023-04-24 LAB — ANTI-SMITH ANTIBODY: ENA SM Ab Ser-aCnc: <1 AI

## 2023-04-24 LAB — TOXOPLASMA GONDII ANTIBODY, IGM: Toxoplasma Antibody- IgM: <8 [AU]/ml

## 2023-04-24 LAB — RHEUMATOID FACTOR: Rheumatoid fact SerPl-aCnc: <10 [IU]/mL (ref <14)

## 2023-04-24 LAB — ANGIOTENSIN CONVERTING ENZYME: Angiotensin-Converting Enzyme: 16 U/L (ref 9–67)

## 2023-04-24 LAB — GLUCOSE 6 PHOSPHATE DEHYDROGENASE: G-6PDH: 18 U/g{Hb} (ref 7.0–20.5)
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
## 2023-04-27 ENCOUNTER — Other Ambulatory Visit: Payer: Self-pay

## 2023-04-27 ENCOUNTER — Other Ambulatory Visit: Payer: Self-pay | Admitting: Family Medicine

## 2023-04-27 MED ORDER — NORETHINDRONE 0.35 MG PO TABS
1.0000 | ORAL_TABLET | Freq: Every day | ORAL | 1 refills | Status: DC
  Filled 2023-04-27: qty 84, 84d supply, fill #0
  Filled 2023-07-08: qty 84, 84d supply, fill #1

## 2023-04-27 NOTE — Telephone Encounter (Signed)
Spoke with pt asking if this med is prescribed by OB/GYN. Pt confirms it is and she will contact them for the refill.   Request denied.

## 2023-05-01 ENCOUNTER — Encounter: Payer: Self-pay | Admitting: Family Medicine

## 2023-05-01 ENCOUNTER — Encounter: Payer: Self-pay | Admitting: Rheumatology

## 2023-05-01 ENCOUNTER — Ambulatory Visit: Payer: Commercial Managed Care - PPO | Attending: Rheumatology | Admitting: Rheumatology

## 2023-05-01 VITALS — BP 151/104 | HR 90 | Resp 14 | Ht 62.5 in | Wt 274.0 lb

## 2023-05-01 DIAGNOSIS — Z820 Family history of epilepsy and other diseases of the nervous system: Secondary | ICD-10-CM | POA: Diagnosis not present

## 2023-05-01 DIAGNOSIS — Z8269 Family history of other diseases of the musculoskeletal system and connective tissue: Secondary | ICD-10-CM

## 2023-05-01 DIAGNOSIS — H209 Unspecified iridocyclitis: Secondary | ICD-10-CM

## 2023-05-01 DIAGNOSIS — H15103 Unspecified episcleritis, bilateral: Secondary | ICD-10-CM

## 2023-05-01 DIAGNOSIS — M545 Low back pain, unspecified: Secondary | ICD-10-CM

## 2023-05-01 DIAGNOSIS — R768 Other specified abnormal immunological findings in serum: Secondary | ICD-10-CM | POA: Diagnosis not present

## 2023-05-01 DIAGNOSIS — M79641 Pain in right hand: Secondary | ICD-10-CM

## 2023-05-01 DIAGNOSIS — E559 Vitamin D deficiency, unspecified: Secondary | ICD-10-CM | POA: Diagnosis not present

## 2023-05-01 DIAGNOSIS — Z91013 Allergy to seafood: Secondary | ICD-10-CM | POA: Diagnosis not present

## 2023-05-01 DIAGNOSIS — M2550 Pain in unspecified joint: Secondary | ICD-10-CM | POA: Diagnosis not present

## 2023-05-01 DIAGNOSIS — M79642 Pain in left hand: Secondary | ICD-10-CM

## 2023-05-01 DIAGNOSIS — Z6841 Body Mass Index (BMI) 40.0 and over, adult: Secondary | ICD-10-CM

## 2023-05-01 DIAGNOSIS — G8929 Other chronic pain: Secondary | ICD-10-CM

## 2023-05-01 NOTE — Patient Instructions (Signed)
Methotrexate Tablets What is this medication? METHOTREXATE (METH oh TREX ate) treats autoimmune conditions, such as arthritis and psoriasis. It works by decreasing inflammation, which can reduce pain and prevent long-term injury to the joints and skin. It may also be used to treat some types of cancer. It works by slowing down the growth of cancer cells. This medicine may be used for other purposes; ask your health care provider or pharmacist if you have questions. COMMON BRAND NAME(S): Rheumatrex, Trexall What should I tell my care team before I take this medication? They need to know if you have any of these conditions: Dehydration Diabetes Fluid in the stomach area or lungs Frequently drink alcohol Having surgery, including dental surgery High cholesterol Immune system problems Inflammatory bowel disease, such as ulcerative colitis Kidney disease Liver disease Low blood cell levels (white cells, red cells, and platelets) Lung disease Recent or ongoing radiation Recent or upcoming vaccine Stomach ulcers, other stomach or intestine problems An unusual or allergic reaction to methotrexate, other medications, foods, dyes, or preservatives Pregnant or trying to get pregnant Breastfeeding How should I use this medication? Take this medication by mouth with water. Take it as directed on the prescription label. Do not take extra. Keep taking this medication until your care team tells you to stop. Know why you are taking this medication and how you should take it. To treat conditions such as arthritis and psoriasis, this medication is taken ONCE A WEEK as a single dose or divided into 3 smaller doses taken 12 hours apart (do not take more than 3 doses 12 hours apart each week). This medication is NEVER taken daily to treat conditions other than cancer. Taking this medication more often than directed can cause serious side effects, even death. Talk to your care team about why you are taking this  medication, how often you will take it, and what your dose is. Ask your care team to put the reason you take this medication on the prescription. If you take this medication ONCE A WEEK, choose a day of the week before you start. Ask your pharmacist to include the day of the week on the label. Avoid "Monday", which could be misread as "Morning". Handling this medication may be harmful. Talk to your care team about how to handle this medication. Special instructions may apply. Talk to your care team about the use of this medication in children. While it may be prescribed for selected conditions, precautions do apply. Overdosage: If you think you have taken too much of this medicine contact a poison control center or emergency room at once. NOTE: This medicine is only for you. Do not share this medicine with others. What if I miss a dose? If you miss a dose, talk with your care team. Do not take double or extra doses. What may interact with this medication? Do not take this medication with any of the following: Acitretin Live virus vaccines Probenecid This medication may also interact with the following: Alcohol Aspirin and aspirin-like medications Certain antibiotics, such as penicillin, neomycin, sulfamethoxazole; trimethoprim Certain medications for stomach problems, such as lansoprazole, omeprazole, pantoprazole Clozapine Cyclosporine Dapsone Folic acid Foscarnet NSAIDs, medications for pain and inflammation, such as ibuprofen or naproxen Phenytoin Pyrimethamine Steroid medications, such as prednisone or cortisone Tacrolimus Theophylline This list may not describe all possible interactions. Give your health care provider a list of all the medicines, herbs, non-prescription drugs, or dietary supplements you use. Also tell them if you smoke, drink alcohol, or use  illegal drugs. Some items may interact with your medicine. What should I watch for while using this medication? Visit your  care team for regular checks on your progress. It may be some time before you see the benefit from this medication. You may need blood work done while you are taking this medication. If your care team has also prescribed folic acid, they may instruct you to skip your folic acid dose on the day you take methotrexate. This medication can make you more sensitive to the sun. Keep out of the sun. If you cannot avoid being in the sun, wear protective clothing and sunscreen. Do not use sun lamps, tanning beds, or tanning booths. Check with your care team if you have severe diarrhea, nausea, and vomiting, or if you sweat a lot. The loss of too much body fluid may make it dangerous for you to take this medication. This medication may increase your risk of getting an infection. Call your care team for advice if you get a fever, chills, sore throat, or other symptoms of a cold or flu. Do not treat yourself. Try to avoid being around people who are sick. Talk to your care team about your risk of cancer. You may be more at risk for certain types of cancers if you take this medication. Talk to your care team if you or your partner may be pregnant. Serious birth defects can occur if you take this medication during pregnancy and for 6 months after the last dose. You will need a negative pregnancy test before starting this medication. Contraception is recommended while taking this medication and for 6 months after the last dose. Your care team can help you find the option that works for you. If your partner can get pregnant, use a condom during sex while taking this medication and for 3 months after the last dose. Do not breastfeed while taking this medication and for 1 week after the last dose. This medication may cause infertility. Talk to your care team if you are concerned about your fertility. What side effects may I notice from receiving this medication? Side effects that you should report to your care team as soon  as possible: Allergic reactions--skin rash, itching, hives, swelling of the face, lips, tongue, or throat Dry cough, shortness of breath or trouble breathing Infection--fever, chills, cough, sore throat, wounds that don't heal, pain or trouble when passing urine, general feeling of discomfort or being unwell Kidney injury--decrease in the amount of urine, swelling of the ankles, hands, or feet Liver injury--right upper belly pain, loss of appetite, nausea, light-colored stool, dark yellow or brown urine, yellowing skin or eyes, unusual weakness or fatigue Low red blood cell level--unusual weakness or fatigue, dizziness, headache, trouble breathing Pain, tingling, or numbness in the hands or feet, muscle weakness, change in vision, confusion or trouble speaking, loss of balance or coordination, trouble walking, seizures Redness, blistering, peeling, or loosening of the skin, including inside the mouth Stomach bleeding--bloody or black, tar-like stools, vomiting blood or brown material that looks like coffee grounds Stomach pain that is severe, does not away, or gets worse Unusual bruising or bleeding Side effects that usually do not require medical attention (report these to your care team if they continue or are bothersome): Diarrhea Dizziness Hair loss Nausea Pain, redness, or swelling with sores inside the mouth or throat Skin reactions on sun-exposed areas Vomiting This list may not describe all possible side effects. Call your doctor for medical advice about side effects. You  may report side effects to FDA at 1-800-FDA-1088. Where should I keep my medication? Keep out of the reach of children and pets. Store at room temperature between 20 and 25 degrees C (68 and 77 degrees F). Protect from light. Keep the container tightly closed. Get rid of any unused medication after the expiration date. To get rid of medications that are no longer needed or have expired: Take the medication to a  medication take-back program. Check with your pharmacy or law enforcement to find a location. If you cannot return the medication, ask your pharmacist or care team how to get rid of this medication safely. NOTE: This sheet is a summary. It may not cover all possible information. If you have questions about this medicine, talk to your doctor, pharmacist, or health care provider.  2024 Elsevier/Gold Standard (2022-07-29 00:00:00)

## 2023-05-04 ENCOUNTER — Ambulatory Visit (INDEPENDENT_AMBULATORY_CARE_PROVIDER_SITE_OTHER): Payer: Commercial Managed Care - PPO

## 2023-05-04 DIAGNOSIS — Z23 Encounter for immunization: Secondary | ICD-10-CM | POA: Diagnosis not present

## 2023-05-04 NOTE — Telephone Encounter (Signed)
You will need to see patent for office visit to do referral

## 2023-05-22 ENCOUNTER — Ambulatory Visit: Payer: Commercial Managed Care - PPO | Admitting: Rheumatology

## 2023-05-25 DIAGNOSIS — H04123 Dry eye syndrome of bilateral lacrimal glands: Secondary | ICD-10-CM | POA: Diagnosis not present

## 2023-05-25 DIAGNOSIS — H20013 Primary iridocyclitis, bilateral: Secondary | ICD-10-CM | POA: Diagnosis not present

## 2023-06-01 NOTE — Telephone Encounter (Signed)
error 

## 2023-06-01 NOTE — Addendum Note (Signed)
Addended by: Eustaquio Boyden on: 06/01/2023 08:03 AM   Modules accepted: Orders

## 2023-06-10 ENCOUNTER — Encounter: Payer: Commercial Managed Care - PPO | Admitting: Rheumatology

## 2023-06-17 NOTE — Telephone Encounter (Signed)
Pt did not schedule appt due to cost. See scanned note.

## 2023-06-19 ENCOUNTER — Encounter: Payer: Self-pay | Admitting: Emergency Medicine

## 2023-06-19 ENCOUNTER — Other Ambulatory Visit: Payer: Self-pay

## 2023-06-19 ENCOUNTER — Ambulatory Visit
Admission: EM | Admit: 2023-06-19 | Discharge: 2023-06-19 | Disposition: A | Discharge status: Home / Self Care | Payer: Commercial Managed Care - PPO

## 2023-06-19 DIAGNOSIS — R110 Nausea: Secondary | ICD-10-CM | POA: Diagnosis not present

## 2023-06-19 DIAGNOSIS — R197 Diarrhea, unspecified: Secondary | ICD-10-CM | POA: Diagnosis not present

## 2023-06-19 MED ORDER — ONDANSETRON 4 MG PO TBDP: 4.0000 mg | ORAL_TABLET | Freq: Three times a day (TID) | ORAL | 0 refills | Status: DC | PRN

## 2023-06-19 NOTE — ED Triage Notes (Addendum)
Nausea and diarrhea for 5 days.  No vomiting.  No one else is sick at home.  Takes birth control and prilosec regularly. Today has been to the toilet once with diarrhea.  Reports it has been 4-5 times a day.  Reports stool is dark greenish color and maybe mucus.  Patient affirms it is water stool

## 2023-06-19 NOTE — Discharge Instructions (Addendum)
Take the antinausea medication as directed.    Keep yourself hydrated with clear liquids, such as water.  Follow the diarrhea diet as tolerated.   Go to the emergency department if you have worsening symptoms.    Follow up with your primary care provider.
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
## 2023-07-08 ENCOUNTER — Other Ambulatory Visit: Payer: Self-pay

## 2023-07-08 ENCOUNTER — Ambulatory Visit (INDEPENDENT_AMBULATORY_CARE_PROVIDER_SITE_OTHER): Payer: Commercial Managed Care - PPO | Admitting: Family Medicine

## 2023-07-08 ENCOUNTER — Other Ambulatory Visit (HOSPITAL_COMMUNITY): Payer: Self-pay

## 2023-07-08 ENCOUNTER — Encounter: Payer: Self-pay | Admitting: Family Medicine

## 2023-07-08 VITALS — BP 124/84 | HR 88 | Temp 98.2°F | Ht 62.25 in | Wt 272.5 lb

## 2023-07-08 DIAGNOSIS — E559 Vitamin D deficiency, unspecified: Secondary | ICD-10-CM | POA: Diagnosis not present

## 2023-07-08 DIAGNOSIS — H20023 Recurrent acute iridocyclitis, bilateral: Secondary | ICD-10-CM | POA: Diagnosis not present

## 2023-07-08 DIAGNOSIS — Z0000 Encounter for general adult medical examination without abnormal findings: Secondary | ICD-10-CM | POA: Diagnosis not present

## 2023-07-08 LAB — LIPID PANEL
Cholesterol: 181 mg/dL (ref 0–200)
HDL: 44.7 mg/dL (ref >39.00)
LDL Cholesterol: 108 mg/dL — ABNORMAL HIGH (ref 0–99)
NonHDL: 136.4
Total CHOL/HDL Ratio: 4
Triglycerides: 144 mg/dL (ref 0.0–149.0)
VLDL: 28.8 mg/dL (ref 0.0–40.0)

## 2023-07-08 LAB — BASIC METABOLIC PANEL
BUN: 14 mg/dL (ref 6–23)
CO2: 25 meq/L (ref 19–32)
Calcium: 9.7 mg/dL (ref 8.4–10.5)
Chloride: 104 meq/L (ref 96–112)
Creatinine, Ser: 0.64 mg/dL (ref 0.40–1.20)
GFR: 112.16 mL/min (ref >60.00)
Glucose, Bld: 99 mg/dL (ref 70–99)
Potassium: 4 meq/L (ref 3.5–5.1)
Sodium: 137 meq/L (ref 135–145)

## 2023-07-08 LAB — VITAMIN D 25 HYDROXY (VIT D DEFICIENCY, FRACTURES): VITD: 16.21 ng/mL — ABNORMAL LOW (ref 30.00–100.00)

## 2023-07-08 LAB — HEMOGLOBIN A1C: Hgb A1c MFr Bld: 5.6 % (ref 4.6–6.5)

## 2023-07-08 NOTE — Assessment & Plan Note (Signed)
Preventative protocols reviewed and updated unless pt declined. Discussed healthy diet and lifestyle.  

## 2023-07-08 NOTE — Assessment & Plan Note (Signed)
Update levels off replacement. 

## 2023-07-08 NOTE — Patient Instructions (Addendum)
Labs today  Good to see you today Work on cutting down on diet sodas, increasing water.  Return in 1 year for next physical.

## 2023-07-08 NOTE — Assessment & Plan Note (Signed)
Appreciate ophthalmology and rheumatology care.

## 2023-07-08 NOTE — Assessment & Plan Note (Addendum)
Encouraged healthy diet and lifestyle choices to affect sustainable weight loss.  [facility] currently unaff[facility] - will reconsider in the new year.  3-4 cans of diet coke daily - rec cut down on this.  Check labs including A1c.

## 2023-07-08 NOTE — Progress Notes (Signed)
Ph: [phone] Fax: [phone]   Patient ID: Alisha Wood, female    DOB: 06-Dec-1985, 37 y.o.   MRN: [id number]  This visit was conducted in person.  BP 124/84   Pulse 88   Temp 98.2 F (36.8 C) (Oral)   Ht 5' 2.25" (1.581 m)   Wt 272 lb 8 oz (123.6 kg)   LMP  (Within Months)   SpO2 96%   BMI 49.44 kg/m    CC: CPE Subjective:   HPI: Alisha Wood is a 37 y.o. 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  Father awaiting kidney transplant [employer] for parents and aunt with MS.   Recent diagnosis recurrent bilateral episcleritis /iritis followed by ophthalmology Dr Candida Peeling at [facility] and saw rheumatologist Dr Corliss Skains - discussing subcutaneous methotrexate if recurrence. Advised to cut down on processed foods.   Seen at  Hospital last month for nausea/diarrhea - symptoms have resolved.   Obesity BMI 49 - referred to [facility] in [place], did not schedule appt due to cost. [facility] as children were not allowed at that office. Insurance doesn't cover injectables.   Preventative: Well woman exam yearly at [facility]) with always normal paps Flu shot yearly COVID vaccine Pfizer 03/2020, 04/2020, 06/2021, 06/2022, 05/2023 Tdap 2016  MMR completed (2 vaccines) Seat belt use discussed Sunscreen use discussed. No changing moles on skin.  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 Interim medical history since our last visit reviewed. Allergies and medications reviewed and  updated. Outpatient Medications Prior to Visit  Medication Sig Dispense Refill   EPINEPHrine 0.3 mg/0.3 mL IJ SOAJ injection Inject 0.3 mg into the muscle as needed for anaphylaxis. 2 each 1   norethindrone (CAMILA) 0.35 MG tablet Take 1 tablet (0.35 mg total) by mouth daily. 84 tablet 1   omeprazole (PRILOSEC OTC) 20 MG tablet Take 20 mg by mouth daily.     ondansetron (ZOFRAN-ODT) 4 MG disintegrating tablet Take 1 tablet (4 mg total) by mouth every 8 (eight) hours as needed for nausea or vomiting. 20 tablet 0   prednisoLONE acetate (PRED FORTE) 1 % ophthalmic suspension      norethindrone (INCASSIA) 0.35 MG tablet Take 1 tablet by mouth daily.     No facility-administered medications prior to visit.     Per HPI unless specifically indicated in ROS section below Review of Systems  Constitutional:  Negative for activity change, appetite change, chills, fatigue, fever and unexpected weight change.  HENT:  Negative for hearing loss.   Eyes:  Positive for redness. Negative for visual disturbance.  Respiratory:  Negative for cough, chest tightness, shortness of breath and wheezing.   Cardiovascular:  Negative for chest pain, palpitations and leg swelling.  Gastrointestinal:  Positive for diarrhea and nausea. Negative for abdominal distention, abdominal pain, blood in stool, constipation and vomiting.  Genitourinary:  Negative for difficulty urinating and hematuria.  Musculoskeletal:  Negative for arthralgias, myalgias and neck pain.  Skin:  Negative for rash.  Neurological:  Negative for dizziness, seizures, syncope and headaches.  Hematological:  Negative for adenopathy. Does not bruise/bleed easily.  Psychiatric/Behavioral:  Negative for dysphoric mood. The patient is not nervous/anxious.     Objective:  BP 124/84   Pulse 88   Temp 98.2 F (36.8 C) (Oral)   Ht 5' 2.25" (1.581 m)   Wt 272 lb 8 oz (123.6 kg)   LMP  (Within Months)   SpO2 96%   BMI 49.44 kg/m   Wt Readings from  Last 3 Encounters:  07/08/23 272 lb 8 oz (123.6 kg)  05/01/23 274 lb (124.3 kg)  04/14/23 275 lb 3.2 oz (124.8 kg)      Physical Exam Vitals and nursing note reviewed.  Constitutional:      Appearance: Normal appearance. She is not ill-appearing.  HENT:     Head: Normocephalic and atraumatic.     Right Ear: Tympanic membrane, ear canal and external ear normal. There is no impacted cerumen.     Left Ear: Tympanic membrane, ear canal and external ear normal. There is no impacted cerumen.     Mouth/Throat:     Mouth: Mucous membranes are moist.     Pharynx: Oropharynx is clear. No oropharyngeal exudate or posterior oropharyngeal erythema.  Eyes:     General:        Right eye: No discharge.        Left eye: No discharge.     Extraocular Movements: Extraocular movements intact.     Conjunctiva/sclera: Conjunctivae normal.     Pupils: Pupils are equal, round, and reactive to light.  Neck:     Thyroid: No thyroid mass or thyromegaly.  Cardiovascular:     Rate and Rhythm: Normal rate and regular rhythm.     Pulses: Normal pulses.     Heart sounds: Normal heart sounds. No murmur heard. Pulmonary:     Effort: Pulmonary effort is normal. No respiratory distress.     Breath sounds: Normal breath sounds. No wheezing, rhonchi or rales.  Abdominal:     General: Bowel sounds are normal. There is no distension.     Palpations: Abdomen is soft. There is no mass.     Tenderness: There is no abdominal tenderness. There is no guarding or rebound.     Hernia: No hernia is present.  Musculoskeletal:     Cervical back: Normal range of motion and neck supple. No rigidity.     Right lower leg: No edema.     Left lower leg: No edema.  Lymphadenopathy:     Cervical: No cervical adenopathy.  Skin:    General: Skin is warm and dry.     Findings: No rash.  Neurological:     General: No focal deficit present.     Mental Status: She is alert. Mental status is at baseline.  Psychiatric:        Mood  and Affect: Mood normal.        Behavior: Behavior normal.       Lab Results  Component Value Date   TSH 1.09 04/14/2023    Lab Results  Component Value Date   NA 136 02/23/2023   CL 103 02/23/2023   K 4.3 02/23/2023   CO2 18 (L) 02/23/2023   BUN 18 02/23/2023   CREATININE 0.73 02/23/2023   GFR 104.65 02/23/2023   CALCIUM 9.6 02/23/2023   PHOS 4.1 02/11/2015   ALBUMIN 4.7 02/23/2023   GLUCOSE 84 02/23/2023    Lab Results  Component Value Date  CHOL 183 09/12/2020   HDL 52.60 09/12/2020   LDLCALC 114 (H) 09/12/2020   TRIG 79.0 09/12/2020   CHOLHDL 3 09/12/2020    Lab Results  Component Value Date   VD25OH 22.51 (L) 09/12/2020   Lab Results  Component Value Date   HGBA1C 5.3 08/17/2017   Assessment & Plan:   Problem List Items Addressed This Visit     Health maintenance examination - Primary (Chronic)   Preventative protocols reviewed and updated unless pt declined. Discussed healthy diet and lifestyle.       Relevant Orders   Lipid panel   Basic metabolic panel   Obesity, morbid, BMI 40.0-49.9 (HCC)   Encouraged healthy diet and lifestyle choices to affect sustainable weight loss.  [facility] unaff[facility] - will reconsider in the new year.  3-4 cans of diet coke daily - rec cut down on this.  Check labs including A1c.       Relevant Orders   Hemoglobin A1c   Vitamin D deficiency   Update levels off replacement.       Relevant Orders   VITAMIN D 25 Hydroxy (Vit-D Deficiency, Fractures)   Recurrent iritis of both eyes   Appreciate ophthalmology and rheumatology care.         No orders of the defined types were placed in this encounter.   Orders Placed This Encounter  Procedures   Lipid panel   Basic metabolic panel   VITAMIN D 25 Hydroxy (Vit-D Deficiency, Fractures)   Hemoglobin A1c    Patient Instructions  Labs today  Good to see you today Work on cutting down on diet sodas, increasing water.  Return in 1 year  for next physical.   Follow up plan: Return in about 1 year (around 07/07/2024), or if symptoms worsen or fail to improve, for annual exam, prior fasting for blood work.  Eustaquio Boyden, MD

## 2023-07-10 ENCOUNTER — Ambulatory Visit: Payer: Commercial Managed Care - PPO | Admitting: Rheumatology

## 2023-07-16 ENCOUNTER — Other Ambulatory Visit: Payer: Self-pay | Admitting: Family Medicine

## 2023-07-16 ENCOUNTER — Encounter: Payer: Self-pay | Admitting: Family Medicine

## 2023-07-16 MED ORDER — VITAMIN D 50 MCG (2000 UT) PO CAPS: 1.0000 | ORAL_CAPSULE | Freq: Every day | ORAL | Status: DC

## 2023-07-20 ENCOUNTER — Other Ambulatory Visit: Payer: Self-pay

## 2023-07-20 MED ORDER — VITAMIN D3 1.25 MG (50000 UT) PO CAPS
1.0000 | ORAL_CAPSULE | ORAL | 3 refills | Status: DC
  Filled 2023-07-20: qty 12, 84d supply, fill #0
  Filled 2023-10-08: qty 12, 84d supply, fill #1
  Filled 2023-12-29: qty 12, 84d supply, fill #2
  Filled 2024-03-20: qty 12, 84d supply, fill #3

## 2023-07-20 MED ORDER — CHOLECALCIFEROL 1.25 MG (50000 UT) PO TABS
1.0000 | ORAL_TABLET | ORAL | 3 refills | Status: DC
  Filled 2023-07-20: qty 12, fill #0

## 2023-07-20 NOTE — Addendum Note (Signed)
Addended by: Eustaquio Boyden on: 07/20/2023 08:33 AM   Modules accepted: Orders
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
## 2023-08-11 DIAGNOSIS — H02882 Meibomian gland dysfunction right lower eyelid: Secondary | ICD-10-CM | POA: Diagnosis not present

## 2023-08-11 DIAGNOSIS — H02885 Meibomian gland dysfunction left lower eyelid: Secondary | ICD-10-CM | POA: Diagnosis not present

## 2023-08-11 DIAGNOSIS — H5203 Hypermetropia, bilateral: Secondary | ICD-10-CM | POA: Diagnosis not present

## 2023-08-13 ENCOUNTER — Ambulatory Visit: Payer: Commercial Managed Care - PPO | Admitting: Rheumatology

## 2023-08-13 DIAGNOSIS — M2550 Pain in unspecified joint: Secondary | ICD-10-CM

## 2023-08-13 DIAGNOSIS — Z8269 Family history of other diseases of the musculoskeletal system and connective tissue: Secondary | ICD-10-CM

## 2023-08-13 DIAGNOSIS — H209 Unspecified iridocyclitis: Secondary | ICD-10-CM

## 2023-08-13 DIAGNOSIS — Z6841 Body Mass Index (BMI) 40.0 and over, adult: Secondary | ICD-10-CM

## 2023-08-13 DIAGNOSIS — M79641 Pain in right hand: Secondary | ICD-10-CM

## 2023-08-13 DIAGNOSIS — Z820 Family history of epilepsy and other diseases of the nervous system: Secondary | ICD-10-CM

## 2023-08-13 DIAGNOSIS — Z91013 Allergy to seafood: Secondary | ICD-10-CM

## 2023-08-13 DIAGNOSIS — G8929 Other chronic pain: Secondary | ICD-10-CM

## 2023-08-13 DIAGNOSIS — R768 Other specified abnormal immunological findings in serum: Secondary | ICD-10-CM

## 2023-08-13 DIAGNOSIS — E559 Vitamin D deficiency, unspecified: Secondary | ICD-10-CM

## 2023-08-17 ENCOUNTER — Telehealth: Payer: Self-pay | Admitting: Family Medicine

## 2023-08-17 NOTE — Telephone Encounter (Signed)
FYI: This call has been transferred to Access Nurse. Once the result note has been entered staff can address the message at that time.  Patient called in with the following symptoms:  Red Word:dizziness    Please advise at Mobile [phone] (mobile)  Message is routed to Provider Pool and [facility] Triage    Pt scheduled appt via mychart for next Mon, 2/3 with Dr. Reece Agar for heart palpitations & feeling light headed. Spoke to pt, pt states her issues began about 3 weeks ago. Pt declined triage. Suggested moving appt sooner than next week, pt agreed, r/s pt's appt to Wed, 1/29 @ 2pm with Dr. Reece Agar. Sending note to triage pool & [place] pool. Call back # [phone]

## 2023-08-17 NOTE — Telephone Encounter (Signed)
Noted. Will see then.

## 2023-08-17 NOTE — Telephone Encounter (Signed)
I spoke with pt ; starting about 3 wks ago with no noted reason pt started with heart palpitaitins, light headedness, and SOB for few brief seconds at each episode. Pt said happen x 2 for few seconds each time today and that is what prompted pt to call since occurred x 2 today. Pt not having any CP, H/A or vision changes. Pt is not on any heart or BP meds. Pts BP has been 120/80 and resting heart rate 60s; when has episode per pts smart watch pulse is 60 - 80. Pt has cut back on caffeine; pt now drinks 1 - 1 1/2 twelve oz) diet drinks per day. I advised if could maybe try for no caffeine and drink more water until sees Dr Reece Agar on 08/19/23 at 2 PM. Pt will be at [facility] 1:45 for ck in., UC & ED precautions given and pt voiced understanding. 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 Sending note to Dr Reece Agar.

## 2023-08-19 ENCOUNTER — Ambulatory Visit: Payer: Commercial Managed Care - PPO | Admitting: Family Medicine

## 2023-08-19 ENCOUNTER — Encounter: Payer: Self-pay | Admitting: Family Medicine

## 2023-08-19 VITALS — BP 126/86 | HR 88 | Temp 98.7°F | Ht 62.25 in | Wt 273.4 lb

## 2023-08-19 DIAGNOSIS — R002 Palpitations: Secondary | ICD-10-CM

## 2023-08-19 NOTE — Assessment & Plan Note (Addendum)
Initial episode was more severe, associated with tachycardia to 120s lasting 1 minute, but more recently episodes are minimally symptomatic lasting only a few seconds at a time, not associated with fast heart beat.  She has recently cut down on caffeine intake and increased water intake and may have already noted benefit.  Reviewed recent labs - relatively recent normal TSH, CBC.  Suspect symptomatic cardiac ectopy. Discussed PRN metoprolol however doubt would be beneficial given short duration of episodes.  Recommend continue to monitor at this time, she will let me know if recurrence despite above caffeine/hydration changes and I will order Zio patch.  EKG reassuring today.

## 2023-08-19 NOTE — Patient Instructions (Addendum)
May take pepcid 20mg  at night as needed.  I agree with limiting caffeine, increasing water.  Let me know if recurrent symptoms to order 2 wk heart monitor (Zio patch).

## 2023-08-19 NOTE — Progress Notes (Signed)
Ph: [phone] Fax: [phone]   Patient ID: Alisha Wood, female    DOB: Oct 08, 1985, 38 y.o.   MRN: [id number]  This visit was conducted in person.  BP 126/86   Pulse 88   Temp 98.7 F (37.1 C) (Oral)   Ht 5' 2.25" (1.581 m)   Wt 273 lb 6 oz (124 kg)   LMP 08/05/2023   SpO2 98%   BMI 49.60 kg/m   Orthostatic Vitals for the past 48 hrs (Last 6 readings):  Orthostatic BP BP Pulse  08/19/23 1351 -- 126/86 88  08/19/23 1358 140/90 -- --  08/19/23 1402 132/86 -- --  Not orthostatic.  CC: palpitations Subjective:   HPI: Alisha Wood is a 38 y.o. female presenting on 08/19/2023 for Palpitations (C/o palpitations and feeling light headed. Sxs started about 3 wks ago. )   3 wk h/o heart palpitations associated with lightheadedness, dyspnea lasting seconds. Initial episode was more severe and lasted about 1 minute, pulse up to 122 but subsequently 70-80s during episodes. Feels "hard" heart beats.   No associated chest pain, tightness, headache, vision changes BP has been stable.  Mother with h/o afib.  Paternal aunt with hypothyroidism.  She notes she stays cold intolerant - longterm.  2 wks ago started row machine.   Caffeine: drinks 12-16 oz caffeinated beverage daily - significant decrease from prior.  She's cut out caffeine in the past 2 days, drinking more water.  No episodes since these changes.   H/o recurrent iritis - saw ophthalmology and rheumatology. Saw ophtho in follow up last week. Told did not need to return.  Vit D deficiency (16) started on vit D3 50k units weekly 06/2023.  Omeprazole 20mg  started several months ago for increasing GERD. Notes ongoing breakthrough symptoms related to diet.   Lab Results  Component Value Date   TSH 1.09 04/14/2023    Lab Results  Component Value Date   WBC 8.6 02/23/2023   HGB 15.1 (H) 02/23/2023   HCT 44.7 02/23/2023   MCV 92.5 02/23/2023   PLT 179.0 02/23/2023        Relevant past medical,  surgical, family and social history reviewed and updated as indicated. Interim medical history since our last visit reviewed. Allergies and medications reviewed and updated. Outpatient Medications Prior to Visit  Medication Sig Dispense Refill   Cholecalciferol (VITAMIN D3) 1.25 MG (50000 UT) CAPS Take 1 capsule (1.25 mg total) by mouth once a week. 12 capsule 3   EPINEPHrine 0.3 mg/0.3 mL IJ SOAJ injection Inject 0.3 mg into the muscle as needed for anaphylaxis. 2 each 1   norethindrone (CAMILA) 0.35 MG tablet Take 1 tablet (0.35 mg total) by mouth daily. 84 tablet 1   omeprazole (PRILOSEC OTC) 20 MG tablet Take 20 mg by mouth daily.     ondansetron (ZOFRAN-ODT) 4 MG disintegrating tablet Take 1 tablet (4 mg total) by mouth every 8 (eight) hours as needed for nausea or vomiting. 20 tablet 0   prednisoLONE acetate (PRED FORTE) 1 % ophthalmic suspension      No facility-administered medications prior to visit.     Per HPI unless specifically indicated in ROS section below Review of Systems  Objective:  BP 126/86   Pulse 88   Temp 98.7 F (37.1 C) (Oral)   Ht 5' 2.25" (1.581 m)   Wt 273 lb 6 oz (124 kg)   LMP 08/05/2023   SpO2 98%   BMI 49.60 kg/m   Wt Readings from  Last 3 Encounters:  08/19/23 273 lb 6 oz (124 kg)  07/08/23 272 lb 8 oz (123.6 kg)  05/01/23 274 lb (124.3 kg)      Physical Exam Vitals and nursing note reviewed.  Constitutional:      Appearance: Normal appearance. She is not ill-appearing.  HENT:     Mouth/Throat:     Mouth: Mucous membranes are moist.     Pharynx: Oropharynx is clear. No oropharyngeal exudate or posterior oropharyngeal erythema.  Eyes:     Extraocular Movements: Extraocular movements intact.     Conjunctiva/sclera: Conjunctivae normal.     Pupils: Pupils are equal, round, and reactive to light.  Neck:     Thyroid: No thyroid mass, thyromegaly or thyroid tenderness.  Cardiovascular:     Rate and Rhythm: Normal rate and regular rhythm.      Pulses: Normal pulses.     Heart sounds: Normal heart sounds. No murmur heard. Pulmonary:     Effort: Pulmonary effort is normal. No respiratory distress.     Breath sounds: Normal breath sounds. No wheezing, rhonchi or rales.  Musculoskeletal:     Cervical back: Normal range of motion and neck supple.     Right lower leg: No edema.     Left lower leg: No edema.  Skin:    General: Skin is warm and dry.     Coloration: Skin is not pale.     Findings: No rash.  Neurological:     Mental Status: She is alert.  Psychiatric:        Mood and Affect: Mood normal.        Behavior: Behavior normal.       Results for orders placed or performed in visit on 07/09/23  HM PAP SMEAR   Collection Time: 09/11/22 12:00 AM  Result Value Ref Range   HM Pap smear NILM    EKG - NSR rate 80 normal axis, intervals, no acute ST/T changes.   Assessment & Plan:   Problem List Items Addressed This Visit     Palpitations - Primary   Initial episode was more severe, associated with tachycardia to 120s lasting 1 minute, but more recently episodes are minimally symptomatic lasting only a few seconds at a time, not associated with fast heart beat.  She has recently cut down on caffeine intake and increased water intake and may have already noted benefit.  Reviewed recent labs - relatively recent normal TSH, CBC.  Suspect symptomatic cardiac ectopy. Discussed PRN metoprolol however doubt would be beneficial given short duration of episodes.  Recommend continue to monitor at this time, she will let me know if recurrence despite above caffeine/hydration changes and I will order Zio patch.  EKG reassuring today.       Relevant Orders   EKG 12-Lead (Completed)     No orders of the defined types were placed in this encounter.   Orders Placed This Encounter  Procedures   EKG 12-Lead    Patient Instructions  May take pepcid 20mg  at night as needed.  I agree with limiting caffeine, increasing water.   Let me know if recurrent symptoms to order 2 wk heart monitor (Zio patch).   Follow up plan: Return if symptoms worsen or fail to improve.  Eustaquio Boyden, MD

## 2023-08-24 ENCOUNTER — Ambulatory Visit: Payer: Commercial Managed Care - PPO | Admitting: Family Medicine

## 2023-09-24 ENCOUNTER — Other Ambulatory Visit: Payer: Self-pay

## 2023-09-24 DIAGNOSIS — Z01411 Encounter for gynecological examination (general) (routine) with abnormal findings: Secondary | ICD-10-CM | POA: Diagnosis not present

## 2023-09-24 DIAGNOSIS — Z1331 Encounter for screening for depression: Secondary | ICD-10-CM | POA: Diagnosis not present

## 2023-09-24 DIAGNOSIS — Z01419 Encounter for gynecological examination (general) (routine) without abnormal findings: Secondary | ICD-10-CM | POA: Diagnosis not present

## 2023-09-24 DIAGNOSIS — Z124 Encounter for screening for malignant neoplasm of cervix: Secondary | ICD-10-CM | POA: Diagnosis not present

## 2023-09-24 DIAGNOSIS — Z113 Encounter for screening for infections with a predominantly sexual mode of transmission: Secondary | ICD-10-CM | POA: Diagnosis not present

## 2023-09-24 MED ORDER — NORETHINDRONE 0.35 MG PO TABS
1.0000 | ORAL_TABLET | Freq: Every day | ORAL | 3 refills | Status: DC
  Filled 2023-09-24 – 2023-10-08 (×2): qty 84, 84d supply, fill #0
  Filled 2023-12-29: qty 84, 84d supply, fill #1
  Filled 2024-03-20: qty 84, 84d supply, fill #2
  Filled 2024-04-07 – 2024-05-31 (×2): qty 84, 84d supply, fill #0

## 2023-09-30 ENCOUNTER — Other Ambulatory Visit: Payer: Self-pay

## 2023-09-30 DIAGNOSIS — O10919 Unspecified pre-existing hypertension complicating pregnancy, unspecified trimester: Secondary | ICD-10-CM

## 2023-09-30 NOTE — Telephone Encounter (Signed)
 Called patient to review provider recommendations on results. Lab appointment has been scheduled.  Lab order placed for future.  There will be no charge to patient for this visit. Note of this made in appointment desk.

## 2023-10-01 ENCOUNTER — Other Ambulatory Visit

## 2023-10-01 DIAGNOSIS — Z8759 Personal history of other complications of pregnancy, childbirth and the puerperium: Secondary | ICD-10-CM

## 2023-10-01 DIAGNOSIS — O10919 Unspecified pre-existing hypertension complicating pregnancy, unspecified trimester: Secondary | ICD-10-CM

## 2023-10-02 LAB — MICROALBUMIN / CREATININE URINE RATIO
Creatinine,U: 23.3 mg/dL
Microalb Creat Ratio: UNDETERMINED mg/g (ref 0.0–30.0)
Microalb, Ur: <0.7 mg/dL

## 2023-10-05 ENCOUNTER — Encounter: Payer: Self-pay | Admitting: Family Medicine

## 2023-10-07 ENCOUNTER — Other Ambulatory Visit: Payer: Self-pay

## 2023-10-08 ENCOUNTER — Other Ambulatory Visit: Payer: Self-pay

## 2023-10-10 ENCOUNTER — Encounter: Payer: Self-pay | Admitting: Family Medicine

## 2023-12-25 DIAGNOSIS — H02882 Meibomian gland dysfunction right lower eyelid: Secondary | ICD-10-CM | POA: Diagnosis not present

## 2023-12-25 DIAGNOSIS — H02885 Meibomian gland dysfunction left lower eyelid: Secondary | ICD-10-CM | POA: Diagnosis not present

## 2023-12-25 DIAGNOSIS — H1045 Other chronic allergic conjunctivitis: Secondary | ICD-10-CM | POA: Diagnosis not present

## 2023-12-30 ENCOUNTER — Other Ambulatory Visit: Payer: Self-pay

## 2024-03-22 ENCOUNTER — Other Ambulatory Visit: Payer: Self-pay

## 2024-04-07 ENCOUNTER — Other Ambulatory Visit (HOSPITAL_COMMUNITY): Payer: Self-pay

## 2024-04-07 ENCOUNTER — Other Ambulatory Visit: Payer: Self-pay | Admitting: Family Medicine

## 2024-04-07 NOTE — Telephone Encounter (Signed)
 Called pt verified once weekly dose. Ok to fill?

## 2024-04-08 ENCOUNTER — Other Ambulatory Visit (HOSPITAL_COMMUNITY): Payer: Self-pay

## 2024-04-11 ENCOUNTER — Other Ambulatory Visit (HOSPITAL_COMMUNITY): Payer: Self-pay

## 2024-04-11 MED ORDER — VITAMIN D3 1.25 MG (50000 UT) PO CAPS
1.0000 | ORAL_CAPSULE | ORAL | 3 refills | Status: AC
  Filled 2024-04-11 – 2024-05-31 (×2): qty 12, 84d supply, fill #0

## 2024-05-03 ENCOUNTER — Ambulatory Visit (INDEPENDENT_AMBULATORY_CARE_PROVIDER_SITE_OTHER)

## 2024-05-03 DIAGNOSIS — Z23 Encounter for immunization: Secondary | ICD-10-CM | POA: Diagnosis not present

## 2024-05-03 NOTE — Progress Notes (Signed)
 Per orders of Dr. Anton Blas, injection of Flu Vaccine given by Nellie Hummer in left deltoid. Patient tolerated injection well.

## 2024-05-31 ENCOUNTER — Other Ambulatory Visit (HOSPITAL_COMMUNITY): Payer: Self-pay

## 2024-06-02 ENCOUNTER — Other Ambulatory Visit: Payer: Self-pay | Admitting: Family Medicine

## 2024-06-06 ENCOUNTER — Other Ambulatory Visit: Payer: Self-pay

## 2024-06-06 ENCOUNTER — Other Ambulatory Visit (HOSPITAL_COMMUNITY): Payer: Self-pay

## 2024-06-06 MED ORDER — EPINEPHRINE 0.3 MG/0.3ML IJ SOAJ
0.3000 mg | INTRAMUSCULAR | 1 refills | Status: AC | PRN
  Filled 2024-06-06: qty 2, 30d supply, fill #0

## 2024-06-18 ENCOUNTER — Encounter: Payer: Self-pay | Admitting: Family Medicine

## 2024-06-20 NOTE — Telephone Encounter (Signed)
 Please call patient to set up lab before cpe

## 2024-07-17 ENCOUNTER — Other Ambulatory Visit: Payer: Self-pay | Admitting: Family Medicine

## 2024-07-17 DIAGNOSIS — Z131 Encounter for screening for diabetes mellitus: Secondary | ICD-10-CM

## 2024-07-17 DIAGNOSIS — Z13220 Encounter for screening for lipoid disorders: Secondary | ICD-10-CM

## 2024-07-17 DIAGNOSIS — E559 Vitamin D deficiency, unspecified: Secondary | ICD-10-CM

## 2024-07-20 ENCOUNTER — Other Ambulatory Visit

## 2024-07-22 ENCOUNTER — Telehealth: Admitting: Physician Assistant

## 2024-07-22 DIAGNOSIS — R3989 Other symptoms and signs involving the genitourinary system: Secondary | ICD-10-CM

## 2024-07-22 MED ORDER — NITROFURANTOIN MONOHYD MACRO 100 MG PO CAPS: 100.0000 mg | ORAL_CAPSULE | Freq: Two times a day (BID) | ORAL | 0 refills | Status: AC

## 2024-07-22 NOTE — Progress Notes (Signed)

## 2024-07-27 ENCOUNTER — Encounter: Admitting: Family Medicine

## 2024-08-02 ENCOUNTER — Ambulatory Visit: Payer: Self-pay

## 2024-08-02 ENCOUNTER — Other Ambulatory Visit: Payer: Self-pay

## 2024-08-02 ENCOUNTER — Encounter: Payer: Self-pay | Admitting: Family Medicine

## 2024-08-02 ENCOUNTER — Encounter

## 2024-08-02 MED ORDER — OSELTAMIVIR PHOSPHATE 75 MG PO CAPS
75.0000 mg | ORAL_CAPSULE | Freq: Two times a day (BID) | ORAL | 0 refills | Status: AC
  Filled 2024-08-02 (×2): qty 10, 5d supply, fill #0

## 2024-08-02 NOTE — Telephone Encounter (Signed)
 See my chart message

## 2024-08-02 NOTE — Telephone Encounter (Signed)
 FYI Only or Action Required?: Action required by provider: clinical question for provider.  Patient was last seen in primary care on 08/19/2023 by Rilla Baller, MD.  Called Nurse Triage reporting Sinusitis, Cough, and Chills.  Symptoms began several days ago.  Interventions attempted: Rest, hydration, or home remedies.  Symptoms are: unchanged.  Triage Disposition: Home Care  Patient/caregiver understands and will follow disposition?: Yes   Reason for Disposition  [1] Probable influenza (fever) with no complications AND [2] NOT HIGH RISK  Answer Assessment - Initial Assessment Questions Patient states that she started to experience sinus drainage and headache on Saturday. On Sunday she also had itchy throat and did a home COVID and flu test which was negative. She started to experience cough yesterday and woke up with chills today so she tested again at home and was positive for flu. Home care advised. Patient is inquiring about whether or not she is in the window for or requiring tamiflu .   1. SYMPTOMS: What is your main symptom or concern? (e.g., cough, fever, shortness of breath, muscle aches)     Cough  2. ONSET: When did the symptoms start?      Saturday  3. COUGH: Do you have a cough? If Yes, ask: How bad is the cough?       Yes  4. FEVER: Do you have a fever? If Yes, ask: What is your temperature, how was it measured, and when did it start?     No fever, just chills  5. BREATHING DIFFICULTY: Are you having any difficulty breathing? (e.g., normal; shortness of breath, wheezing, unable to speak)      Denies SOB  6. BETTER-SAME-WORSE: Are you getting better, staying the same or getting worse compared to yesterday?  If getting worse, ask, In what way?     Worse  7. OTHER SYMPTOMS: Do you have any other symptoms?  (e.g., chills, fatigue, headache, loss of smell or taste, muscle pain, sore throat)     Chills, itchy throat, sinus headache  8.  INFLUENZA EXPOSURE: Was there any known exposure to influenza (flu) before the symptoms began?      Unknown  9. INFLUENZA SUSPECTED: Why do you think you have influenza? (e.g., positive flu self-test at home, symptoms after exposure).     Positive self test  10. INFLUENZA VACCINE: Have you had the flu vaccine? If Yes, ask: When did you last get it?       Unknown  11. HIGH RISK FOR COMPLICATIONS: Do you have any chronic medical problems? (e.g., asthma, heart or lung disease, obesity, weak immune system)       No  12. PREGNANCY: Is there any chance you are pregnant? When was your last menstrual period?       Unknown  13. O2 SATURATION MONITOR:  Do you use an oxygen saturation monitor (pulse oximeter) at home? If Yes, ask What is your reading (oxygen level) today? What is your usual oxygen saturation reading? (e.g., 95%)       No  Protocols used: Influenza (Flu) Suspected-A-AH  Reason for Triage: Patient calling because she tested positive from home flu kit this morning. Symptoms started Saturday night. Experiencing head cold symptoms. Wondering if she is still in the window for Tami-flu.   Call back # [phone]

## 2024-08-02 NOTE — Telephone Encounter (Signed)
 Replied via my chart.

## 2024-08-02 NOTE — Telephone Encounter (Signed)
" °  FYI Only or Action Required?: FYI only for provider: appointment scheduled on 08/03/24.  Patient was last seen in primary care on 08/19/2023 by Alisha Baller, MD.  Called Nurse Triage reporting Sinusitis, Cough, and Shortness of Breath.  Symptoms began 3 days ago.  Interventions attempted: OTC medications: Tylenol .  Symptoms are: gradually worsening.  Triage Disposition: See Physician Within 24 Hours  Patient/caregiver understands and will follow disposition?: Yes                    Copied from CRM #[id number]. Topic: Clinical - Red Word Triage >> Aug 02, 2024  9:55 AM Eva FALCON wrote: Red Word that prompted transfer to Nurse Triage: started Saturday with sinus drainage, pressure on right side of face, severe cough, cough so deep in chest she is having some pain. Reason for Disposition  SEVERE coughing spells (e.g., whooping sound after coughing, vomiting after coughing)  Answer Assessment - Initial Assessment Questions Symptoms started Saturday with post nasal drip and sinus pain. Sunday congestion moved to chest and coughing noted. Worsening symptoms.   1. ONSET: When did the cough begin?      Sunday.  2. SEVERITY: How bad is the cough today?      Severe, deep coughing fits.  3. SPUTUM: Describe the color of your sputum (e.g., none, dry cough; clear, white, yellow, green)     Productive but unsure color, not spitting out the mucous. 4. HEMOPTYSIS: Are you coughing up any blood? If Yes, ask: How much? (e.g., flecks, streaks, tablespoons, etc.)     No.  5. DIFFICULTY BREATHING: Are you having difficulty breathing? If Yes, ask: How bad is it? (e.g., mild, moderate, severe)      Yes, SOB with strenuous activity. No SOB at rest or talking.  6. FEVER: Do you have a fever? If Yes, ask: What is your temperature, how was it measured, and when did it start?     Unsure. 7. CARDIAC HISTORY: Do you have any history of heart disease? (e.g., heart  attack, congestive heart failure)      No. 8. LUNG HISTORY: Do you have any history of lung disease?  (e.g., pulmonary embolus, asthma, emphysema)     No. 9. PE RISK FACTORS: Do you have a history of blood clots? (or: recent major surgery, recent prolonged travel, bedridden)     No. 10. OTHER SYMPTOMS: Do you have any other symptoms? (e.g., runny nose, wheezing, chest pain)       Chest pain from coughing, post nasal drip, 5/10 right sided facial/sinus (forehead, cheek, teeth) pain, right ear congestion, tickle in throat. No wheezing, earache.  11. PREGNANCY: Is there any chance you are pregnant? When was your last menstrual period?       LMP 3 weeks ago.  12. TRAVEL: Have you traveled out of the country in the last month? (e.g., travel history, exposures)       No travel. Unsure what illness she was exposed to but went to the eye doctor and the staff member who examined her was ill.  Protocols used: Cough - Acute Productive-A-AH  "

## 2024-08-03 ENCOUNTER — Ambulatory Visit

## 2024-08-04 ENCOUNTER — Other Ambulatory Visit (HOSPITAL_COMMUNITY): Payer: Self-pay

## 2024-08-16 ENCOUNTER — Ambulatory Visit (INDEPENDENT_AMBULATORY_CARE_PROVIDER_SITE_OTHER)

## 2024-08-16 ENCOUNTER — Encounter: Payer: Self-pay | Admitting: Emergency Medicine

## 2024-08-16 ENCOUNTER — Ambulatory Visit: Payer: Self-pay

## 2024-08-16 ENCOUNTER — Ambulatory Visit
Admission: EM | Admit: 2024-08-16 | Discharge: 2024-08-16 | Disposition: A | Discharge status: Home / Self Care | Source: Home / Self Care

## 2024-08-16 ENCOUNTER — Ambulatory Visit

## 2024-08-16 ENCOUNTER — Telehealth: Payer: Self-pay | Admitting: Emergency Medicine

## 2024-08-16 DIAGNOSIS — M79641 Pain in right hand: Secondary | ICD-10-CM

## 2024-08-16 NOTE — ED Triage Notes (Signed)
 Patient reports that was in shower this morning and hit her right hand on shower door. Patient now complains of pain and swelling to right hand and radiates to right wrist area. Rates pain 5/10. Patient has not taking anything for symptoms.

## 2024-08-16 NOTE — Telephone Encounter (Signed)
 FYI Only or Action Required?: Action required by provider: update on patient condition.  Patient was last seen in primary care on 08/19/2023 by Rilla Baller, MD.  Called Nurse Triage reporting Hand Injury.  Symptoms began today.  Interventions attempted: Nothing.  Symptoms are: gradually worsening.  Triage Disposition: See HCP Within 4 Hours (Or PCP Triage)  Patient/caregiver understands and will follow disposition?: Yes Message from [facility] sent at 08/16/2024 10:51 AM EST  Reason for Triage: Patient stated that she was in the shower and hit her hand and might have broken it. She stated its swollen and red   Reason for Disposition  [1] SEVERE pain (e.g., excruciating, unable to use hand at all) AND [2] not improved 2 hours after pain medicine/ice packs  Answer Assessment - Initial Assessment Questions 1. MECHANISM: How did the injury happen?     Hit right hand on shower door- pain to ring and pinky finger, top of hand and extending into wrist.  2. ONSET: When did the injury happen? (e.g., minutes, hours ago)      Today, 30 minutes before call 3. APPEARANCE of INJURY: What does the injury look like?      swollen 4. SEVERITY: Can you use your hand normally? Can you bend your fingers into a ball and then fully open them?     Lacks full range of motion 5. SIZE: For cuts, bruises, or swelling, ask: How large is it? (e.g., inches or centimeters; entire hand)      No broken skin 6. PAIN: How bad is the pain? (Scale 0-10; or none, mild, moderate, severe)     7/10 at rest  Protocols used: Hand Injury-A-AH
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
## 2024-08-16 NOTE — Telephone Encounter (Signed)
 Reported x-ray results via telephone, 2 patient identifiers used

## 2024-08-16 NOTE — Discharge Instructions (Addendum)
 X-ray is pending and you will be notified of results by telephone  If there is a break in the bone (fracture) then we will have you return to the clinic to have a  splint placed for stability and support, in the meantime you may wrap an Ace bandage if tolerable  Begin use of ibuprofen  or Tylenol  as needed for pain  May use ice over the affected area in 10 to 15-minute intervals, may use heat after 24 hours if you find more comforting  Elevate hand on the pillows to help reduce swelling  If there is a break in the bone you will follow-up with orthopedics information listed on front

## 2024-08-17 ENCOUNTER — Ambulatory Visit: Admitting: Family Medicine

## 2024-08-17 NOTE — Telephone Encounter (Signed)
 Appreciate [facility] seeing pt. Xray without fracture

## 2024-08-17 NOTE — Telephone Encounter (Signed)
"  Patient was seen at [facility].  "

## 2024-08-19 ENCOUNTER — Other Ambulatory Visit (HOSPITAL_COMMUNITY): Payer: Self-pay

## 2024-08-19 MED ORDER — NORETHINDRONE 0.35 MG PO TABS
1.0000 | ORAL_TABLET | Freq: Every day | ORAL | 0 refills | Status: AC
  Filled 2024-08-19: qty 84, 84d supply, fill #0

## 2024-08-20 ENCOUNTER — Other Ambulatory Visit (HOSPITAL_COMMUNITY): Payer: Self-pay

## 2024-09-20 ENCOUNTER — Other Ambulatory Visit

## 2024-09-28 ENCOUNTER — Encounter: Admitting: Family Medicine
